# Patient Record
Sex: Female | Born: 1950 | Race: Black or African American | Hispanic: No | Marital: Married | Smoking: Never smoker
Health system: Southern US, Community
[De-identification: ages and names within clinical notes are randomized; demographics above are authoritative.]

## PROBLEM LIST (undated history)

## (undated) DIAGNOSIS — I10 Essential (primary) hypertension: Secondary | ICD-10-CM

## (undated) DIAGNOSIS — I4891 Unspecified atrial fibrillation: Secondary | ICD-10-CM

## (undated) DIAGNOSIS — Z952 Presence of prosthetic heart valve: Secondary | ICD-10-CM

## (undated) DIAGNOSIS — R011 Cardiac murmur, unspecified: Secondary | ICD-10-CM

## (undated) DIAGNOSIS — I517 Cardiomegaly: Secondary | ICD-10-CM

## (undated) DIAGNOSIS — I5022 Chronic systolic (congestive) heart failure: Secondary | ICD-10-CM

## (undated) DIAGNOSIS — I00 Rheumatic fever without heart involvement: Secondary | ICD-10-CM

## (undated) DIAGNOSIS — Z8619 Personal history of other infectious and parasitic diseases: Secondary | ICD-10-CM

## (undated) DIAGNOSIS — I513 Intracardiac thrombosis, not elsewhere classified: Secondary | ICD-10-CM

## (undated) HISTORY — DX: Intracardiac thrombosis, not elsewhere classified: I51.3

## (undated) HISTORY — DX: Rheumatic fever without heart involvement: I00

## (undated) HISTORY — DX: Chronic systolic (congestive) heart failure: I50.22

## (undated) HISTORY — DX: Essential (primary) hypertension: I10

## (undated) HISTORY — DX: Personal history of other infectious and parasitic diseases: Z86.19

## (undated) HISTORY — DX: Presence of prosthetic heart valve: Z95.2

## (undated) HISTORY — DX: Cardiomegaly: I51.7

---

## 1992-04-12 DIAGNOSIS — Z952 Presence of prosthetic heart valve: Secondary | ICD-10-CM

## 1992-04-12 HISTORY — PX: MITRAL VALVE REPLACEMENT: SHX147

## 1992-04-12 HISTORY — DX: Presence of prosthetic heart valve: Z95.2

## 1997-10-15 ENCOUNTER — Ambulatory Visit: Admission: RE | Admit: 1997-10-15 | Discharge: 1997-10-15 | Payer: Self-pay

## 1997-11-14 ENCOUNTER — Encounter (HOSPITAL_COMMUNITY): Admission: RE | Admit: 1997-11-14 | Discharge: 1998-02-06 | Payer: Self-pay

## 1998-02-06 ENCOUNTER — Encounter (HOSPITAL_COMMUNITY): Admission: RE | Admit: 1998-02-06 | Discharge: 1998-05-01 | Payer: Self-pay

## 1998-05-01 ENCOUNTER — Encounter (HOSPITAL_COMMUNITY): Admission: RE | Admit: 1998-05-01 | Discharge: 1998-07-30 | Payer: Self-pay

## 1998-08-31 ENCOUNTER — Inpatient Hospital Stay (HOSPITAL_COMMUNITY): Admission: EM | Admit: 1998-08-31 | Discharge: 1998-09-05 | Payer: Self-pay | Admitting: Cardiology

## 2001-01-02 ENCOUNTER — Inpatient Hospital Stay (HOSPITAL_COMMUNITY): Admission: RE | Admit: 2001-01-02 | Discharge: 2001-01-06 | Payer: Self-pay | Admitting: Oral and Maxillofacial Surgery

## 2001-01-02 ENCOUNTER — Encounter: Payer: Self-pay | Admitting: Cardiology

## 2005-11-15 ENCOUNTER — Ambulatory Visit: Payer: Self-pay | Admitting: Internal Medicine

## 2005-11-30 ENCOUNTER — Ambulatory Visit: Payer: Self-pay | Admitting: Internal Medicine

## 2005-12-15 ENCOUNTER — Ambulatory Visit: Payer: Self-pay | Admitting: Internal Medicine

## 2006-01-14 ENCOUNTER — Ambulatory Visit: Payer: Self-pay | Admitting: Internal Medicine

## 2006-02-15 ENCOUNTER — Ambulatory Visit: Payer: Self-pay | Admitting: Internal Medicine

## 2006-03-02 ENCOUNTER — Ambulatory Visit: Payer: Self-pay | Admitting: Internal Medicine

## 2006-03-31 ENCOUNTER — Ambulatory Visit: Payer: Self-pay | Admitting: Internal Medicine

## 2006-05-03 ENCOUNTER — Ambulatory Visit: Payer: Self-pay | Admitting: Internal Medicine

## 2006-05-18 ENCOUNTER — Ambulatory Visit: Payer: Self-pay | Admitting: Internal Medicine

## 2006-06-16 ENCOUNTER — Ambulatory Visit: Payer: Self-pay | Admitting: Internal Medicine

## 2006-07-20 ENCOUNTER — Ambulatory Visit: Payer: Self-pay | Admitting: Internal Medicine

## 2006-08-03 ENCOUNTER — Ambulatory Visit: Payer: Self-pay | Admitting: Internal Medicine

## 2006-08-29 ENCOUNTER — Ambulatory Visit: Payer: Self-pay | Admitting: Internal Medicine

## 2006-09-26 ENCOUNTER — Encounter: Payer: Self-pay | Admitting: Internal Medicine

## 2006-09-26 DIAGNOSIS — I10 Essential (primary) hypertension: Secondary | ICD-10-CM | POA: Insufficient documentation

## 2006-09-26 DIAGNOSIS — Z8679 Personal history of other diseases of the circulatory system: Secondary | ICD-10-CM

## 2006-09-28 ENCOUNTER — Ambulatory Visit: Payer: Self-pay | Admitting: Internal Medicine

## 2006-10-26 ENCOUNTER — Ambulatory Visit: Payer: Self-pay | Admitting: Internal Medicine

## 2006-11-03 ENCOUNTER — Telehealth: Payer: Self-pay | Admitting: Internal Medicine

## 2006-11-09 ENCOUNTER — Ambulatory Visit: Payer: Self-pay | Admitting: Internal Medicine

## 2006-11-09 DIAGNOSIS — Z954 Presence of other heart-valve replacement: Secondary | ICD-10-CM | POA: Insufficient documentation

## 2006-11-09 LAB — CONVERTED CEMR LAB
INR: 2.5
Prothrombin Time: 19.1 s

## 2006-11-30 ENCOUNTER — Ambulatory Visit: Payer: Self-pay | Admitting: Internal Medicine

## 2006-11-30 LAB — CONVERTED CEMR LAB
INR: 3
Prothrombin Time: 20.9 s

## 2007-01-03 ENCOUNTER — Ambulatory Visit: Payer: Self-pay | Admitting: Internal Medicine

## 2007-01-03 LAB — CONVERTED CEMR LAB
INR: 2.5
Prothrombin Time: 19.2 s

## 2007-01-31 ENCOUNTER — Ambulatory Visit: Payer: Self-pay | Admitting: Internal Medicine

## 2007-01-31 LAB — CONVERTED CEMR LAB
INR: 2.1
Prothrombin Time: 17.8 s

## 2007-02-13 ENCOUNTER — Ambulatory Visit: Payer: Self-pay | Admitting: Internal Medicine

## 2007-02-13 LAB — CONVERTED CEMR LAB
INR: 2.6
Prothrombin Time: 19.4 s

## 2007-03-08 ENCOUNTER — Ambulatory Visit: Payer: Self-pay | Admitting: Internal Medicine

## 2007-03-08 LAB — CONVERTED CEMR LAB
INR: 3.4
Prothrombin Time: 22.2 s

## 2007-03-14 ENCOUNTER — Telehealth: Payer: Self-pay | Admitting: Internal Medicine

## 2007-04-10 ENCOUNTER — Ambulatory Visit: Payer: Self-pay | Admitting: Internal Medicine

## 2007-04-10 LAB — CONVERTED CEMR LAB
INR: 3.3
Prothrombin Time: 22 s

## 2007-05-08 ENCOUNTER — Ambulatory Visit: Payer: Self-pay | Admitting: Internal Medicine

## 2007-05-08 LAB — CONVERTED CEMR LAB
INR: 3.6
Prothrombin Time: 23 s

## 2007-05-09 ENCOUNTER — Telehealth (INDEPENDENT_AMBULATORY_CARE_PROVIDER_SITE_OTHER): Payer: Self-pay | Admitting: *Deleted

## 2007-05-11 ENCOUNTER — Ambulatory Visit: Payer: Self-pay | Admitting: Internal Medicine

## 2007-05-22 ENCOUNTER — Ambulatory Visit: Payer: Self-pay | Admitting: Internal Medicine

## 2007-05-22 LAB — CONVERTED CEMR LAB
INR: 4
Prothrombin Time: 24.4 s

## 2007-05-23 ENCOUNTER — Encounter: Payer: Self-pay | Admitting: Internal Medicine

## 2007-05-23 ENCOUNTER — Ambulatory Visit: Payer: Self-pay

## 2007-06-02 ENCOUNTER — Ambulatory Visit: Payer: Self-pay | Admitting: Internal Medicine

## 2007-06-02 LAB — CONVERTED CEMR LAB
INR: 3.6
Prothrombin Time: 23 s

## 2007-06-13 ENCOUNTER — Ambulatory Visit: Payer: Self-pay | Admitting: Internal Medicine

## 2007-06-13 LAB — CONVERTED CEMR LAB
Calcium: 8.8 mg/dL (ref 8.4–10.5)
Chloride: 105 meq/L (ref 96–112)
GFR calc Af Amer: 111 mL/min
GFR calc non Af Amer: 91 mL/min
HDL: 32.1 mg/dL — ABNORMAL LOW (ref >39.0)
LDL Cholesterol: 115 mg/dL — ABNORMAL HIGH (ref 0–99)
Pro B Natriuretic peptide (BNP): 159 pg/mL — ABNORMAL HIGH (ref 0.0–100.0)
Sodium: 140 meq/L (ref 135–145)
Total CHOL/HDL Ratio: 5.5
VLDL: 30 mg/dL (ref 0–40)

## 2007-06-27 ENCOUNTER — Ambulatory Visit: Payer: Self-pay | Admitting: Internal Medicine

## 2007-06-27 LAB — CONVERTED CEMR LAB: Prothrombin Time: 23.7 s

## 2007-07-11 ENCOUNTER — Ambulatory Visit: Payer: Self-pay | Admitting: Internal Medicine

## 2007-07-11 LAB — CONVERTED CEMR LAB
INR: 3.4
Prothrombin Time: 22.4 s

## 2007-08-08 ENCOUNTER — Ambulatory Visit: Payer: Self-pay | Admitting: Internal Medicine

## 2007-08-08 LAB — CONVERTED CEMR LAB: Prothrombin Time: 17.7 s

## 2007-08-21 ENCOUNTER — Ambulatory Visit: Payer: Self-pay | Admitting: Internal Medicine

## 2007-09-21 ENCOUNTER — Ambulatory Visit: Payer: Self-pay | Admitting: Internal Medicine

## 2007-09-21 LAB — CONVERTED CEMR LAB
INR: 1.8
Prothrombin Time: 16.5 s

## 2007-09-26 ENCOUNTER — Ambulatory Visit: Payer: Self-pay | Admitting: Licensed Clinical Social Worker

## 2007-10-03 ENCOUNTER — Ambulatory Visit: Payer: Self-pay | Admitting: Licensed Clinical Social Worker

## 2007-10-09 ENCOUNTER — Ambulatory Visit: Payer: Self-pay | Admitting: Licensed Clinical Social Worker

## 2007-10-17 ENCOUNTER — Ambulatory Visit: Payer: Self-pay | Admitting: Licensed Clinical Social Worker

## 2007-10-19 ENCOUNTER — Ambulatory Visit: Payer: Self-pay | Admitting: Internal Medicine

## 2007-10-19 LAB — CONVERTED CEMR LAB: Prothrombin Time: 17.7 s

## 2007-10-24 ENCOUNTER — Ambulatory Visit: Payer: Self-pay | Admitting: Licensed Clinical Social Worker

## 2007-11-14 ENCOUNTER — Ambulatory Visit: Payer: Self-pay | Admitting: Licensed Clinical Social Worker

## 2007-11-21 ENCOUNTER — Ambulatory Visit: Payer: Self-pay | Admitting: Internal Medicine

## 2007-11-21 LAB — CONVERTED CEMR LAB: INR: 1.7

## 2007-12-12 ENCOUNTER — Ambulatory Visit: Payer: Self-pay | Admitting: Licensed Clinical Social Worker

## 2007-12-19 ENCOUNTER — Ambulatory Visit: Payer: Self-pay | Admitting: Internal Medicine

## 2008-01-16 ENCOUNTER — Ambulatory Visit: Payer: Self-pay | Admitting: Internal Medicine

## 2008-01-19 ENCOUNTER — Ambulatory Visit: Payer: Self-pay | Admitting: Licensed Clinical Social Worker

## 2008-02-13 ENCOUNTER — Ambulatory Visit: Payer: Self-pay | Admitting: Internal Medicine

## 2008-02-13 DIAGNOSIS — I059 Rheumatic mitral valve disease, unspecified: Secondary | ICD-10-CM

## 2008-02-13 LAB — CONVERTED CEMR LAB
INR: 1.8
Prothrombin Time: 16.5 s

## 2008-02-27 ENCOUNTER — Ambulatory Visit: Payer: Self-pay | Admitting: Internal Medicine

## 2008-02-27 LAB — CONVERTED CEMR LAB
INR: 2.2
Prothrombin Time: 18.3 s

## 2008-03-19 ENCOUNTER — Ambulatory Visit: Payer: Self-pay | Admitting: Internal Medicine

## 2008-03-19 LAB — CONVERTED CEMR LAB
INR: 2.2
Prothrombin Time: 18.1 s

## 2008-04-10 ENCOUNTER — Ambulatory Visit: Payer: Self-pay | Admitting: Internal Medicine

## 2008-04-15 ENCOUNTER — Encounter: Payer: Self-pay | Admitting: Internal Medicine

## 2008-05-09 ENCOUNTER — Ambulatory Visit: Payer: Self-pay | Admitting: Internal Medicine

## 2008-06-11 ENCOUNTER — Ambulatory Visit: Payer: Self-pay | Admitting: Internal Medicine

## 2008-07-19 ENCOUNTER — Ambulatory Visit: Payer: Self-pay | Admitting: Internal Medicine

## 2008-07-19 LAB — CONVERTED CEMR LAB
INR: 2.6
Prothrombin Time: 19.7 s

## 2008-08-20 ENCOUNTER — Ambulatory Visit: Payer: Self-pay | Admitting: Internal Medicine

## 2008-08-20 LAB — CONVERTED CEMR LAB
INR: 2.3
Prothrombin Time: 18.7 s

## 2008-09-12 ENCOUNTER — Ambulatory Visit: Payer: Self-pay | Admitting: Internal Medicine

## 2008-09-12 LAB — CONVERTED CEMR LAB: Prothrombin Time: 13.5 s

## 2008-09-20 ENCOUNTER — Ambulatory Visit: Payer: Self-pay | Admitting: Internal Medicine

## 2008-10-04 ENCOUNTER — Ambulatory Visit: Payer: Self-pay | Admitting: Internal Medicine

## 2008-10-04 LAB — CONVERTED CEMR LAB
Alkaline Phosphatase: 61 units/L (ref 39–117)
Basophils Absolute: 0 10*3/uL (ref 0.0–0.1)
Basophils Relative: 0.2 % (ref 0.0–3.0)
Bilirubin Urine: NEGATIVE
Bilirubin, Direct: 0.1 mg/dL (ref 0.0–0.3)
CO2: 29 meq/L (ref 19–32)
Calcium: 8.9 mg/dL (ref 8.4–10.5)
Cholesterol: 239 mg/dL — ABNORMAL HIGH (ref 0–200)
Creatinine, Ser: 0.6 mg/dL (ref 0.4–1.2)
Eosinophils Absolute: 0.1 10*3/uL (ref 0.0–0.7)
Ketones, urine, test strip: NEGATIVE
Lymphocytes Relative: 39.8 % (ref 12.0–46.0)
MCHC: 34 g/dL (ref 30.0–36.0)
Neutrophils Relative %: 48.8 % (ref 43.0–77.0)
Prothrombin Time: 17.6 s
RBC: 4.21 M/uL (ref 3.87–5.11)
RDW: 12.4 % (ref 11.5–14.6)
Total Bilirubin: 1.1 mg/dL (ref 0.3–1.2)
Total CHOL/HDL Ratio: 5
Triglycerides: 95 mg/dL (ref 0.0–149.0)
Urobilinogen, UA: 0.2

## 2008-10-11 ENCOUNTER — Ambulatory Visit: Payer: Self-pay | Admitting: Internal Medicine

## 2008-10-11 ENCOUNTER — Encounter: Payer: Self-pay | Admitting: *Deleted

## 2008-10-11 DIAGNOSIS — E785 Hyperlipidemia, unspecified: Secondary | ICD-10-CM

## 2008-10-11 LAB — CONVERTED CEMR LAB
INR: 2.8
Prothrombin Time: 20.2 s

## 2008-11-05 ENCOUNTER — Ambulatory Visit: Payer: Self-pay | Admitting: Internal Medicine

## 2008-11-06 ENCOUNTER — Encounter: Payer: Self-pay | Admitting: *Deleted

## 2008-11-06 LAB — CONVERTED CEMR LAB: Fecal Occult Bld: NEGATIVE

## 2008-11-08 ENCOUNTER — Ambulatory Visit: Payer: Self-pay | Admitting: Internal Medicine

## 2008-11-08 LAB — CONVERTED CEMR LAB: Prothrombin Time: 18.1 s

## 2008-11-22 ENCOUNTER — Ambulatory Visit: Payer: Self-pay | Admitting: Internal Medicine

## 2008-12-20 ENCOUNTER — Ambulatory Visit: Payer: Self-pay | Admitting: Internal Medicine

## 2008-12-20 LAB — CONVERTED CEMR LAB
INR: 2.3
Prothrombin Time: 18.4 s

## 2009-01-27 ENCOUNTER — Ambulatory Visit: Payer: Self-pay | Admitting: Internal Medicine

## 2009-01-27 LAB — CONVERTED CEMR LAB: INR: 2.8

## 2009-02-10 ENCOUNTER — Ambulatory Visit: Payer: Self-pay | Admitting: Internal Medicine

## 2009-02-17 ENCOUNTER — Telehealth: Payer: Self-pay | Admitting: *Deleted

## 2009-03-03 ENCOUNTER — Ambulatory Visit: Payer: Self-pay | Admitting: Internal Medicine

## 2009-03-03 LAB — CONVERTED CEMR LAB: Prothrombin Time: 16.4 s

## 2009-04-03 ENCOUNTER — Ambulatory Visit: Payer: Self-pay | Admitting: Internal Medicine

## 2009-04-24 ENCOUNTER — Ambulatory Visit: Payer: Self-pay | Admitting: Internal Medicine

## 2009-04-24 LAB — CONVERTED CEMR LAB: Prothrombin Time: 21.2 s

## 2009-05-29 ENCOUNTER — Ambulatory Visit: Payer: Self-pay | Admitting: Internal Medicine

## 2009-05-29 LAB — CONVERTED CEMR LAB
INR: 2.5
Prothrombin Time: 19.3 s

## 2009-06-26 ENCOUNTER — Ambulatory Visit: Payer: Self-pay | Admitting: Internal Medicine

## 2009-06-26 LAB — CONVERTED CEMR LAB

## 2009-07-25 ENCOUNTER — Ambulatory Visit: Payer: Self-pay | Admitting: Internal Medicine

## 2009-08-22 ENCOUNTER — Ambulatory Visit: Payer: Self-pay | Admitting: Internal Medicine

## 2009-08-22 LAB — CONVERTED CEMR LAB: INR: 1.2

## 2009-08-25 ENCOUNTER — Ambulatory Visit: Payer: Self-pay | Admitting: Internal Medicine

## 2009-08-25 LAB — CONVERTED CEMR LAB
INR: 2
Prothrombin Time: 17.5 s

## 2009-09-02 ENCOUNTER — Ambulatory Visit: Payer: Self-pay | Admitting: Internal Medicine

## 2009-09-02 LAB — CONVERTED CEMR LAB
INR: 3.2
Prothrombin Time: 21.6 s

## 2009-09-30 ENCOUNTER — Ambulatory Visit: Payer: Self-pay | Admitting: Internal Medicine

## 2009-10-28 ENCOUNTER — Ambulatory Visit: Payer: Self-pay | Admitting: Internal Medicine

## 2009-12-01 ENCOUNTER — Ambulatory Visit: Payer: Self-pay | Admitting: Internal Medicine

## 2009-12-29 ENCOUNTER — Ambulatory Visit: Payer: Self-pay | Admitting: Internal Medicine

## 2010-01-07 LAB — CONVERTED CEMR LAB
Albumin: 3.8 g/dL (ref 3.5–5.2)
Alkaline Phosphatase: 57 units/L (ref 39–117)
BUN: 11 mg/dL (ref 6–23)
Basophils Absolute: 0.1 10*3/uL (ref 0.0–0.1)
Bilirubin Urine: NEGATIVE
Bilirubin, Direct: 0.1 mg/dL (ref 0.0–0.3)
CO2: 31 meq/L (ref 19–32)
Calcium: 9.3 mg/dL (ref 8.4–10.5)
Cholesterol: 251 mg/dL — ABNORMAL HIGH (ref 0–200)
Creatinine, Ser: 0.6 mg/dL (ref 0.4–1.2)
Eosinophils Absolute: 0.1 10*3/uL (ref 0.0–0.7)
Glucose, Bld: 86 mg/dL (ref 70–99)
HDL: 54.2 mg/dL (ref >39.00)
Ketones, ur: NEGATIVE mg/dL
Lymphocytes Relative: 36.6 % (ref 12.0–46.0)
MCHC: 33.8 g/dL (ref 30.0–36.0)
MCV: 94 fL (ref 78.0–100.0)
Monocytes Absolute: 0.6 10*3/uL (ref 0.1–1.0)
Neutrophils Relative %: 53.4 % (ref 43.0–77.0)
Nitrite: NEGATIVE
RDW: 13.4 % (ref 11.5–14.6)
Total CHOL/HDL Ratio: 5
Total Protein, Urine: NEGATIVE mg/dL
Triglycerides: 107 mg/dL (ref 0.0–149.0)
pH: 6 (ref 5.0–8.0)

## 2010-01-13 ENCOUNTER — Ambulatory Visit: Payer: Self-pay | Admitting: Internal Medicine

## 2010-01-20 ENCOUNTER — Ambulatory Visit: Payer: Self-pay | Admitting: Internal Medicine

## 2010-01-22 ENCOUNTER — Ambulatory Visit: Payer: Self-pay | Admitting: Internal Medicine

## 2010-01-22 LAB — CONVERTED CEMR LAB
Nitrite: NEGATIVE
Specific Gravity, Urine: 1.01 (ref 1.000–1.030)
Urine Glucose: NEGATIVE mg/dL
Urobilinogen, UA: 0.2 (ref 0.0–1.0)

## 2010-01-23 ENCOUNTER — Encounter: Payer: Self-pay | Admitting: Internal Medicine

## 2010-02-16 ENCOUNTER — Encounter: Payer: Self-pay | Admitting: *Deleted

## 2010-02-26 ENCOUNTER — Telehealth: Payer: Self-pay | Admitting: *Deleted

## 2010-03-13 ENCOUNTER — Ambulatory Visit: Payer: Self-pay | Admitting: Internal Medicine

## 2010-03-13 LAB — CONVERTED CEMR LAB: INR: 1.9

## 2010-03-27 ENCOUNTER — Ambulatory Visit: Payer: Self-pay | Admitting: Internal Medicine

## 2010-04-14 ENCOUNTER — Ambulatory Visit
Admission: RE | Admit: 2010-04-14 | Discharge: 2010-04-14 | Payer: Self-pay | Source: Home / Self Care | Attending: Internal Medicine | Admitting: Internal Medicine

## 2010-05-02 ENCOUNTER — Encounter: Payer: Self-pay | Admitting: Gastroenterology

## 2010-05-12 NOTE — Assessment & Plan Note (Signed)
Summary: PT/CJR   Nurse Visit   Allergies: 1)  Sulfamethoxazole (Sulfamethoxazole) Laboratory Results   Blood Tests   Date/Time Received: October 28, 2009 4:30 PM  Date/Time Reported: October 28, 2009 4:30 PM    INR: 3.4   (Normal Range: 0.88-1.12   Therap INR: 2.0-3.5) Comments: Wynona Canes, CMA  October 28, 2009 4:30 PM     Orders Added: 1)  Fingerstick [36416] 2)  Protime [[id number]]  Laboratory Results   Blood Tests      INR: 3.4   (Normal Range: 0.88-1.12   Therap INR: 2.0-3.5) Comments: Wynona Canes, CMA  October 28, 2009 4:30 PM       ANTICOAGULATION RECORD PREVIOUS REGIMEN & LAB RESULTS Anticoagulation Diagnosis:  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 on  05/09/2008  NEW REGIMEN & LAB RESULTS Current INR: 3.4 Regimen: Same Dose  (no change)       Repeat testing in: 4 weeks MEDICATIONS COUMADIN 5 MG TABS (WARFARIN SODIUM) Take 1.5 tabs once daily or as directed COUMADIN 7.5 MG TABS (WARFARIN SODIUM) Take as directed DIOVAN HCT 80-12.5 MG TABS (VALSARTAN-HYDROCHLOROTHIAZIDE) 1 by mouth once daily AMOXICILLIN 500 MG  CAPS (AMOXICILLIN) take #4     1 hour pre procedure. COUMADIN 10 MG TABS (WARFARIN SODIUM) as directed   Anticoagulation Visit Questionnaire      Coumadin dose missed/changed:  No      Abnormal Bleeding Symptoms:  No   Any diet changes including alcohol intake, vegetables or greens since the last visit:  No Any illnesses or hospitalizations since the last visit:  No Any signs of clotting since the last visit (including chest discomfort, dizziness, shortness of breath, arm tingling, slurred speech, swelling or redness in leg):  No

## 2010-05-12 NOTE — Assessment & Plan Note (Signed)
Summary: pt//ccm   Nurse Visit   Allergies: 1)  Sulfamethoxazole (Sulfamethoxazole) Laboratory Results   Blood Tests   Date/Time Received: Aug 18, 2009 4:56 PM  Date/Time Reported: Aug 18, 2009 4:56 PM   PT: 19.2 s   (Normal Range: 10.6-13.4)  INR: 2.5   (Normal Range: 0.88-1.12   Therap INR: 2.0-3.5) Comments: Wynona Canes, CMA  Aug 18, 2009 4:56 PM      Orders Added: 1)  Est. 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 7.5mg  other days on  05/29/2009 Previous Regimen:  same on  04/24/2009 Previous Coagulation Comments:  Take 10mg  only for 2 days, then 7.5mg  once daily. on  05/09/2008  NEW REGIMEN & LAB RESULTS Current INR: 2.5 Regimen: same  (no change)       Repeat testing in: 4 weeks MEDICATIONS COUMADIN 5 MG TABS (WARFARIN SODIUM) Take 1.5 tabs once daily or as directed COUMADIN 7.5 MG TABS (WARFARIN SODIUM) Take as directed DIOVAN HCT 80-12.5 MG TABS (VALSARTAN-HYDROCHLOROTHIAZIDE) 1 by mouth once daily AMOXICILLIN 500 MG  CAPS (AMOXICILLIN) take #4     1 hour pre procedure. COUMADIN 10 MG TABS (WARFARIN SODIUM) as directed   Anticoagulation Visit Questionnaire      Coumadin dose missed/changed:  No      Abnormal Bleeding Symptoms:  No   Any diet changes including alcohol intake, vegetables or greens since the last visit:  No Any illnesses or hospitalizations since the last visit:  No Any signs of clotting since the last visit (including chest discomfort, dizziness, shortness of  breath, arm tingling, slurred speech, swelling or redness in leg):  No

## 2010-05-12 NOTE — Assessment & Plan Note (Signed)
Summary: pt/njr   Nurse Visit   Vital Signs:  Patient profile:   60 year old female Menstrual status:  postmenopausal BP sitting:   120 / 78  Allergies: 1)  Sulfamethoxazole (Sulfamethoxazole) Laboratory Results   Blood Tests     PT: 21.2 s   (Normal Range: 10.6-13.4)  INR: 3.1   (Normal Range: 0.88-1.12   Therap INR: 2.0-3.5) Comments: Rita Ohara  April 24, 2009 4:45 PM     Orders Added: 1)  Est. 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 for 2 days only then resume on  03/03/2009 Previous Coagulation Comments:  Take 10mg  only for 2 days, then 7.5mg  once daily. on  05/09/2008  NEW REGIMEN & LAB RESULTS Current INR: 3.1 Regimen: same  Repeat testing in: 1 month  Anticoagulation Visit Questionnaire Coumadin dose missed/changed:  No Abnormal Bleeding Symptoms:  No  Any diet changes including alcohol intake, vegetables or greens since the last visit:  No Any illnesses or hospitalizations since the last visit:  No Any signs of clotting since the last visit (including chest discomfort, dizziness, shortness of breath, arm tingling, slurred speech, swelling or redness in leg):  No  MEDICATIONS COUMADIN 5 MG TABS (WARFARIN SODIUM) Take 1.5 tabs once daily or as directed COUMADIN 7.5 MG TABS (WARFARIN SODIUM) Take as directed DIOVAN HCT 80-12.5 MG TABS (VALSARTAN-HYDROCHLOROTHIAZIDE) 1 by mouth once daily AMOXICILLIN 500 MG  CAPS (AMOXICILLIN) take #4     1 hour pre procedure. COUMADIN 10 MG TABS (WARFARIN SODIUM) as directed      Vital Signs:  Patient Profile:   60 year old female Height:     64.5 inches (163.83 cm) BP sitting:   120 / 78  (left arm)

## 2010-05-12 NOTE — Assessment & Plan Note (Signed)
Summary: ROA/PT/RCD   Vital Signs:  Patient profile:   60 year old female Menstrual status:  postmenopausal Height:      64.5 inches Weight:      180 pounds BMI:     30.53 Pulse rate:   66 / minute BP sitting:   110 / 70  (left arm) Cuff size:   regular  Vitals Entered By: Romualdo Bolk, CMA (AAMA) (December 01, 2009 4:06 PM) CC: Follow-up visit , Hypertension Management   History of Present Illness: Rachel Cantu comes in today  for  visit  yearly check   and hasnt had reg ov or cpx in the past year but  has done well and monitoring her coumadin faithfully  with usually in range without need for change in dose  . No bleeding or  related concerns . HT: Taking meds and BP usually very good. No se of meds. MVR: sees Dr Tenny Craw yearly and to be seen in fall. No cp sob  sig edema .   Lipids :  trying to eat healthier but weight is picking up.   taking OJ q day on direction of  her elderly mom who is well in to her 13s.  GYne Sees dr Seymour Bars yearly  utd.  Hypertension History:      She denies headache, chest pain, palpitations, dyspnea with exertion, orthopnea, PND, peripheral edema, visual symptoms, neurologic problems, syncope, and side effects from treatment.  She notes no problems with any antihypertensive medication side effects.        Positive major cardiovascular risk factors include female age 62 years old or older, hyperlipidemia, and hypertension.  Negative major cardiovascular risk factors include non-tobacco-user status.     Preventive Screening-Counseling & Management  Alcohol-Tobacco     Alcohol drinks/day: 0     Smoking Status: never  Caffeine-Diet-Exercise     Caffeine use/day: 0     Does Patient Exercise: yes     Type of exercise: sporatic  Hep-HIV-STD-Contraception     Dental Visit-last 6 months no  Safety-Violence-Falls     Seat Belt Use: yes     Smoke Detectors: yes  Current Medications (verified): 1)  Coumadin 5 Mg Tabs (Warfarin Sodium) .... Take 1.5  Tabs Once Daily or As Directed 2)  Coumadin 7.5 Mg Tabs (Warfarin Sodium) .... Take As Directed 3)  Diovan Hct 80-12.5 Mg Tabs (Valsartan-Hydrochlorothiazide) .Marland Kitchen.. 1 By Mouth Once Daily 4)  Coumadin 10 Mg Tabs (Warfarin Sodium) .... As Directed  Allergies (verified): 1)  Sulfamethoxazole (Sulfamethoxazole)  Past History:  Care Management: Cardiology: Tenny Craw Cardiovascular Surgery: Righter Gynecology: Tamera Reason OPTHAL:Hecker  Family History: Family History of CAD Female 1st degree relative <50 father 18 Mom alive in her late 73s  Social History: Divorced Never Smoked hh of 2 no pets  Dental Care w/in 6 mos.:  no  Review of Systems  The patient denies anorexia, fever, vision loss, decreased hearing, chest pain, syncope, dyspnea on exertion, peripheral edema, headaches, hemoptysis, abdominal pain, melena, hematochezia, severe indigestion/heartburn, hematuria, muscle weakness, transient blindness, difficulty walking, depression, abnormal bleeding, enlarged lymph nodes, and angioedema.    Physical Exam  General:  alert, well-developed, well-nourished, and well-hydrated.   Head:  normocephalic and atraumatic.   Eyes:  vision grossly intact.   Ears:  R ear normal and L ear normal.   Nose:  no external deformity and no external erythema.   Mouth:  good dentition and pharynx pink and moist.   Neck:  No deformities, masses, or tenderness  noted. Chest Wall:  well healed scars no other deformity Lungs:  Normal respiratory effort, chest expands symmetrically. Lungs are clear to auscultation, no crackles or wheezes.no dullness.   Heart:  normal rate, regular rhythm, and no murmur.  crisp valve soundsno lifts.   Abdomen:  Bowel sounds positive,abdomen soft and non-tender without masses, organomegaly or   noted. Pulses:  pulses intact without delay   Extremities:  no clubbing cyanosis or edema  Neurologic:  Pt is A&Ox3,affect,speech,memory,attention,&motor skills appear intact. gait normal.    Skin:  turgor normal, color normal, no suspicious lesions, no ecchymoses, no petechiae, and no purpura.   Cervical Nodes:  No lymphadenopathy noted Psych:  Oriented X3, normally interactive, good eye contact, not anxious appearing, and not depressed appearing.  verbal and talkative.   Impression & Recommendations:  Problem # 1:  HYPERTENSION (ICD-401.9)  due for labs  Her updated medication list for this problem includes:    Diovan Hct 80-12.5 Mg Tabs (Valsartan-hydrochlorothiazide) .Marland Kitchen... 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 Take 1.5 tabs once daily or as directed 2)  Coumadin 7.5 Mg Tabs (Warfarin sodium) .... Take as directed 3)  Diovan Hct 80-12.5 Mg Tabs (Valsartan-hydrochlorothiazide) .Marland Kitchen.. 1 by mouth once daily 4)  Coumadin 10 Mg Tabs (Warfarin sodium) .... As directed  Hypertension Assessment/Plan:      The patient's hypertensive risk group is category B: At least one risk factor (excluding diabetes) with no target organ damage.  Her calculated 10 year risk of coronary heart disease is 6 %.  Today's blood  pressure is 110/70.  Her blood pressure goal is < 140/90.  Patient Instructions: 1)  schedule  CPX labs    v70.0  401.9   272.4  2)  will let  you know results .  if doing well cpx in a year and see your  specialist  as discussed. 3)  continue monthly INRs.  4)  You need to use up 3500 calories more than intake to lose one pound of body weight.    will send copy labs to Dr Seymour Bars and Dr Tenny Craw.   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 on  05/09/2008  NEW REGIMEN & LAB RESULTS Current INR: 3.3 Regimen: Same Dose  (no change)       Repeat testing in: 4 weeks MEDICATIONS COUMADIN 5 MG TABS (WARFARIN SODIUM) Take 1.5 tabs once daily or as directed COUMADIN 7.5 MG TABS (WARFARIN SODIUM) Take as directed DIOVAN HCT 80-12.5 MG TABS (VALSARTAN-HYDROCHLOROTHIAZIDE) 1 by mouth once daily COUMADIN 10 MG TABS (WARFARIN SODIUM) as directed   Anticoagulation Visit Questionnaire      Coumadin dose missed/changed:  No      Abnormal Bleeding Symptoms:  No   Any diet changes including alcohol intake, vegetables or greens since the last visit:  No Any illnesses or hospitalizations since the last visit:  No Any signs of clotting since the last visit (including chest discomfort, dizziness, shortness of breath, arm tingling, slurred speech,  swelling or redness in leg):  No

## 2010-05-12 NOTE — Letter (Signed)
Summary: Generic Letter  [place] [facility]  [address] [place], [place] [address]   Phone: [phone]  Fax: [phone]    02/16/2010  Rachel Cantu [address] [place], [place]  [address]  Dear [name],  We have tried to call you about your urine culture. It showed some bacteria but no predominant germ. So you have no Urinary Tract Infection. If you have any other questions, please give [place] a call at [phone].         Sincerely,   Tor Netters, CMA (AAMA)

## 2010-05-12 NOTE — Assessment & Plan Note (Signed)
Summary: pt/Rachel Cantu   Nurse Visit   Allergies: 1)  Sulfamethoxazole (Sulfamethoxazole) Laboratory Results   Blood Tests   Date/Time Received: September 30, 2009 4:17 PM  Date/Time Reported: September 30, 2009 4:17 PM    INR: 3.2   (Normal Range: 0.88-1.12   Therap INR: 2.0-3.5) Comments: Wynona Canes, CMA  September 30, 2009 4:18 PM     Orders Added: 1)  Est. 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 on  05/09/2008  NEW REGIMEN & LAB RESULTS Current INR: 3.2 Regimen: Same Dose       Repeat testing in: 4 weeks MEDICATIONS COUMADIN 5 MG TABS (WARFARIN SODIUM) Take 1.5 tabs once daily or as directed COUMADIN 7.5 MG TABS (WARFARIN SODIUM) Take as directed DIOVAN HCT 80-12.5 MG TABS (VALSARTAN-HYDROCHLOROTHIAZIDE) 1 by mouth once daily AMOXICILLIN 500 MG  CAPS (AMOXICILLIN) take #4     1 hour pre procedure. COUMADIN 10 MG TABS (WARFARIN SODIUM) as directed   Anticoagulation Visit Questionnaire      Coumadin dose missed/changed:  No      Abnormal Bleeding Symptoms:  No   Any diet changes including alcohol intake, vegetables or greens since the last visit:  No Any illnesses or hospitalizations since the last visit:  No Any signs of clotting since the last visit (including chest discomfort, dizziness, shortness of breath, arm tingling, slurred speech, swelling or redness  in leg):  No

## 2010-05-12 NOTE — Assessment & Plan Note (Signed)
Summary: PT // RS   Nurse Visit   Allergies: 1)  Sulfamethoxazole (Sulfamethoxazole) Laboratory Results   Blood Tests   Date/Time Received: Sep 02, 2009 4:59 PM  Date/Time Reported: Sep 02, 2009 4:59 PM   PT: 21.6 s   (Normal Range: 10.6-13.4)  INR: 3.2   (Normal Range: 0.88-1.12   Therap INR: 2.0-3.5) Comments: Wynona Canes, CMA  Sep 02, 2009 4:59 PM     Orders Added: 1)  Est. 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 on  05/09/2008  NEW REGIMEN & LAB RESULTS Current INR: 3.2 Current Coumadin Dose(mg): 10mg  on wed,sun 7.5mg  other days Regimen: 10mg  on wed & fri 7.5mg  on other days  (no change)       Repeat testing in: 4 weeks MEDICATIONS COUMADIN 5 MG TABS (WARFARIN SODIUM) Take 1.5 tabs once daily or as directed COUMADIN 7.5 MG TABS (WARFARIN SODIUM) Take as directed DIOVAN HCT 80-12.5 MG TABS (VALSARTAN-HYDROCHLOROTHIAZIDE) 1 by mouth once daily AMOXICILLIN 500 MG  CAPS (AMOXICILLIN) take #4     1 hour pre procedure. COUMADIN 10 MG TABS (WARFARIN SODIUM) as directed   Anticoagulation Visit Questionnaire      Coumadin dose missed/changed:  No      Abnormal Bleeding Symptoms:  No   Any diet changes including alcohol intake, vegetables or greens since the last visit:  No Any illnesses or hospitalizations since the last  visit:  No Any signs of clotting since the last visit (including chest discomfort, dizziness, shortness of breath, arm tingling, slurred speech, swelling or redness in leg):  No

## 2010-05-12 NOTE — Progress Notes (Signed)
Summary: INR  Phone Note Outgoing Call   Call placed by: Rita Ohara Call placed to: Patient Details for Reason: INR Summary of Call: Called patient to remind her she needs INR checked.  Follow-up for Phone Call        Pt aware of this. Pt to come in on 12/2 in the afternoon. Follow-up by: Romualdo Bolk, CMA Duncan Dull),  March 10, 2010 4:19 PM

## 2010-05-12 NOTE — Assessment & Plan Note (Signed)
Summary: pt/njr   Nurse Visit   Allergies: 1)  Sulfamethoxazole (Sulfamethoxazole) Laboratory Results   Blood Tests   Date/Time Received: June 26, 2009 4:33 PM  Date/Time Reported: June 26, 2009 4:32 PM   PT: 22.0 s   (Normal Range: 10.6-13.4)  INR: 3.3   (Normal Range: 0.88-1.12   Therap INR: 2.0-3.5) Comments: Wynona Canes, CMA  June 26, 2009 4:33 PM     Orders Added: 1)  Est. 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 7.5mg  other days on  05/29/2009 Previous Regimen:  same on  04/24/2009 Previous Coagulation Comments:  Take 10mg  only for 2 days, then 7.5mg  once daily. on  05/09/2008  NEW REGIMEN & LAB RESULTS Current INR Goal Range: 2.5-3.5 Current INR: 3.3 Regimen: same  (no change)       Repeat testing in: 4 weeks MEDICATIONS COUMADIN 5 MG TABS (WARFARIN SODIUM) Take 1.5 tabs once daily or as directed COUMADIN 7.5 MG TABS (WARFARIN SODIUM) Take as directed DIOVAN HCT 80-12.5 MG TABS (VALSARTAN-HYDROCHLOROTHIAZIDE) 1 by mouth once daily AMOXICILLIN 500 MG  CAPS (AMOXICILLIN) take #4     1 hour pre procedure. COUMADIN 10 MG TABS (WARFARIN SODIUM) as directed   Anticoagulation Visit Questionnaire      Coumadin dose missed/changed:  No      Abnormal Bleeding Symptoms:  No   Any diet changes including alcohol intake, vegetables or greens since the last visit:  No Any illnesses or hospitalizations since the last visit:  No Any signs of clotting since the last visit (including chest  discomfort, dizziness, shortness of breath, arm tingling, slurred speech, swelling or redness in leg):  No

## 2010-05-12 NOTE — Assessment & Plan Note (Signed)
Summary: pt/ccm   Nurse Visit   Allergies: 1)  Sulfamethoxazole (Sulfamethoxazole) Laboratory Results   Blood Tests   Date/Time Received: Aug 22, 2009 3:49 PM  Date/Time Reported: Aug 22, 2009 3:49 PM   PT: 13.8 s   (Normal Range: 10.6-13.4)  INR: 1.2   (Normal Range: 0.88-1.12   Therap INR: 2.0-3.5) Comments: Wynona Canes, CMA  Aug 22, 2009 3:49 PM     Orders Added: 1)  Est. 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 7.5mg  other days on  05/29/2009 Previous Regimen:  same on  04/24/2009 Previous Coagulation Comments:  Take 10mg  only for 2 days, then 7.5mg  once daily. on  05/09/2008  NEW REGIMEN & LAB RESULTS Current INR: 1.2 Regimen: 10mg  on Fri,Sat,Sun       Repeat testing in: Monday MEDICATIONS COUMADIN 5 MG TABS (WARFARIN SODIUM) Take 1.5 tabs once daily or as directed COUMADIN 7.5 MG TABS (WARFARIN SODIUM) Take as directed DIOVAN HCT 80-12.5 MG TABS (VALSARTAN-HYDROCHLOROTHIAZIDE) 1 by mouth once daily AMOXICILLIN 500 MG  CAPS (AMOXICILLIN) take #4     1 hour pre procedure. COUMADIN 10 MG TABS (WARFARIN SODIUM) as directed   Anticoagulation Visit Questionnaire      Coumadin dose missed/changed:  No      Abnormal Bleeding Symptoms:  No   Any diet changes including alcohol intake, vegetables or greens since the last visit:  No Any illnesses or hospitalizations since the last visit:  No Any signs of clotting since the last visit (including chest discomfort, dizziness, shortness of  breath, arm tingling, slurred speech, swelling or redness in leg):  No

## 2010-05-12 NOTE — Assessment & Plan Note (Signed)
Summary: pt//ccm  Nurse Visit   Vital Signs:  Patient profile:   60 year old female Menstrual status:  postmenopausal BP sitting:   110 / 60  Allergies: 1)  Sulfamethoxazole (Sulfamethoxazole) Laboratory Results   Blood Tests   Date/Time Received: May 29, 2009 4:51 PM  Date/Time Reported: May 29, 2009 4:51 PM   PT: 19.3 s   (Normal Range: 10.6-13.4)  INR: 2.5   (Normal Range: 0.88-1.12   Therap INR: 2.0-3.5) Comments: Wynona Canes, CMA  May 29, 2009 4:51 PM     Orders Added: 1)  Est. 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 on  05/09/2008  NEW REGIMEN & LAB RESULTS Current INR: 2.5 Current Coumadin Dose(mg): 10mg  on sun & wed. 7.5mg  other days Regimen: same  (no change)       Repeat testing in: 4 weeks MEDICATIONS COUMADIN 5 MG TABS (WARFARIN SODIUM) Take 1.5 tabs once daily or as directed COUMADIN 7.5 MG TABS (WARFARIN SODIUM) Take as directed DIOVAN HCT 80-12.5 MG TABS (VALSARTAN-HYDROCHLOROTHIAZIDE) 1 by mouth once daily AMOXICILLIN 500 MG  CAPS (AMOXICILLIN) take #4     1 hour pre procedure. COUMADIN 10 MG TABS (WARFARIN SODIUM) as directed   Anticoagulation Visit Questionnaire      Coumadin dose missed/changed:  No      Abnormal Bleeding Symptoms:  No   Any diet changes including alcohol intake, vegetables or greens since the last  visit:  No Any illnesses or hospitalizations since the last visit:  No Any signs of clotting since the last visit (including chest discomfort, dizziness, shortness of breath, arm tingling, slurred speech, swelling or redness in leg):  No     Vital Signs:  Patient Profile:   60 year old female Height:     64.5 inches (163.83 cm) BP sitting:   110 / 60  (left arm)

## 2010-05-12 NOTE — Assessment & Plan Note (Signed)
Summary: pt/Rachel Cantu   Nurse Visit   Allergies: 1)  Sulfamethoxazole (Sulfamethoxazole) Laboratory Results   Blood Tests   Date/Time Received: Aug 25, 2009 5:05 PM  Date/Time Reported: Aug 25, 2009 5:05 PM   PT: 17.5 s   (Normal Range: 10.6-13.4)  INR: 2.0   (Normal Range: 0.88-1.12   Therap INR: 2.0-3.5) Comments: Wynona Canes, CMA  Aug 25, 2009 5:05 PM     Orders Added: 1)  Est. 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 7.5mg  other days on  05/29/2009 Previous Regimen:  10mg  on Fri,Sat,Sun on  08/22/2009 Previous Coagulation Comments:  Take 10mg  only for 2 days, then 7.5mg  once daily. on  05/09/2008  NEW REGIMEN & LAB RESULTS Current INR: 2.0 Current Coumadin Dose(mg): 10mg  for 4 days Regimen: 10mg  on wed & fri 7.5mg  on other days       Repeat testing in: Monday MEDICATIONS COUMADIN 5 MG TABS (WARFARIN SODIUM) Take 1.5 tabs once daily or as directed COUMADIN 7.5 MG TABS (WARFARIN SODIUM) Take as directed DIOVAN HCT 80-12.5 MG TABS (VALSARTAN-HYDROCHLOROTHIAZIDE) 1 by mouth once daily AMOXICILLIN 500 MG  CAPS (AMOXICILLIN) take #4     1 hour pre procedure. COUMADIN 10 MG TABS (WARFARIN SODIUM) as directed   Anticoagulation Visit Questionnaire      Coumadin dose missed/changed:  No      Abnormal Bleeding Symptoms:  No   Any diet changes including alcohol intake, vegetables or greens since the last visit:  No Any illnesses or hospitalizations since the last visit:  No Any signs of clotting  since the last visit (including chest discomfort, dizziness, shortness of breath, arm tingling, slurred speech, swelling or redness in leg):  No

## 2010-05-14 ENCOUNTER — Other Ambulatory Visit: Payer: Self-pay

## 2010-05-14 ENCOUNTER — Ambulatory Visit: Admit: 2010-05-14 | Payer: Self-pay | Admitting: Internal Medicine

## 2010-05-14 NOTE — Assessment & Plan Note (Signed)
Summary: pt/njr   Nurse Visit   Allergies: 1)  Sulfamethoxazole (Sulfamethoxazole) Laboratory Results   Blood Tests      INR: 3.4   (Normal Range: 0.88-1.12   Therap INR: 2.0-3.5) Comments: Rita Ohara  March 31, 2010 4:52 PM     Orders Added: 1)  Est. 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 for 3 days only then resume on  03/13/2010 Previous Coagulation Comments:  Take 10mg  only for 2 days, then 7.5mg  once daily. on  05/09/2008  NEW REGIMEN & LAB RESULTS Current INR: 3.4 Regimen: same  Repeat testing in: 2 weeks  Anticoagulation Visit Questionnaire Coumadin dose missed/changed:  No Abnormal Bleeding Symptoms:  No  Any diet changes including alcohol intake, vegetables or greens since the last visit:  No Any illnesses or hospitalizations since the last visit:  No Any signs of clotting since the last visit (including chest discomfort, dizziness, shortness of breath, arm tingling, slurred speech, swelling or redness in leg):  No  MEDICATIONS COUMADIN 5 MG TABS (WARFARIN SODIUM) Take 1.5 tabs once daily or as directed COUMADIN 7.5 MG TABS (WARFARIN SODIUM) Take as directed DIOVAN HCT 80-12.5 MG TABS (VALSARTAN-HYDROCHLOROTHIAZIDE) 1 by mouth once daily COUMADIN 10 MG TABS (WARFARIN SODIUM) as directed

## 2010-05-14 NOTE — Assessment & Plan Note (Signed)
Summary: protime/njr   Nurse Visit   Allergies: 1)  Sulfamethoxazole (Sulfamethoxazole) Laboratory Results   Blood Tests      INR: 2.7   (Normal Range: 0.88-1.12   Therap INR: 2.0-3.5) Comments: Rita Ohara  April 14, 2010 2:48 PM     Orders Added: 1)  Est. 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 on  05/09/2008  NEW REGIMEN & LAB RESULTS Current INR: 2.7 Regimen: same  Repeat testing in: 4 weeks  Anticoagulation Visit Questionnaire Coumadin dose missed/changed:  No Abnormal Bleeding Symptoms:  No  Any diet changes including alcohol intake, vegetables or greens since the last visit:  No Any illnesses or hospitalizations since the last visit:  No Any signs of clotting since the last visit (including chest discomfort, dizziness, shortness of breath, arm tingling, slurred speech, swelling or redness in leg):  No  MEDICATIONS COUMADIN 5 MG TABS (WARFARIN SODIUM) Take 1.5 tabs once daily or as directed COUMADIN 7.5 MG TABS (WARFARIN SODIUM) Take as directed DIOVAN HCT 80-12.5 MG TABS (VALSARTAN-HYDROCHLOROTHIAZIDE) 1 by mouth once daily COUMADIN 10 MG TABS (WARFARIN SODIUM) as directed

## 2010-05-15 ENCOUNTER — Other Ambulatory Visit (INDEPENDENT_AMBULATORY_CARE_PROVIDER_SITE_OTHER): Payer: Managed Care, Other (non HMO) | Admitting: Internal Medicine

## 2010-05-15 DIAGNOSIS — Z952 Presence of prosthetic heart valve: Secondary | ICD-10-CM

## 2010-05-15 DIAGNOSIS — Z7901 Long term (current) use of anticoagulants: Secondary | ICD-10-CM

## 2010-05-15 LAB — POCT INR: INR: 1.6

## 2010-05-24 ENCOUNTER — Other Ambulatory Visit: Payer: Self-pay | Admitting: Internal Medicine

## 2010-05-29 ENCOUNTER — Ambulatory Visit (INDEPENDENT_AMBULATORY_CARE_PROVIDER_SITE_OTHER): Payer: Managed Care, Other (non HMO) | Admitting: Internal Medicine

## 2010-05-29 DIAGNOSIS — Z952 Presence of prosthetic heart valve: Secondary | ICD-10-CM

## 2010-05-29 DIAGNOSIS — Z954 Presence of other heart-valve replacement: Secondary | ICD-10-CM

## 2010-05-29 LAB — POCT INR: INR: 2.3

## 2010-05-29 NOTE — Patient Instructions (Signed)
  Latest dosing instructions   Total Sun Mon Tue Wed Thu Fri Sat   57.5 10 mg 7.5 mg 7.5 mg 10 mg 7.5 mg 7.5 mg 7.5 mg    (5 mg2) (5 mg1.5) (5 mg1.5) (5 mg2) (5 mg1.5) (5 mg1.5) (5 mg1.5)        

## 2010-06-26 ENCOUNTER — Ambulatory Visit: Payer: Managed Care, Other (non HMO)

## 2010-07-01 ENCOUNTER — Other Ambulatory Visit: Payer: Managed Care, Other (non HMO)

## 2010-07-01 DIAGNOSIS — Z952 Presence of prosthetic heart valve: Secondary | ICD-10-CM

## 2010-07-01 NOTE — Patient Instructions (Signed)
Same dose 

## 2010-07-05 ENCOUNTER — Other Ambulatory Visit: Payer: Self-pay | Admitting: Internal Medicine

## 2010-07-31 ENCOUNTER — Ambulatory Visit: Payer: Managed Care, Other (non HMO)

## 2010-08-04 ENCOUNTER — Ambulatory Visit (INDEPENDENT_AMBULATORY_CARE_PROVIDER_SITE_OTHER): Payer: Managed Care, Other (non HMO) | Admitting: Internal Medicine

## 2010-08-04 DIAGNOSIS — Z952 Presence of prosthetic heart valve: Secondary | ICD-10-CM

## 2010-08-04 DIAGNOSIS — Z954 Presence of other heart-valve replacement: Secondary | ICD-10-CM

## 2010-08-04 LAB — POCT INR: INR: 1.6

## 2010-08-04 NOTE — Patient Instructions (Signed)
10mg . For 3 days only then resume normal dose.

## 2010-08-18 ENCOUNTER — Ambulatory Visit (INDEPENDENT_AMBULATORY_CARE_PROVIDER_SITE_OTHER): Payer: Managed Care, Other (non HMO) | Admitting: Internal Medicine

## 2010-08-18 DIAGNOSIS — Z954 Presence of other heart-valve replacement: Secondary | ICD-10-CM

## 2010-08-18 DIAGNOSIS — Z952 Presence of prosthetic heart valve: Secondary | ICD-10-CM

## 2010-08-18 LAB — POCT INR: INR: 3.2

## 2010-08-18 NOTE — Patient Instructions (Signed)
Same dose 10 mg on mondays and Wednesday then 7.5 mg on other days. Check in 4 weeks

## 2010-08-25 NOTE — Assessment & Plan Note (Signed)
[facility]                            CARDIOLOGY OFFICE NOTE   NOVELLE, ADDAIR                      MRN:          [id number]  DATE:05/11/2007                            DOB:          [birth year]    IDENTIFICATION:  [name] is a 60 year old woman who is referred for  Dr. Berniece Andreas for continued cardiac care.   The patient has a history of mitral valve disease and status post  replacement with a St. Jude prosthesis back in November 1994 ([address] and  [place], [place], [place]).  She has not seen a cardiologist since that  time.   The patient moved to [facility] several years ago and has been followed  [facility] by Berniece Andreas where her INRs have been followed.   On talking to the patient she denies palpitations.  No chest pain.  Her  breathing has been good.   ALLERGIES:  SULFA and LEMONS.   CURRENT MEDICATIONS:  Coumadin as directed and Diovan  hydrochlorothiazide 80/12.5 one daily.   PAST MEDICAL HISTORY:  1. Rheumatic fever.  2. Mitral valve disease status post replacement November 1994.  3. Hypertension.  4. Chickenpox as child.   FAMILY HISTORY:  Father died suddenly at age 77 felt to have an MI.   SOCIAL HISTORY:  She is married.  Is a Runner, broadcasting/film/video with a master's in  science.  Does not smoke, does not drink.   REVIEW OF SYSTEMS:  All systems reviewed negative to the above problem  except as noted above.   PHYSICAL EXAM:  On exam the patient is in no acute distress.  Blood pressure today is 153/91.  Pulses 83 and regular weight is 179.  HEENT:  Normocephalic, atraumatic, EOMI, PERL.  Mucous membranes are  moist.  NECK:  JVP is normal.  No thyromegaly or bruits.  Lungs are clear to auscultation.  No rales or wheezes.  Cardiac exam regular rate and rhythm.  Crisp valve sounds.  PMI not  displaced.  Abdominal exam supple, nontender.  Normal bowel sounds.  No masses.  EXTREMITIES:  Good distal pulses.  No lower extremity  edema.   A 12-lead EKG shows normal sinus rhythm, first-degree AV block and PR  interval of 320 ms.  Incomplete right bundle branch block.  Left atrial  abnormality.   IMPRESSION:  1. Rheumatic valve disease by report.  The patient with St. Jude      prosthesis in the mitral position.  No significant murmurs are      audible. The valve sounds are crisp.  Would recommend an      echocardiogram to confirm anatomy and gradients across the valve.      It sounds like it has been a long time since she has had one.  This      should be her baseline.  2. Health care maintenance, need to check what she has had done      recently.  There is a note of question BNP.  Her volume status does      live good.  When she comes in for the  echo I would may check      things, including a fasting lipid panel.  Again, I will need to      review in E-chart.   She will need antibiotics of course before dental work/surgery.  Follow-  up in 1 year.  Counseled her on continuing to stay active.  Given her  educational materials from the American Heart Association and also BMI  chart.     Pricilla Riffle, MD, [facility]  Electronically Signed    PVR/MedQ  DD: 05/11/2007  DT: 05/12/2007  Job #: [id number]

## 2010-08-28 NOTE — H&P (Signed)
[facility]  Patient:    Rachel Cantu, Rachel Cantu. Visit Number: [id number] MRN: [id number]          Service Type: Dictated by:   Genia Del. Riley Nearing, M.D. Adm. Date:  01/02/01                           History and Physical  DATE OF BIRTH:  13-Jul-1950.  CHIEF COMPLAINT:  None.  HISTORY OF PRESENT ILLNESS:  [name] is a 60 year old African-American female who has had previous replacement of her mitral valve.  She has a 31 mm Medtronic-Hall which is a pivoting disk-type valve. She will be undergoing dental extraction and in light of her chronic Coumadin anticoagulation, she will need to be placed on IV heparin in the preoperative period.  From a cardiac standpoint, she has done well without complaints.  PAST MEDICAL HISTORY: 1. 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 2. Chronic Coumadin therapy. 3. History of child birth. 4. Hypercholesterolemia.  ALLERGIES:  SULFA.  CURRENT MEDICATION:  Coumadin 7.5 mg x 6, 5 mg x 1.  FAMILY HISTORY:  Father died of a heart attack at a questionable age.  Mother is still alive in her mid 59s. She has three siblings who are alive and well.  SOCIAL HISTORY:  She is employed at [employer] She is divorced. She has one daughter. There is no alcohol, tobacco, or excessive caffeine use.  REVIEW OF SYSTEMS:  This is basically as stated above.  She has had no syncope, no lightheadedness, no dizziness. There has been no other neurologic complaints.  No cardiac complaints such as chest pain, shortness of breath. She tries to remain active, but is really not engaged in any regular exercise program.  She has had no abdominal pain, no constipation, no diarrhea.  No complaints of peripheral edema and otherwise review of systems is unremarkable.  PHYSICAL EXAMINATION:  GENERAL APPEARANCE:  She is a  pleasant African-American female in no acute distress.  VITAL SIGNS:  Blood pressure has been 120 to 140 systolic/90 diastolic.  Heart rate is 80, respiratory rate 18, she is afebrile.  HEENT:  Unremarkable.  NECK:  Supple, no JVD, no thyromegaly, no carotid bruits.  LUNGS:  Clear to auscultation anteriorly as well as posteriorly.  CARDIOVASCULAR:  There is a well-healed sternotomy scar. Valve sound is quite crisp. She is a regular rhythm.  ABDOMEN:  Soft, positive bowel sounds, no masses, no organomegaly.  EXTREMITIES:  No edema.  Distal pulses are intact.  NEUROLOGICAL:  Intact.  LABORATORY DATA:  Currently pending.  OVERALL IMPRESSION: 1. History of mitral valve replacement. 2. Need for anticoagulation. 3. History of rheumatic fever.  PLAN:  We will be checking a follow-up protime in the office on December 28, 2000. Will tentatively arrange for her admission to the hospital on Monday, January 02, 2001, to be placed on IV heparin and will proceed on with her dental extraction per Lyndal Pulley. Chales Salmon D.D.S., M.D., at 7:30 a.m. on Tuesday, January 03, 2001. Dictated by:   Genia Del. Riley Nearing, M.D. DD:  12/26/00 TD:  12/26/00 Job: [id number]

## 2010-08-28 NOTE — Discharge Summary (Signed)
[facility]  Patient:    Rachel Cantu, Rachel Cantu Visit Number: [id number] MRN: [id number]          Service Type: SUR Location: [id number] Attending Physician:  Beatriz Chancellor Dictated by:   Jennet Maduro Earl Gala, R.N., A.N.P. Admit Date:  01/02/2001 Discharge Date: 01/06/2001   CC:         Dr. Maryan Puls   Discharge Summary  DISCHARGE DIAGNOSES: 1. Satisfactory bridge therapy while off Coumadin for extraction of teeth #7    and #8. 2. History of rheumatic heart disease with previous mitral valve replacement. 3. History of chronic Coumadin therapy. 4. Hypertension, currently initiated on antihypertensive regimen.  HISTORY OF PRESENT ILLNESS:  [name] is a 60 year old, African-American female who has had mitral valve replacement dating back to 5.  She presents for need for dental extraction.  In light of her chronic Coumadin anticoagulation, she was admitted for heparin bridging therapy.  Please see the dictated History and Physical for further patient presentation and profile.  LABORATORY DATA AND X-RAY FINDINGS:  CBC was normal.  INR on admission was 1.6.  This was being off of Coumadin two days prior.  Chemistries were normal as well.  A 12-lead electrocardiogram showed sinus rhythm with first-degree AV block.  Chest x-ray showed postoperative changes with no acute abnormality.  HOSPITAL COURSE:  The patient was admitted.  She was started on heparin per pharmacy therapy.  Dr. Maryan Puls proceeded on with extraction of teeth #7 and #8 on January 03, 2001.  Following that procedure, she had her heparin and Coumadin restarted.  INR was slow to respond.  However, on January 06, 2001, INR is currently up to 2.1 and she is a stable candidate for discharge. Her overall physical exam and hospitalization has otherwise been uneventful and unremarkable.  CONDITION ON DISCHARGE:  Stable.  DISCHARGE MEDICATIONS: 1. Resume her Coumadin dosing as  before. 2. Add Diovan HCT 80/12.5 daily due to elevated blood pressure readings.  ACTIVITY:  As tolerated.  FOLLOWUP:  She is to see Dr. Nena Jordan as previously arranged.  Will have her followup with the nurse practitioner on October 9, for followup protime and BMP as well. Dictated by:   Jennet Maduro Earl Gala, R.N., A.N.P. Attending Physician:  Beatriz Chancellor DD:  01/06/01 TD:  01/06/01 Job: [id number]

## 2010-08-28 NOTE — Assessment & Plan Note (Signed)
[facility] OFFICE NOTE   Rachel Cantu, Rachel Cantu                      MRN:          [id number]  DATE:11/15/2005                            DOB:          October 27, 1950    NEW PATIENT VISIT:   CHIEF COMPLAINT:  New patient to establish, needs pro time.   HISTORY OF PRESENT ILLNESS:  Rachel Cantu is a 60 year old nonsmoking, married  African-American female who comes in today for a first time visit.  She is  transferring care from [facility], I believe.  I do not have her  records today, but she is planning to get them to [place].  She is generally well  but has a history of rheumatic fever as a child and developed mitral  regurgitation and had mitral valve replacement with artificial valve in  November 1994 and since that time has been on anticoagulation.  She has been  usually on 7.5 mg, occasionally has to take 10 mg, and is due for a pro  time.  She apparently has had no history of bleeding or transfusion with  this.  She also has a history of hypertension and has been on medication  since, I believe, 2002.  It appears to be well-controlled.  Currently she is  not symptomatic with cardiovascular or pulmonary symptoms and she would like  to establish with Rachel Cantu, cardiologist, but was told she needed her  primary care physician done first.   PAST MEDICAL HISTORY:  See data base.   1.  Chicken pox as a child.  2.  Rheumatic fever.  3.  AV replacement in November 1994.  4.  Hypertension.  5.  Childbirth in 1975, gravida 1, para 1, LMP June 2007.  6.  Has a history of an abnormal Pap but it was normal after that.  7.  She believes her tetanus shot was in the last 8 years.  8.  Her last mammogram was over 2 years ago.  9.  Pneumovax October 2006.  10. Had no colonoscopy.  Was scheduled for one but there were some      difficulties in pre-colonoscopy consultation and it never occurred.  The      patient did  not feel comfortable proceeding at that time.  11. She does not appear to have had bone density.   FAMILY HISTORY:  Generally well but father died suddenly at age of 36 of  what was felt to be an MI.  He did not smoke.  Grandparents are no longer  alive.  No strong family history of type 2 diabetes.  See data base.   SOCIAL HISTORY:  Household of 2, married.  Works as a Education officer, environmental.  Teaches school in the schools.  Negative TAD, see data base.   REVIEW OF SYSTEMS:  Negative for chest pain, shortness of breath, neurologic  symptoms, and bruising, bleeding, GI symptoms, and is well today.   MEDICATIONS:  1.  Coumadin 7.5 mg a day.  2.  Diovan/hydrochlorothiazide, we believe it is 80/12.5 mg, she does not  have her bottle with her, one p.o. daily.   DRUG ALLERGIES:  SULFA and __________.   OBJECTIVE:  VITAL SIGNS:  Height 5 feet 4-1/2 inches, weight 182 pounds,  pulse 60 and regular, blood pressure of 120/80.  GENERAL:  A WD/WN, healthy-appearing middle-aged lady in no acute distress.  HEENT:  Grossly unremarkable.  NECK:  Palpable thyroid, not enlarged particularly and no nodules, but it is  felt.  CHEST:  CTAB, is equal.  Well-healed scars in her chest noted.  CARDIAC:  S1, S2, no gallops or murmurs.  There is a systolic crisp click  heard.  PMI is localized and normal.  ABDOMEN:  Soft without megaly, guarding or rebound.  EXTREMITIES:  No significant edema, and pedal pulses are present.  NEUROLOGIC:  Grossly intact.  Lymph nodes negative.   IMPRESSION:  1.  Mitral valve disease with artificial valve replacement on      anticoagulation.  Pro time done today was 3.3.  will remain on her      current dosage and recheck a pro time in a month when we do fasting lab      work, which will include lipids, BMP, TSH, and basically a general      health panel.  2.  Hypertension, appears to be controlled.  Will continue on medication at      this point in time.   We  can go ahead and do a referral to [facility] and we did discuss colonoscopy,  and she had many questions about what happened in the past and she would  need a preprocedure consultation because of her anticoagulation and  artificial valve.  She will get [place] those records so that we can review.  Spent at least 45 minutes of time with patient today.  Will follow up after  her set of labs and make appropriate plans at that point in time.                                   Rachel Mends. Fabian Sharp, MD   WKP/MedQ  DD:  11/15/2005  DT:  11/16/2005  Job #:  [id number]

## 2010-08-28 NOTE — Op Note (Signed)
. [facility]  Patient:    Rachel Cantu, Rachel Cantu Visit Number: [id number] MRN: [id number]          Service Type: SUR Location: 5700 [id number] Attending Physician:  Beatriz Chancellor Proc. Date: 01/03/01 Admit Date:  01/02/2001                             Operative Report  PREOPERATIVE DIAGNOSES: 1. 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 2. Chronic anticoagulation secondary to a mitral valve replacement.  POSTOPERATIVE DIAGNOSES: 1. 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 2. Chronic anticoagulation secondary to a mitral valve replacement.  OPERATION PERFORMED:  Removal of teeth numbers 7 and 8.  SURGEON:  Lyndal Pulley. Aquilla Hacker., M.D.  ANESTHESIA:  Intravenous anesthesia and local.  INDICATIONS:  The patient is a 60 year old lady who presented with painful and infected teeth numbers 7 and 8 in need of extraction.  Dr. Deborah Chalk, her internal medicine doctor felt it necessary to admit her because of her chronic anticoagulation and intravenous SBE prophylaxis.  DESCRIPTION OF THE PROCEDURE:  The patient was identified in the holding area and brought to the operating room where she was placed supine on the operating table.  Following SBE prophylaxis per the American Heart Association the patient was draped in the usual fashion for removal of teeth.  Approximately 6 cc of 2% lidocaine with 1:100,000 epinephrine was then infiltrated along the anterior maxillary vestibule and the palatal area adjacent to numbers 7 and 8.  After waiting an adequate amount of time for anesthesia teeth numbers 7 and 8 were removed in a standard forceps delivery technique.  Both extraction sockets were packed with Gelfoam and closed using a figure-of-eight suture. The area was packed with gauze; and, the patient allowed to awaken from anesthesia in stable condition having tolerated the procedure well.  ESTIMATED BLOOD LOSS:   Insignificant. Attending Physician:  Beatriz Chancellor DD:  01/04/01 TD:  01/04/01 Job: [id number]

## 2010-09-22 ENCOUNTER — Ambulatory Visit: Payer: Managed Care, Other (non HMO)

## 2010-09-29 ENCOUNTER — Ambulatory Visit: Payer: Managed Care, Other (non HMO)

## 2010-09-29 DIAGNOSIS — Z954 Presence of other heart-valve replacement: Secondary | ICD-10-CM

## 2010-09-29 DIAGNOSIS — Z952 Presence of prosthetic heart valve: Secondary | ICD-10-CM

## 2010-09-29 DIAGNOSIS — Z7901 Long term (current) use of anticoagulants: Secondary | ICD-10-CM

## 2010-09-29 LAB — POCT INR: INR: 2.5

## 2010-09-29 NOTE — Patient Instructions (Signed)
Same dose 10 mg on sundays and Wednesday then 7.5 mg on other days. Check in 4 weeks

## 2010-10-25 ENCOUNTER — Other Ambulatory Visit: Payer: Self-pay | Admitting: Internal Medicine

## 2010-10-26 NOTE — Telephone Encounter (Signed)
LOV 11/2009 NOV NONE WITH MD

## 2010-10-29 ENCOUNTER — Ambulatory Visit (INDEPENDENT_AMBULATORY_CARE_PROVIDER_SITE_OTHER): Payer: Managed Care, Other (non HMO) | Admitting: Internal Medicine

## 2010-10-29 DIAGNOSIS — Z954 Presence of other heart-valve replacement: Secondary | ICD-10-CM

## 2010-10-29 DIAGNOSIS — Z952 Presence of prosthetic heart valve: Secondary | ICD-10-CM

## 2010-10-29 NOTE — Patient Instructions (Signed)
Same dose 10 mg on sundays and Wednesday then 7.5 mg on other days. Check in 4 weeks

## 2010-11-26 ENCOUNTER — Ambulatory Visit: Payer: Managed Care, Other (non HMO)

## 2010-11-26 DIAGNOSIS — Z954 Presence of other heart-valve replacement: Secondary | ICD-10-CM

## 2010-11-26 DIAGNOSIS — Z7901 Long term (current) use of anticoagulants: Secondary | ICD-10-CM

## 2010-11-26 DIAGNOSIS — Z952 Presence of prosthetic heart valve: Secondary | ICD-10-CM

## 2010-11-26 NOTE — Patient Instructions (Addendum)
Same dose 10 mg on sundays and Wednesday then 7.5 mg on other days. Check in 4 weeks By Dr.Todd

## 2010-12-24 ENCOUNTER — Ambulatory Visit: Payer: Managed Care, Other (non HMO)

## 2010-12-25 ENCOUNTER — Ambulatory Visit (INDEPENDENT_AMBULATORY_CARE_PROVIDER_SITE_OTHER): Payer: Managed Care, Other (non HMO) | Admitting: Internal Medicine

## 2010-12-25 DIAGNOSIS — Z952 Presence of prosthetic heart valve: Secondary | ICD-10-CM

## 2010-12-25 DIAGNOSIS — Z954 Presence of other heart-valve replacement: Secondary | ICD-10-CM

## 2010-12-25 LAB — POCT INR: INR: 4

## 2010-12-25 NOTE — Patient Instructions (Signed)
Do not take this Saturday. take 5mg . On Sunday and 7.5mg  on Monday Check monday

## 2010-12-28 ENCOUNTER — Ambulatory Visit (INDEPENDENT_AMBULATORY_CARE_PROVIDER_SITE_OTHER): Payer: Managed Care, Other (non HMO) | Admitting: Internal Medicine

## 2010-12-28 DIAGNOSIS — Z952 Presence of prosthetic heart valve: Secondary | ICD-10-CM

## 2010-12-28 DIAGNOSIS — Z954 Presence of other heart-valve replacement: Secondary | ICD-10-CM

## 2010-12-28 LAB — POCT INR: INR: 1.8

## 2010-12-28 NOTE — Patient Instructions (Signed)
Take 10 mg today and tomorrow only then back to regular dose, 10 mg on wednesdays and sundays 7.5 mg on other days,check in 1 week

## 2011-01-04 ENCOUNTER — Ambulatory Visit (INDEPENDENT_AMBULATORY_CARE_PROVIDER_SITE_OTHER): Payer: Managed Care, Other (non HMO) | Admitting: Internal Medicine

## 2011-01-04 DIAGNOSIS — Z952 Presence of prosthetic heart valve: Secondary | ICD-10-CM

## 2011-01-04 DIAGNOSIS — Z954 Presence of other heart-valve replacement: Secondary | ICD-10-CM

## 2011-01-04 LAB — POCT INR: INR: 3.3

## 2011-01-04 NOTE — Patient Instructions (Signed)
  Latest dosing instructions   Total Sun Mon Tue Wed Thu Fri Sat   57.5 10 mg 7.5 mg 7.5 mg 10 mg 7.5 mg 7.5 mg 7.5 mg    (5 mg2) (5 mg1.5) (5 mg1.5) (5 mg2) (5 mg1.5) (5 mg1.5) (5 mg1.5)        

## 2011-01-05 ENCOUNTER — Other Ambulatory Visit: Payer: Self-pay | Admitting: Internal Medicine

## 2011-01-10 ENCOUNTER — Other Ambulatory Visit: Payer: Self-pay | Admitting: Internal Medicine

## 2011-01-20 ENCOUNTER — Ambulatory Visit (INDEPENDENT_AMBULATORY_CARE_PROVIDER_SITE_OTHER): Payer: Managed Care, Other (non HMO) | Admitting: Internal Medicine

## 2011-01-20 DIAGNOSIS — Z23 Encounter for immunization: Secondary | ICD-10-CM

## 2011-01-20 DIAGNOSIS — Z952 Presence of prosthetic heart valve: Secondary | ICD-10-CM

## 2011-01-20 DIAGNOSIS — Z954 Presence of other heart-valve replacement: Secondary | ICD-10-CM

## 2011-01-20 NOTE — Patient Instructions (Signed)
  Latest dosing instructions   Total Sun Mon Tue Wed Thu Fri Sat   57.5 10 mg 7.5 mg 7.5 mg 10 mg 7.5 mg 7.5 mg 7.5 mg    (5 mg2) (5 mg1.5) (5 mg1.5) (5 mg2) (5 mg1.5) (5 mg1.5) (5 mg1.5)        

## 2011-02-17 ENCOUNTER — Ambulatory Visit: Payer: Managed Care, Other (non HMO)

## 2011-02-26 ENCOUNTER — Ambulatory Visit (INDEPENDENT_AMBULATORY_CARE_PROVIDER_SITE_OTHER): Payer: Managed Care, Other (non HMO) | Admitting: Internal Medicine

## 2011-02-26 DIAGNOSIS — Z954 Presence of other heart-valve replacement: Secondary | ICD-10-CM

## 2011-02-26 DIAGNOSIS — Z952 Presence of prosthetic heart valve: Secondary | ICD-10-CM

## 2011-02-26 LAB — POCT INR: INR: 3.3

## 2011-02-26 NOTE — Patient Instructions (Signed)
  Latest dosing instructions   Total Sun Mon Tue Wed Thu Fri Sat   57.5 10 mg 7.5 mg 7.5 mg 10 mg 7.5 mg 7.5 mg 7.5 mg    (5 mg2) (5 mg1.5) (5 mg1.5) (5 mg2) (5 mg1.5) (5 mg1.5) (5 mg1.5)        

## 2011-03-22 ENCOUNTER — Telehealth: Payer: Self-pay | Admitting: Internal Medicine

## 2011-03-22 NOTE — Telephone Encounter (Signed)
Pt has questions regarding her warfarin (COUMADIN) 5 MG tablet  Pt is saying does is incorrect. Please contact. Pt is completely out.

## 2011-03-23 ENCOUNTER — Telehealth: Payer: Self-pay | Admitting: Internal Medicine

## 2011-03-23 MED ORDER — WARFARIN SODIUM 5 MG PO TABS: ORAL_TABLET | ORAL | Status: DC

## 2011-03-23 NOTE — Telephone Encounter (Signed)
Left message on pt's machine to call back.

## 2011-03-23 NOTE — Telephone Encounter (Signed)
Disc with pharmacy Gwen?  Pharmacist.   To be  able to get a month at a time  rx 5 mg coumadin Disp 90# take 2-3 per day as directed with 11 refills .  Pt should contact pharmacy or [place] if not enough for a months worth.  In past disp 100 they were giving her 45 45 and 10 per month,   Tell PT plan

## 2011-03-23 NOTE — Telephone Encounter (Signed)
Spoke with pt about this issue that she is having with the pharmacist and she agreed that if we wrote take 1-10 tabs a day that maybe the pharmacist will cooperate with this. She has had a problem all year trying to get her refills with this type of medication.

## 2011-03-26 ENCOUNTER — Ambulatory Visit: Payer: Managed Care, Other (non HMO)

## 2011-03-26 DIAGNOSIS — Z954 Presence of other heart-valve replacement: Secondary | ICD-10-CM

## 2011-03-26 DIAGNOSIS — Z7901 Long term (current) use of anticoagulants: Secondary | ICD-10-CM

## 2011-03-26 DIAGNOSIS — Z952 Presence of prosthetic heart valve: Secondary | ICD-10-CM

## 2011-03-26 NOTE — Telephone Encounter (Signed)
Left message to call back  

## 2011-03-26 NOTE — Patient Instructions (Signed)
  Latest dosing instructions   Total Sun Mon Tue Wed Thu Fri Sat   57.5 10 mg 7.5 mg 7.5 mg 10 mg 7.5 mg 7.5 mg 7.5 mg    (5 mg2) (5 mg1.5) (5 mg1.5) (5 mg2) (5 mg1.5) (5 mg1.5) (5 mg1.5)        

## 2011-03-30 NOTE — Telephone Encounter (Signed)
Left message to call back  

## 2011-04-09 ENCOUNTER — Ambulatory Visit: Payer: Managed Care, Other (non HMO)

## 2011-04-09 DIAGNOSIS — Z954 Presence of other heart-valve replacement: Secondary | ICD-10-CM

## 2011-04-09 DIAGNOSIS — Z952 Presence of prosthetic heart valve: Secondary | ICD-10-CM

## 2011-04-09 DIAGNOSIS — Z7901 Long term (current) use of anticoagulants: Secondary | ICD-10-CM

## 2011-04-09 NOTE — Telephone Encounter (Signed)
Pt never called back.

## 2011-04-09 NOTE — Patient Instructions (Signed)
  Latest dosing instructions   Total Sun Mon Tue Wed Thu Fri Sat   57.5 10 mg 7.5 mg 7.5 mg 10 mg 7.5 mg 7.5 mg 7.5 mg    (5 mg2) (5 mg1.5) (5 mg1.5) (5 mg2) (5 mg1.5) (5 mg1.5) (5 mg1.5)        

## 2011-04-30 ENCOUNTER — Ambulatory Visit: Payer: Managed Care, Other (non HMO)

## 2011-05-20 ENCOUNTER — Other Ambulatory Visit: Payer: Self-pay | Admitting: Internal Medicine

## 2011-05-21 ENCOUNTER — Ambulatory Visit: Payer: Managed Care, Other (non HMO)

## 2011-05-21 DIAGNOSIS — Z5181 Encounter for therapeutic drug level monitoring: Secondary | ICD-10-CM

## 2011-05-21 DIAGNOSIS — Z952 Presence of prosthetic heart valve: Secondary | ICD-10-CM

## 2011-05-21 DIAGNOSIS — Z7901 Long term (current) use of anticoagulants: Secondary | ICD-10-CM

## 2011-05-21 NOTE — Patient Instructions (Signed)
  Latest dosing instructions   Total Sun Mon Tue Wed Thu Fri Sat   57.5 10 mg 7.5 mg 7.5 mg 10 mg 7.5 mg 7.5 mg 7.5 mg    (5 mg2) (5 mg1.5) (5 mg1.5) (5 mg2) (5 mg1.5) (5 mg1.5) (5 mg1.5)        

## 2011-06-18 ENCOUNTER — Ambulatory Visit: Payer: Managed Care, Other (non HMO)

## 2011-06-18 DIAGNOSIS — Z7901 Long term (current) use of anticoagulants: Secondary | ICD-10-CM

## 2011-06-18 DIAGNOSIS — Z5181 Encounter for therapeutic drug level monitoring: Secondary | ICD-10-CM

## 2011-06-18 DIAGNOSIS — Z952 Presence of prosthetic heart valve: Secondary | ICD-10-CM

## 2011-06-18 NOTE — Patient Instructions (Signed)
  Latest dosing instructions   Total Sun Mon Tue Wed Thu Fri Sat   57.5 10 mg 7.5 mg 7.5 mg 10 mg 7.5 mg 7.5 mg 7.5 mg    (5 mg2) (5 mg1.5) (5 mg1.5) (5 mg2) (5 mg1.5) (5 mg1.5) (5 mg1.5)        

## 2011-06-27 ENCOUNTER — Other Ambulatory Visit: Payer: Self-pay | Admitting: Internal Medicine

## 2011-07-16 ENCOUNTER — Ambulatory Visit (INDEPENDENT_AMBULATORY_CARE_PROVIDER_SITE_OTHER): Payer: BC Managed Care – PPO | Admitting: Internal Medicine

## 2011-07-16 DIAGNOSIS — Z954 Presence of other heart-valve replacement: Secondary | ICD-10-CM

## 2011-07-16 DIAGNOSIS — Z952 Presence of prosthetic heart valve: Secondary | ICD-10-CM

## 2011-07-16 NOTE — Patient Instructions (Signed)
  Latest dosing instructions   Total Sun Mon Tue Wed Thu Fri Sat   57.5 10 mg 7.5 mg 7.5 mg 10 mg 7.5 mg 7.5 mg 7.5 mg    (5 mg2) (5 mg1.5) (5 mg1.5) (5 mg2) (5 mg1.5) (5 mg1.5) (5 mg1.5)        

## 2011-07-23 ENCOUNTER — Ambulatory Visit: Payer: BC Managed Care – PPO

## 2011-07-26 ENCOUNTER — Other Ambulatory Visit: Payer: Self-pay | Admitting: Internal Medicine

## 2011-07-27 ENCOUNTER — Ambulatory Visit (INDEPENDENT_AMBULATORY_CARE_PROVIDER_SITE_OTHER): Payer: BC Managed Care – PPO | Admitting: Internal Medicine

## 2011-07-27 DIAGNOSIS — Z952 Presence of prosthetic heart valve: Secondary | ICD-10-CM

## 2011-07-27 DIAGNOSIS — Z954 Presence of other heart-valve replacement: Secondary | ICD-10-CM

## 2011-07-27 LAB — POCT INR: INR: 2.5

## 2011-07-27 NOTE — Patient Instructions (Signed)
  Latest dosing instructions   Total Sun Mon Tue Wed Thu Fri Sat   57.5 10 mg 7.5 mg 7.5 mg 10 mg 7.5 mg 7.5 mg 7.5 mg    (5 mg2) (5 mg1.5) (5 mg1.5) (5 mg2) (5 mg1.5) (5 mg1.5) (5 mg1.5)        

## 2011-08-24 ENCOUNTER — Ambulatory Visit (INDEPENDENT_AMBULATORY_CARE_PROVIDER_SITE_OTHER): Payer: BC Managed Care – PPO | Admitting: Internal Medicine

## 2011-08-24 DIAGNOSIS — Z954 Presence of other heart-valve replacement: Secondary | ICD-10-CM

## 2011-08-24 DIAGNOSIS — Z0289 Encounter for other administrative examinations: Secondary | ICD-10-CM

## 2011-08-24 LAB — POCT INR: INR: 2.1

## 2011-08-24 NOTE — Patient Instructions (Addendum)
  Latest dosing instructions   Total Sun Mon Tue Wed Thu Fri Sat   60 10 mg 7.5 mg 7.5 mg 10 mg 7.5 mg 10 mg 7.5 mg    (5 mg2) (5 mg1.5) (5 mg1.5) (5 mg2) (5 mg1.5) (5 mg2) (5 mg1.5)       

## 2011-09-07 ENCOUNTER — Ambulatory Visit (INDEPENDENT_AMBULATORY_CARE_PROVIDER_SITE_OTHER): Payer: BC Managed Care – PPO | Admitting: Family

## 2011-09-07 DIAGNOSIS — Z954 Presence of other heart-valve replacement: Secondary | ICD-10-CM

## 2011-09-07 DIAGNOSIS — Z952 Presence of prosthetic heart valve: Secondary | ICD-10-CM

## 2011-09-07 NOTE — Patient Instructions (Signed)
  Latest dosing instructions   Total Glynis Smiles Tue Wed Thu Fri Sat   60 10 mg 7.5 mg 7.5 mg 10 mg 7.5 mg 10 mg 7.5 mg    (5 mg2) (5 mg1.5) (5 mg1.5) (5 mg2) (5 mg1.5) (5 mg2) (5 mg1.5)

## 2011-09-28 ENCOUNTER — Encounter: Payer: BC Managed Care – PPO | Admitting: Family

## 2011-09-28 DIAGNOSIS — Z0289 Encounter for other administrative examinations: Secondary | ICD-10-CM

## 2011-10-07 ENCOUNTER — Telehealth: Payer: Self-pay | Admitting: Family

## 2011-10-07 NOTE — Telephone Encounter (Signed)
Patient needs to schedule appointment to have Coumadin level checked.

## 2011-10-11 NOTE — Telephone Encounter (Signed)
Called and lft vm for pt to sch appt for Coumadin lvl check as noted.

## 2011-11-03 ENCOUNTER — Ambulatory Visit (INDEPENDENT_AMBULATORY_CARE_PROVIDER_SITE_OTHER): Payer: BC Managed Care – PPO | Admitting: Family

## 2011-11-03 DIAGNOSIS — Z954 Presence of other heart-valve replacement: Secondary | ICD-10-CM

## 2011-11-03 DIAGNOSIS — Z952 Presence of prosthetic heart valve: Secondary | ICD-10-CM

## 2011-11-03 NOTE — Patient Instructions (Signed)
Hold dose today only. Then continue 10mg  Sunday, Wednesday, and Friday. 7.5mg  all other days. Recheck in 2 weeks.    Latest dosing instructions   Total Rachel Cantu Tue Wed Thu Fri Sat   60 10 mg 7.5 mg 7.5 mg 10 mg 7.5 mg 10 mg 7.5 mg    (5 mg2) (5 mg1.5) (5 mg1.5) (5 mg2) (5 mg1.5) (5 mg2) (5 mg1.5)

## 2011-11-10 ENCOUNTER — Other Ambulatory Visit: Payer: Self-pay | Admitting: Internal Medicine

## 2011-11-10 MED ORDER — WARFARIN SODIUM 5 MG PO TABS: 5.0000 mg | ORAL_TABLET | Freq: Every day | ORAL | Status: DC

## 2011-11-10 NOTE — Telephone Encounter (Addendum)
Pt needs refill on coumadin 5 mg call into [facility] [phone]. Pt is out

## 2011-11-16 ENCOUNTER — Encounter: Payer: BC Managed Care – PPO | Admitting: Family

## 2011-11-17 ENCOUNTER — Encounter: Payer: Self-pay | Admitting: Internal Medicine

## 2011-11-17 ENCOUNTER — Encounter: Payer: BC Managed Care – PPO | Admitting: Family

## 2011-11-17 ENCOUNTER — Ambulatory Visit (INDEPENDENT_AMBULATORY_CARE_PROVIDER_SITE_OTHER): Payer: BC Managed Care – PPO | Admitting: Family

## 2011-11-17 ENCOUNTER — Ambulatory Visit (INDEPENDENT_AMBULATORY_CARE_PROVIDER_SITE_OTHER): Payer: BC Managed Care – PPO | Admitting: Internal Medicine

## 2011-11-17 VITALS — BP 160/90 | HR 83 | Temp 98.5°F | Wt 180.0 lb

## 2011-11-17 DIAGNOSIS — I10 Essential (primary) hypertension: Secondary | ICD-10-CM

## 2011-11-17 DIAGNOSIS — Z8679 Personal history of other diseases of the circulatory system: Secondary | ICD-10-CM

## 2011-11-17 DIAGNOSIS — Z7901 Long term (current) use of anticoagulants: Secondary | ICD-10-CM

## 2011-11-17 DIAGNOSIS — E785 Hyperlipidemia, unspecified: Secondary | ICD-10-CM

## 2011-11-17 DIAGNOSIS — Z954 Presence of other heart-valve replacement: Secondary | ICD-10-CM

## 2011-11-17 DIAGNOSIS — N926 Irregular menstruation, unspecified: Secondary | ICD-10-CM | POA: Insufficient documentation

## 2011-11-17 DIAGNOSIS — F439 Reaction to severe stress, unspecified: Secondary | ICD-10-CM

## 2011-11-17 DIAGNOSIS — Z952 Presence of prosthetic heart valve: Secondary | ICD-10-CM

## 2011-11-17 DIAGNOSIS — F430 Acute stress reaction: Secondary | ICD-10-CM

## 2011-11-17 DIAGNOSIS — Z5181 Encounter for therapeutic drug level monitoring: Secondary | ICD-10-CM

## 2011-11-17 NOTE — Patient Instructions (Addendum)
Continue 10mg  Sunday, Wednesday, and Friday. 7.5mg  all other days. Recheck in 3 weeks.    Latest dosing instructions   Total Glynis Smiles Tue Wed Thu Fri Sat   60 10 mg 7.5 mg 7.5 mg 10 mg 7.5 mg 10 mg 7.5 mg    (5 mg2) (5 mg1.5) (5 mg1.5) (5 mg2) (5 mg1.5) (5 mg2) (5 mg1.5)

## 2011-11-17 NOTE — Progress Notes (Signed)
  Subjective:    Patient ID: Rachel Cantu, female    DOB: 1951/01/09, 61 y.o.   MRN: [id number]  HPI Pt comes in medical evaluation   Last ov was over 18 months. ago.?  She is on same bp medication without se. And had been good in the 118 range but recently  Has had some stress  Related to weight gain and not as healthy life style Had some stress today.  No cp sob  cv pulm sx ir edema Anticoagulation without bleeding. Sees Dr Tenny Craw yearly and Gyne yearly . Says she is utd on HCP .  No major injuries or hospitalization  Review of Systems Outpatient Encounter Prescriptions as of 11/17/2011  Medication Sig Dispense Refill  . DIOVAN HCT 80-12.5 MG per tablet TAKE ONE TABLET BY MOUTH DAILY  30 tablet  10  . warfarin (COUMADIN) 5 MG tablet Take 1 tablet (5 mg total) by mouth daily.  90 tablet  0  neg tobacco  ROS:  GEN/ HEENT: No fever, headaches vision  Changes hearing changes, CV/ PULM; No chest pain shortness of breath cough, syncope,edema  change in exercise tolerance. GI /GU: No adominal pain, vomiting, change in bowel habits. No blood in the stool. No significant GU symptoms. SKIN/HEME: ,no acute skin rashes suspicious lesions or bleeding. No lymphadenopathy, nodules, masses.  NEURO/ PSYCH:  No neurologic signs such as weakness numbness. No depression . IMM/ Allergy: No unusual infections.  Allergy .   REST of 12 system review negative except as per HPI Past history family history social history reviewed in the electronic medical record.     Objective:   Physical Exam  BP 160/90  Pulse 83  Temp 98.5 F (36.9 C) (Oral)  Wt 180 lb (81.647 kg)  SpO2 97% WDWN in nad   Wt Readings from Last 3 Encounters:  11/17/11 180 lb (81.647 kg)  12/01/09 180 lb (81.647 kg)  10/11/08 174 lb (78.926 kg)   Talkative in nad     Repeat bp reading 144/80  HEENT non focal no redness op clear tongue midline tms clear Neck: Supple without adenopathy or masses or bruits Chest:  Clear to A&P without  wheezes rales or rhonchi CV:  S1-S2 no gallops  RR Crisp heart sounds  peripheral perfusion is normal  No clubbing cyanosis or edema Abdomen:  Sof,t normal bowel sounds without hepatosplenomegaly, no guarding rebound or masses no CVA tenderness Neuro grossly non focal  Oriented x 3 and no noted deficits in memory, attention, and speech. No tremor      Assessment & Plan:  HT  Pt today   Stress  Reported by patient came down in office but still not at goal needs follow up if still elevated on recheck. Counseled. Anticoagulation  Today  Check  Per protochol MVR   Followed  Per Dr Tenny Craw.  No current obvious sx of findings Stress  Counseled.  LIPIDS  Come back for fasting labs and can share with dr Tenny Craw.   Total visit > 50% spent counseling and coordinating care  ehr updated as much as possible

## 2011-11-17 NOTE — Patient Instructions (Addendum)
Your repeat BP was 144/80  Check readings  At least 3 x per week.  Goal is below 140/90  If not at goal contact us  For  Plan.   You are due for labs    Fasting blood  Work due.

## 2011-11-18 ENCOUNTER — Encounter: Payer: Self-pay | Admitting: Internal Medicine

## 2011-11-18 DIAGNOSIS — F439 Reaction to severe stress, unspecified: Secondary | ICD-10-CM | POA: Insufficient documentation

## 2011-11-23 ENCOUNTER — Other Ambulatory Visit (INDEPENDENT_AMBULATORY_CARE_PROVIDER_SITE_OTHER): Payer: BC Managed Care – PPO

## 2011-11-23 DIAGNOSIS — Z0000 Encounter for general adult medical examination without abnormal findings: Secondary | ICD-10-CM

## 2011-11-23 LAB — LDL CHOLESTEROL, DIRECT: Direct LDL: 167.7 mg/dL

## 2011-11-23 LAB — BASIC METABOLIC PANEL
CO2: 25 mEq/L (ref 19–32)
Chloride: 104 mEq/L (ref 96–112)
GFR: 107.01 mL/min (ref >60.00)
Glucose, Bld: 105 mg/dL — ABNORMAL HIGH (ref 70–99)
Potassium: 3.7 mEq/L (ref 3.5–5.1)
Sodium: 138 mEq/L (ref 135–145)

## 2011-11-23 LAB — TSH: TSH: 0.85 u[IU]/mL (ref 0.35–5.50)

## 2011-11-23 LAB — POCT URINALYSIS DIPSTICK
Glucose, UA: NEGATIVE
Ketones, UA: NEGATIVE
Spec Grav, UA: 1.025

## 2011-11-23 LAB — CBC WITH DIFFERENTIAL/PLATELET
Basophils Absolute: 0.1 10*3/uL (ref 0.0–0.1)
HCT: 41.5 % (ref 36.0–46.0)
Hemoglobin: 13.5 g/dL (ref 12.0–15.0)
Lymphs Abs: 2.4 10*3/uL (ref 0.7–4.0)
MCV: 93.7 fl (ref 78.0–100.0)
Monocytes Absolute: 0.5 10*3/uL (ref 0.1–1.0)
Monocytes Relative: 8.2 % (ref 3.0–12.0)
Neutro Abs: 3.3 10*3/uL (ref 1.4–7.7)
RDW: 12.8 % (ref 11.5–14.6)

## 2011-11-23 LAB — HEPATIC FUNCTION PANEL
ALT: 16 U/L (ref 0–35)
AST: 25 U/L (ref 0–37)
Albumin: 3.9 g/dL (ref 3.5–5.2)

## 2011-12-01 ENCOUNTER — Other Ambulatory Visit: Payer: Self-pay | Admitting: Family Medicine

## 2011-12-01 DIAGNOSIS — R319 Hematuria, unspecified: Secondary | ICD-10-CM

## 2011-12-03 ENCOUNTER — Telehealth: Payer: Self-pay | Admitting: Internal Medicine

## 2011-12-03 NOTE — Telephone Encounter (Signed)
Patient may be moved to that time slot if one is available.  Please call patient and schedule.

## 2011-12-03 NOTE — Telephone Encounter (Signed)
You may need to check with Oran Rein if this is outside of regular coumadin clinic hours.  She will be back on Monday.

## 2011-12-03 NOTE — Telephone Encounter (Signed)
Pt called and is req to get the coumadin lab she is sch for on 12/15/11, moved to 4pm that day. Pls advise.

## 2011-12-06 NOTE — Telephone Encounter (Signed)
OK to move to 4pm

## 2011-12-06 NOTE — Telephone Encounter (Signed)
Called pt and told her that it is ok for pt to come in for PT on 12/15/11 at 4pm as noted.

## 2011-12-10 ENCOUNTER — Other Ambulatory Visit: Payer: BC Managed Care – PPO

## 2011-12-15 ENCOUNTER — Encounter: Payer: BC Managed Care – PPO | Admitting: Family

## 2011-12-15 ENCOUNTER — Ambulatory Visit (INDEPENDENT_AMBULATORY_CARE_PROVIDER_SITE_OTHER): Payer: BC Managed Care – PPO | Admitting: Family

## 2011-12-15 DIAGNOSIS — R8290 Unspecified abnormal findings in urine: Secondary | ICD-10-CM

## 2011-12-15 DIAGNOSIS — R82998 Other abnormal findings in urine: Secondary | ICD-10-CM

## 2011-12-15 DIAGNOSIS — Z952 Presence of prosthetic heart valve: Secondary | ICD-10-CM

## 2011-12-15 DIAGNOSIS — Z954 Presence of other heart-valve replacement: Secondary | ICD-10-CM

## 2011-12-15 MED ORDER — CEPHALEXIN 500 MG PO TABS: 500.0000 mg | ORAL_TABLET | Freq: Three times a day (TID) | ORAL | Status: AC

## 2011-12-15 NOTE — Patient Instructions (Signed)
Take 1 tab today only. Continue to eat greens. Then Continue 10mg  Sunday, Wednesday, and Friday. 7.5mg  all other days. Recheck in 3 weeks.    Latest dosing instructions   Total Glynis Smiles Tue Wed Thu Fri Sat   60 10 mg 7.5 mg 7.5 mg 10 mg 7.5 mg 10 mg 7.5 mg    (5 mg2) (5 mg1.5) (5 mg1.5) (5 mg2) (5 mg1.5) (5 mg2) (5 mg1.5)

## 2012-01-05 ENCOUNTER — Ambulatory Visit (INDEPENDENT_AMBULATORY_CARE_PROVIDER_SITE_OTHER): Payer: BC Managed Care – PPO | Admitting: Family

## 2012-01-05 ENCOUNTER — Encounter: Payer: Self-pay | Admitting: Family

## 2012-01-05 VITALS — BP 118/80 | HR 83 | Temp 98.7°F | Wt 177.0 lb

## 2012-01-05 DIAGNOSIS — J069 Acute upper respiratory infection, unspecified: Secondary | ICD-10-CM

## 2012-01-05 DIAGNOSIS — Z954 Presence of other heart-valve replacement: Secondary | ICD-10-CM

## 2012-01-05 DIAGNOSIS — Z952 Presence of prosthetic heart valve: Secondary | ICD-10-CM

## 2012-01-05 NOTE — Patient Instructions (Addendum)
Hold Coumadin Thursday, Friday, and Saturday. Resume Sunday at 7.5mg .   Take 7.5mg  daily except 5mg  on Tuesday and Thursday only.     Latest dosing instructions   Total Sun Mon Tue Wed Thu Fri Sat   47.5 7.5 mg 7.5 mg 5 mg 7.5 mg 5 mg 7.5 mg 7.5 mg    (5 mg1.5) (5 mg1.5) (5 mg1) (5 mg1.5) (5 mg1) (5 mg1.5) (5 mg1.5)       Upper Respiratory Infection, Adult An upper respiratory infection (URI) is also sometimes known as the common cold. The upper respiratory tract includes the nose, sinuses, throat, trachea, and bronchi. Bronchi are the airways leading to the lungs. Most people improve within 1 week, but symptoms can last up to 2 weeks. A residual cough may last even longer.  CAUSES Many different viruses can infect the tissues lining the upper respiratory tract. The tissues become irritated and inflamed and often become very moist. Mucus production is also common. A cold is contagious. You can easily spread the virus to others by oral contact. This includes kissing, sharing a glass, coughing, or sneezing. Touching your mouth or nose and then touching a surface, which is then touched by another person, can also spread the virus. SYMPTOMS  Symptoms typically develop 1 to 3 days after you come in contact with a cold virus. Symptoms vary from person to person. They may include:  Runny nose.   Sneezing.   Nasal congestion.   Sinus irritation.   Sore throat.   Loss of voice (laryngitis).   Cough.   Fatigue.   Muscle aches.   Loss of appetite.   Headache.   Low-grade fever.  DIAGNOSIS  You might diagnose your own cold based on familiar symptoms, since most people get a cold 2 to 3 times a year. Your caregiver can confirm this based on your exam. Most importantly, your caregiver can check that your symptoms are not due to another disease such as strep throat, sinusitis, pneumonia, asthma, or epiglottitis. Blood tests, throat tests, and X-rays are not necessary to diagnose a  common cold, but they may sometimes be helpful in excluding other more serious diseases. Your caregiver will decide if any further tests are required. RISKS AND COMPLICATIONS  You may be at risk for a more severe case of the common cold if you smoke cigarettes, have chronic heart disease (such as heart failure) or lung disease (such as asthma), or if you have a weakened immune system. The very young and very old are also at risk for more serious infections. Bacterial sinusitis, middle ear infections, and bacterial pneumonia can complicate the common cold. The common cold can worsen asthma and chronic obstructive pulmonary disease (COPD). Sometimes, these complications can require emergency medical care and may be life-threatening. PREVENTION  The best way to protect against getting a cold is to practice good hygiene. Avoid oral or hand contact with people with cold symptoms. Wash your hands often if contact occurs. There is no clear evidence that vitamin C, vitamin E, echinacea, or exercise reduces the chance of developing a cold. However, it is always recommended to get plenty of rest and practice good nutrition. TREATMENT  Treatment is directed at relieving symptoms. There is no cure. Antibiotics are not effective, because the infection is caused by a virus, not by bacteria. Treatment may include:  Increased fluid intake. Sports drinks offer valuable electrolytes, sugars, and fluids.   Breathing heated mist or steam (vaporizer or shower).   Eating chicken soup  or other clear broths, and maintaining good nutrition.   Getting plenty of rest.   Using gargles or lozenges for comfort.   Controlling fevers with ibuprofen or acetaminophen as directed by your caregiver.   Increasing usage of your inhaler if you have asthma.  Zinc gel and zinc lozenges, taken in the first 24 hours of the common cold, can shorten the duration and lessen the severity of symptoms. Pain medicines may help with fever,  muscle aches, and throat pain. A variety of non-prescription medicines are available to treat congestion and runny nose. Your caregiver can make recommendations and may suggest nasal or lung inhalers for other symptoms.  HOME CARE INSTRUCTIONS   Only take over-the-counter or prescription medicines for pain, discomfort, or fever as directed by your caregiver.   Use a warm mist humidifier or inhale steam from a shower to increase air moisture. This may keep secretions moist and make it easier to breathe.   Drink enough water and fluids to keep your urine clear or pale yellow.   Rest as needed.   Return to work when your temperature has returned to normal or as your caregiver advises. You may need to stay home longer to avoid infecting others. You can also use a face mask and careful hand washing to prevent spread of the virus.  SEEK MEDICAL CARE IF:   After the first few days, you feel you are getting worse rather than better.   You need your caregiver's advice about medicines to control symptoms.   You develop chills, worsening shortness of breath, or brown or red sputum. These may be signs of pneumonia.   You develop yellow or brown nasal discharge or pain in the face, especially when you bend forward. These may be signs of sinusitis.   You develop a fever, swollen neck glands, pain with swallowing, or white areas in the back of your throat. These may be signs of strep throat.  SEEK IMMEDIATE MEDICAL CARE IF:   You have a fever.   You develop severe or persistent headache, ear pain, sinus pain, or chest pain.   You develop wheezing, a prolonged cough, cough up blood, or have a change in your usual mucus (if you have chronic lung disease).   You develop sore muscles or a stiff neck.  Document Released: 09/22/2000 Document Revised: 03/18/2011 Document Reviewed: 07/31/2010 [facility] Patient Information 2012 [place], [place].

## 2012-01-05 NOTE — Progress Notes (Signed)
  Subjective:    Patient ID: Rachel Cantu, female    DOB: Jan 01, 1951, 61 y.o.   MRN: [id number]  HPI 61 year old AAF is in today with c/o cough, congestion, runny nose, and sneezing x 1 week. She is doing much better today. She has been taking OTC vitamin C, vitamins, and minerals. Denies and sick contacts. She is finishing cephalexin for a UTI.    Review of Systems  Constitutional: Negative.   HENT: Positive for nosebleeds, congestion, rhinorrhea, sneezing and postnasal drip.   Respiratory: Positive for cough.   Skin: Negative.   Neurological: Negative.   Hematological: Negative.   Psychiatric/Behavioral: Negative.    Past Medical History  Diagnosis Date  . Hypertension   . History of chickenpox   . S/P MVR (mitral valve replacement) 1994  . Rheumatic fever     as a child    History   Social History  . Marital Status: Divorced    Spouse Name: N/A    Number of Children: N/A  . Years of Education: N/A   Occupational History  . Not on file.   Social History Main Topics  . Smoking status: Never Smoker   . Smokeless tobacco: Not on file  . Alcohol Use: No  . Drug Use: Not on file  . Sexually Active: Not on file   Other Topics Concern  . Not on file   Social History Narrative   hh of 2 G1P1Divorced    Past Surgical History  Procedure Date  . Mitral valve replacement 1994    Family History  Problem Relation Age of Onset  . Heart disease Father     premature age 76    Allergies  Allergen Reactions  . Sulfamethoxazole     REACTION: unspecified    Current Outpatient Prescriptions on File Prior to Visit  Medication Sig Dispense Refill  . DIOVAN HCT 80-12.5 MG per tablet TAKE ONE TABLET BY MOUTH DAILY  30 tablet  10  . warfarin (COUMADIN) 5 MG tablet Take 1 tablet (5 mg total) by mouth daily.  90 tablet  0    BP 118/80  Pulse 83  Temp 98.7 F (37.1 C) (Oral)  Wt 177 lb (80.287 kg)  SpO2 97%chart    Objective:   Physical Exam  Constitutional:  She is oriented to person, place, and time. She appears well-developed and well-nourished.  HENT:  Right Ear: External ear normal.  Left Ear: External ear normal.  Nose: Nose normal.  Mouth/Throat: Oropharynx is clear and moist.  Neck: Normal range of motion. Neck supple.  Cardiovascular: Normal rate and regular rhythm.   Pulmonary/Chest: Effort normal and breath sounds normal.  Neurological: She is alert and oriented to person, place, and time.  Skin: Skin is warm and dry.  Psychiatric: She has a normal mood and affect.          Assessment & Plan:  Assessment: Upper Resp Infection  Plan: OTC symptomatic treatment for relief. Call the office if symptoms worsen or persist. Recheck as scheduled and as needed.

## 2012-01-12 ENCOUNTER — Encounter: Payer: BC Managed Care – PPO | Admitting: Family

## 2012-01-15 ENCOUNTER — Other Ambulatory Visit: Payer: Self-pay | Admitting: Internal Medicine

## 2012-01-18 ENCOUNTER — Ambulatory Visit (INDEPENDENT_AMBULATORY_CARE_PROVIDER_SITE_OTHER): Payer: BC Managed Care – PPO | Admitting: Family

## 2012-01-18 DIAGNOSIS — Z954 Presence of other heart-valve replacement: Secondary | ICD-10-CM

## 2012-01-18 DIAGNOSIS — Z952 Presence of prosthetic heart valve: Secondary | ICD-10-CM

## 2012-01-18 NOTE — Patient Instructions (Signed)
Take 7.5mg  daily except 5mg  on Tuesday and Thursday only.     Latest dosing instructions   Total Sun Mon Tue Wed Thu Fri Sat   47.5 7.5 mg 7.5 mg 5 mg 7.5 mg 5 mg 7.5 mg 7.5 mg    (5 mg1.5) (5 mg1.5) (5 mg1) (5 mg1.5) (5 mg1) (5 mg1.5) (5 mg1.5)

## 2012-02-07 ENCOUNTER — Ambulatory Visit (INDEPENDENT_AMBULATORY_CARE_PROVIDER_SITE_OTHER): Payer: BC Managed Care – PPO | Admitting: Family

## 2012-02-07 DIAGNOSIS — Z952 Presence of prosthetic heart valve: Secondary | ICD-10-CM

## 2012-02-07 DIAGNOSIS — Z954 Presence of other heart-valve replacement: Secondary | ICD-10-CM

## 2012-02-07 LAB — POCT INR: INR: 2.6

## 2012-02-07 NOTE — Patient Instructions (Addendum)
Take 7.5mg  daily except 5mg  on Tuesday and Thursday only. Recheck in 4 weeks.      Latest dosing instructions   Total Sun Mon Tue Wed Thu Fri Sat   47.5 7.5 mg 7.5 mg 5 mg 7.5 mg 5 mg 7.5 mg 7.5 mg    (5 mg1.5) (5 mg1.5) (5 mg1) (5 mg1.5) (5 mg1) (5 mg1.5) (5 mg1.5)

## 2012-03-08 ENCOUNTER — Ambulatory Visit (INDEPENDENT_AMBULATORY_CARE_PROVIDER_SITE_OTHER): Payer: BC Managed Care – PPO | Admitting: Family

## 2012-03-08 DIAGNOSIS — Z954 Presence of other heart-valve replacement: Secondary | ICD-10-CM

## 2012-03-08 DIAGNOSIS — Z952 Presence of prosthetic heart valve: Secondary | ICD-10-CM

## 2012-03-08 LAB — POCT INR: INR: 2.7

## 2012-03-08 NOTE — Patient Instructions (Signed)
Take 7.5mg daily except 5mg on Tuesday and Thursday only. Recheck in 6 weeks.     Latest dosing instructions   Total Sun Mon Tue Wed Thu Fri Sat   47.5 7.5 mg 7.5 mg 5 mg 7.5 mg 5 mg 7.5 mg 7.5 mg    (5 mg1.5) (5 mg1.5) (5 mg1) (5 mg1.5) (5 mg1) (5 mg1.5) (5 mg1.5)        

## 2012-03-22 ENCOUNTER — Other Ambulatory Visit: Payer: Self-pay

## 2012-03-22 MED ORDER — WARFARIN SODIUM 5 MG PO TABS: 5.0000 mg | ORAL_TABLET | Freq: Every day | ORAL | Status: DC

## 2012-03-27 ENCOUNTER — Other Ambulatory Visit: Payer: Self-pay

## 2012-03-27 MED ORDER — WARFARIN SODIUM 5 MG PO TABS: 5.0000 mg | ORAL_TABLET | Freq: Every day | ORAL | Status: DC

## 2012-04-19 ENCOUNTER — Ambulatory Visit (INDEPENDENT_AMBULATORY_CARE_PROVIDER_SITE_OTHER): Payer: BC Managed Care – PPO | Admitting: Family

## 2012-04-19 DIAGNOSIS — Z952 Presence of prosthetic heart valve: Secondary | ICD-10-CM

## 2012-04-19 DIAGNOSIS — Z954 Presence of other heart-valve replacement: Secondary | ICD-10-CM

## 2012-04-19 NOTE — Patient Instructions (Signed)
Take 7.5mg  daily except 5mg  on Tuesday and Thursday only. Recheck in 6 weeks.     Latest dosing instructions   Total Sun Mon Tue Wed Thu Fri Sat   47.5 7.5 mg 7.5 mg 5 mg 7.5 mg 5 mg 7.5 mg 7.5 mg    (5 mg1.5) (5 mg1.5) (5 mg1) (5 mg1.5) (5 mg1) (5 mg1.5) (5 mg1.5)

## 2012-05-17 ENCOUNTER — Ambulatory Visit (INDEPENDENT_AMBULATORY_CARE_PROVIDER_SITE_OTHER): Payer: BC Managed Care – PPO | Admitting: Family

## 2012-05-17 DIAGNOSIS — Z952 Presence of prosthetic heart valve: Secondary | ICD-10-CM

## 2012-05-17 DIAGNOSIS — Z954 Presence of other heart-valve replacement: Secondary | ICD-10-CM

## 2012-05-17 NOTE — Patient Instructions (Signed)
Take an extra 1/2 tablet today only. Take 7.5mg  daily except 5mg  on Tuesday and Thursday only. Recheck in 4 weeks.     Latest dosing instructions   Total Sun Mon Tue Wed Thu Fri Sat   47.5 7.5 mg 7.5 mg 5 mg 7.5 mg 5 mg 7.5 mg 7.5 mg    (5 mg1.5) (5 mg1.5) (5 mg1) (5 mg1.5) (5 mg1) (5 mg1.5) (5 mg1.5)

## 2012-05-29 ENCOUNTER — Ambulatory Visit: Payer: BC Managed Care – PPO | Admitting: Internal Medicine

## 2012-05-29 ENCOUNTER — Encounter: Payer: BC Managed Care – PPO | Admitting: Family

## 2012-06-14 ENCOUNTER — Encounter: Payer: BC Managed Care – PPO | Admitting: Family

## 2012-06-15 ENCOUNTER — Telehealth: Payer: Self-pay

## 2012-06-15 ENCOUNTER — Other Ambulatory Visit: Payer: Self-pay

## 2012-06-15 MED ORDER — WARFARIN SODIUM 5 MG PO TABS: 5.0000 mg | ORAL_TABLET | Freq: Every day | ORAL | Status: DC

## 2012-06-15 NOTE — Telephone Encounter (Signed)
Left message to advise of missed appt and to call office to schedule pt/inr appointment

## 2012-06-19 ENCOUNTER — Ambulatory Visit (INDEPENDENT_AMBULATORY_CARE_PROVIDER_SITE_OTHER): Payer: BC Managed Care – PPO | Admitting: Family

## 2012-06-19 DIAGNOSIS — Z952 Presence of prosthetic heart valve: Secondary | ICD-10-CM

## 2012-06-19 LAB — POCT INR: INR: 1.3

## 2012-06-19 MED ORDER — WARFARIN SODIUM 5 MG PO TABS: 5.0000 mg | ORAL_TABLET | Freq: Every day | ORAL | Status: DC

## 2012-06-19 NOTE — Patient Instructions (Addendum)
Take 2 tabs today and tomorrow. Then continue 7.5mg  daily except 5mg  on Tuesday and Thursday only. Recheck in 2 weeks.   Anticoagulation Dose Instructions as of 06/19/2012     Glynis Smiles Tue Wed Thu Fri Sat   New Dose 7.5 mg 7.5 mg 5 mg 7.5 mg 5 mg 7.5 mg 7.5 mg    Description       Take 2 tabs today and tomorrow. Then continue 7.5mg  daily except 5mg  on Tuesday and Thursday only. Recheck in 2 weeks.

## 2012-07-03 ENCOUNTER — Encounter: Payer: BC Managed Care – PPO | Admitting: Family

## 2012-07-03 DIAGNOSIS — Z0289 Encounter for other administrative examinations: Secondary | ICD-10-CM

## 2012-07-15 ENCOUNTER — Other Ambulatory Visit: Payer: Self-pay | Admitting: Internal Medicine

## 2012-07-17 ENCOUNTER — Telehealth: Payer: Self-pay | Admitting: Family Medicine

## 2012-07-17 NOTE — Telephone Encounter (Signed)
I have not spoke to the pt.  She needs to be scheduled for a CPE.  Please call the patient and set up this appt with WP.  I will refill her Diovan-HCTZ and send to the pharmacy.  Thanks!!

## 2012-07-18 ENCOUNTER — Telehealth: Payer: Self-pay | Admitting: Family

## 2012-07-18 ENCOUNTER — Telehealth: Payer: Self-pay | Admitting: Internal Medicine

## 2012-07-18 NOTE — Telephone Encounter (Signed)
Left message to advise pt to schedule pt/inr appointment

## 2012-07-18 NOTE — Telephone Encounter (Signed)
Called [facility]. They state the rx is ready for her and will be $10. Please advise pt. Encounter closed.

## 2012-07-18 NOTE — Telephone Encounter (Signed)
Pt was instructed by [facility] to call MD's office and follow up on her valsartan-hydrochlorothiazide (DIOVAN-HCT) 80-12.5 MG per tablet refill request. Pls advise

## 2012-07-18 NOTE — Telephone Encounter (Signed)
Please have INR checked asap

## 2012-07-18 NOTE — Telephone Encounter (Signed)
lmom to return my call and sch appt/kh

## 2012-07-18 NOTE — Telephone Encounter (Signed)
thank you !!

## 2012-07-19 ENCOUNTER — Ambulatory Visit (INDEPENDENT_AMBULATORY_CARE_PROVIDER_SITE_OTHER): Payer: BC Managed Care – PPO | Admitting: Family

## 2012-07-19 DIAGNOSIS — Z952 Presence of prosthetic heart valve: Secondary | ICD-10-CM

## 2012-07-19 DIAGNOSIS — Z954 Presence of other heart-valve replacement: Secondary | ICD-10-CM

## 2012-07-19 NOTE — Patient Instructions (Addendum)
Anticoagulation Dose Instructions as of 07/19/2012     Rachel Cantu Tue Wed Thu Fri Sat   New Dose 7.5 mg 7.5 mg 5 mg 7.5 mg 5 mg 7.5 mg 7.5 mg    Description        Then continue 7.5mg  daily except 5mg  on Tuesday and Thursday only. Recheck in 4 weeks.

## 2012-07-31 ENCOUNTER — Encounter: Payer: Self-pay | Admitting: Family

## 2012-08-18 ENCOUNTER — Ambulatory Visit (INDEPENDENT_AMBULATORY_CARE_PROVIDER_SITE_OTHER): Payer: BC Managed Care – PPO | Admitting: Family

## 2012-08-18 DIAGNOSIS — Z954 Presence of other heart-valve replacement: Secondary | ICD-10-CM

## 2012-08-18 DIAGNOSIS — Z952 Presence of prosthetic heart valve: Secondary | ICD-10-CM

## 2012-08-18 NOTE — Patient Instructions (Addendum)
Anticoagulation Dose Instructions as of 08/18/2012     Glynis Smiles Tue Wed Thu Fri Sat   New Dose 7.5 mg 7.5 mg 5 mg 7.5 mg 5 mg 7.5 mg 7.5 mg    Description       Continue same dose 7.5mg  daily except 5mg  on Tuesday and Thursday only. Recheck in 4 weeks.

## 2012-09-15 ENCOUNTER — Ambulatory Visit (INDEPENDENT_AMBULATORY_CARE_PROVIDER_SITE_OTHER): Payer: BC Managed Care – PPO | Admitting: Family

## 2012-09-15 DIAGNOSIS — Z952 Presence of prosthetic heart valve: Secondary | ICD-10-CM

## 2012-09-15 DIAGNOSIS — Z954 Presence of other heart-valve replacement: Secondary | ICD-10-CM

## 2012-09-15 NOTE — Patient Instructions (Addendum)
Continue same dose 7.5mg  daily except 5mg  on Tuesday and Thursday only. Recheck in 4 weeks.   Anticoagulation Dose Instructions as of 09/15/2012     Glynis Smiles Tue Wed Thu Fri Sat   New Dose 7.5 mg 7.5 mg 5 mg 7.5 mg 5 mg 7.5 mg 7.5 mg    Description       Continue same dose 7.5mg  daily except 5mg  on Tuesday and Thursday only. Recheck in 4 weeks.

## 2012-09-27 ENCOUNTER — Other Ambulatory Visit: Payer: Self-pay | Admitting: Family Medicine

## 2012-09-27 MED ORDER — VALSARTAN-HYDROCHLOROTHIAZIDE 80-12.5 MG PO TABS: ORAL_TABLET | ORAL | Status: DC

## 2012-10-11 ENCOUNTER — Ambulatory Visit (INDEPENDENT_AMBULATORY_CARE_PROVIDER_SITE_OTHER): Payer: BC Managed Care – PPO | Admitting: Family

## 2012-10-11 DIAGNOSIS — Z954 Presence of other heart-valve replacement: Secondary | ICD-10-CM

## 2012-10-11 DIAGNOSIS — Z952 Presence of prosthetic heart valve: Secondary | ICD-10-CM

## 2012-10-11 LAB — POCT INR: INR: 2.4

## 2012-10-11 NOTE — Patient Instructions (Signed)
Continue same dose 7.5mg  daily except 5mg  on Tuesday and Thursday only. Recheck in 4 weeks.   Anticoagulation Dose Instructions as of 10/11/2012     Rachel Cantu Tue Wed Thu Fri Sat   New Dose 7.5 mg 7.5 mg 5 mg 7.5 mg 5 mg 7.5 mg 7.5 mg    Description       Continue same dose 7.5mg  daily except 5mg  on Tuesday and Thursday only. Recheck in 4 weeks.

## 2012-11-09 ENCOUNTER — Ambulatory Visit (INDEPENDENT_AMBULATORY_CARE_PROVIDER_SITE_OTHER): Payer: BC Managed Care – PPO | Admitting: General Practice

## 2012-11-09 DIAGNOSIS — Z7901 Long term (current) use of anticoagulants: Secondary | ICD-10-CM

## 2012-11-09 DIAGNOSIS — Z952 Presence of prosthetic heart valve: Secondary | ICD-10-CM

## 2012-11-09 DIAGNOSIS — Z954 Presence of other heart-valve replacement: Secondary | ICD-10-CM

## 2012-11-09 LAB — POCT INR: INR: 2.3

## 2012-11-15 ENCOUNTER — Other Ambulatory Visit: Payer: Self-pay | Admitting: Internal Medicine

## 2012-11-16 ENCOUNTER — Ambulatory Visit (INDEPENDENT_AMBULATORY_CARE_PROVIDER_SITE_OTHER): Payer: BC Managed Care – PPO | Admitting: General Practice

## 2012-11-16 DIAGNOSIS — Z952 Presence of prosthetic heart valve: Secondary | ICD-10-CM

## 2012-11-16 DIAGNOSIS — Z7901 Long term (current) use of anticoagulants: Secondary | ICD-10-CM

## 2012-11-16 DIAGNOSIS — Z954 Presence of other heart-valve replacement: Secondary | ICD-10-CM

## 2012-11-16 LAB — POCT INR: INR: 2.2

## 2012-11-21 ENCOUNTER — Other Ambulatory Visit: Payer: Self-pay | Admitting: Internal Medicine

## 2012-11-23 ENCOUNTER — Ambulatory Visit: Payer: BC Managed Care – PPO

## 2012-11-27 ENCOUNTER — Ambulatory Visit: Payer: BC Managed Care – PPO | Admitting: Internal Medicine

## 2012-11-27 ENCOUNTER — Ambulatory Visit: Payer: BC Managed Care – PPO

## 2012-12-06 ENCOUNTER — Ambulatory Visit: Payer: BC Managed Care – PPO | Admitting: Internal Medicine

## 2012-12-18 ENCOUNTER — Ambulatory Visit (INDEPENDENT_AMBULATORY_CARE_PROVIDER_SITE_OTHER): Payer: BC Managed Care – PPO | Admitting: General Practice

## 2012-12-18 ENCOUNTER — Ambulatory Visit (INDEPENDENT_AMBULATORY_CARE_PROVIDER_SITE_OTHER): Payer: BC Managed Care – PPO | Admitting: Internal Medicine

## 2012-12-18 ENCOUNTER — Encounter: Payer: Self-pay | Admitting: Internal Medicine

## 2012-12-18 VITALS — BP 158/92 | HR 77 | Temp 98.5°F | Wt 182.0 lb

## 2012-12-18 DIAGNOSIS — Z952 Presence of prosthetic heart valve: Secondary | ICD-10-CM

## 2012-12-18 DIAGNOSIS — I059 Rheumatic mitral valve disease, unspecified: Secondary | ICD-10-CM

## 2012-12-18 DIAGNOSIS — Z954 Presence of other heart-valve replacement: Secondary | ICD-10-CM

## 2012-12-18 DIAGNOSIS — Z23 Encounter for immunization: Secondary | ICD-10-CM

## 2012-12-18 DIAGNOSIS — I10 Essential (primary) hypertension: Secondary | ICD-10-CM

## 2012-12-18 NOTE — Patient Instructions (Addendum)
blood pressure is up today and may be temporary elevation but would need to adjust.  Medication. To control readings.  Please check readings at home  And then bring in monitor when you come in for INR check and to see if your  Machine correlates with our readings.  Sept 22 .   4 30 .   appt will arrange for your Bp to be checked  Decrease procesed foods and  Limit simple carbs . This can help you lose weight.  Increase activity.  Due for labs  At next visit please get this at you next coumadin inr check. Colon cancer screening  .  Advised . You may me due for pap smear usually done every 3 years.

## 2012-12-18 NOTE — Progress Notes (Signed)
Chief Complaint  Patient presents with  . Follow-up    HPI: Patient comes in today for yearly check   Last ov august 2013  Seen in coumadin anticoagulation clinic today and blood was a bit too thin Changes.   In coumadin   .   Today .  Some current surgeons about a number of changes in the Coumadin clinic. He is come back in 2 weeks HT:   Has not been checking her blood pressure readings was fine last year he does have a monitor at home but doesn't use ago she's not sure if it's accurate. Taking medication although different vendor began end of June beginning of July pharmacy checked MVR: No chest pain shortness of breath patient states she can feel in her blood is too thick or too thin she sees Dr. Tenny Craw once a year HCM: She may be overdue for Pap smear usually gets that her gynecologist and may be over 3 years. Moms poa .    New vendor  ujne July of her generic Diovan HCTZ. Anticoagulation  Up  inr3.8  ROS: See pertinent positives and negatives per HPI. Very busy get stressed at times blood pressure goes up when that happens but no cardiovascular pulmonary symptoms no unusual bleeding. Okay to give flu vaccine today Needs to make regular time for exercise  And not using.  Has exercise room.    Past Medical History  Diagnosis Date  . Hypertension   . History of chickenpox   . S/P MVR (mitral valve replacement) 1994  . Rheumatic fever     as a child    Family History  Problem Relation Age of Onset  . Heart disease Father     premature age 86    History   Social History  . Marital Status: Divorced    Spouse Name: N/A    Number of Children: N/A  . Years of Education: N/A   Social History Main Topics  . Smoking status: Never Smoker   . Smokeless tobacco: None  . Alcohol Use: No  . Drug Use: None  . Sexual Activity: None   Other Topics Concern  . None   Social History Narrative   hh of 2    G1P1   Divorced    Outpatient Encounter Prescriptions as of 12/18/2012   Medication Sig Dispense Refill  . valsartan-hydrochlorothiazide (DIOVAN-HCT) 80-12.5 MG per tablet TAKE 1 TABLET BY MOUTH EVERY DAY  30 tablet  0  . warfarin (COUMADIN) 5 MG tablet Take 1-2 tablets (5-10 mg total) by mouth daily.  60 tablet  3   No facility-administered encounter medications on file as of 12/18/2012.    EXAM:  BP 158/92  Pulse 77  Temp(Src) 98.5 F (36.9 C) (Oral)  Wt 182 lb (82.555 kg)  BMI 30.77 kg/m2  SpO2 98%  Body mass index is 30.77 kg/(m^2).  GENERAL: vitals reviewed and listed above, alert, oriented, appears well hydrated and in no acute distress talkative normal cognition  HEENT: atraumatic, conjunctiva  clear, no obvious abnormalities on inspection of external nose and ears OP : no lesion edema or exudate   NECK: no obvious masses on inspection palpation no bruits are heard  LUNGS: clear to auscultation bilaterally, no wheezes, rales or rhonchi, good air movement  CV: HRRR, no clubbing cyanosis or  peripheral edema nl cap refill crisp heart valve sounds no gallops  MS: moves all extremities without noticeable focal  abnormality Neurologic grossly nonfocal no tremor gait within normal limits  Skin no noted bruising bleeding. PSYCH: pleasant and cooperative, no obvious depression or anxiety Lab Results  Component Value Date   WBC 6.3 11/23/2011   HGB 13.5 11/23/2011   HCT 41.5 11/23/2011   PLT 238.0 11/23/2011   GLUCOSE 105* 11/23/2011   CHOL 244* 11/23/2011   TRIG 167.0* 11/23/2011   HDL 54.40 11/23/2011   LDLDIRECT 167.7 11/23/2011   LDLCALC 115* 06/13/2007   ALT 16 11/23/2011   AST 25 11/23/2011   NA 138 11/23/2011   K 3.7 11/23/2011   CL 104 11/23/2011   CREATININE 0.7 11/23/2011   BUN 17 11/23/2011   CO2 25 11/23/2011   TSH 0.85 11/23/2011   INR 3.8 12/18/2012    ASSESSMENT AND PLAN:  Discussed the following assessment and plan:  HYPERTENSION - Plan: Flu Vaccine QUAD 36+ mos PF IM (Fluarix), Basic metabolic panel, CBC with Differential, Hepatic  function panel, Lipid panel, TSH  Mitral valve disorders - Plan: Flu Vaccine QUAD 36+ mos PF IM (Fluarix), Basic metabolic panel, CBC with Differential, Hepatic function panel, Lipid panel, TSH  STATUS, HEART VALVE REPLACEMENT NEC - Plan: Flu Vaccine QUAD 36+ mos PF IM (Fluarix), Basic metabolic panel, CBC with Differential, Hepatic function panel, Lipid panel, TSH  Mechanical heart valve present - Plan: Flu Vaccine QUAD 36+ mos PF IM (Fluarix), Basic metabolic panel, CBC with Differential, Hepatic function panel, Lipid panel, TSH  Need for prophylactic vaccination and inoculation against influenza - Plan: Flu Vaccine QUAD 36+ mos PF IM (Fluarix), Basic metabolic panel, CBC with Differential, Hepatic function panel, Lipid panel, TSH Blood pressure readings are up today uncertain if from recent stress of the day versus other knee better monitoring discussed changing blood pressure medication if indeed continued elevated will arrange for nursing visit when she comes back for her pro time September 22 although I will be in the office. Have her monitor her compares with our reading she can monitor at home and we can adjust her medication as appropriate. She is due for yearly monitoring review health care maintenance up-to-date versus overdue for Pap etc. Uncertain about her colon cancer screening she has had some type of partial procedure done question sigmoid but I cannot see it in the electronic record. She should check into this. Flu vaccine given today. -Patient advised to return or notify health care team  if symptoms worsen or persist or new concerns arise.  Patient Instructions  blood pressure is up today and may be temporary elevation but would need to adjust.  Medication. To control readings.  Please check readings at home  And then bring in monitor when you come in for INR check and to see if your  Machine correlates with our readings.  Sept 22 .   4 30 .   appt will arrange for your Bp to be  checked  Decrease procesed foods and  Limit simple carbs . This can help you lose weight.  Increase activity.  Due for labs  At next visit please get this at you next coumadin inr check. Colon cancer screening  .  Advised . You may me due for pap smear usually done every 3 years.          Neta Mends. Gunhild Bautch M.D. Health Maintenance  Topic Date Due  . Tetanus/tdap  02/21/1968  . Colonoscopy  02/21/1999  . Zostavax  02/20/2009  . Mammogram  08/10/2009  . Pap Smear  09/11/2011  . Influenza Vaccine  11/10/2013   Health Maintenance Review

## 2012-12-20 ENCOUNTER — Other Ambulatory Visit: Payer: Self-pay | Admitting: Internal Medicine

## 2012-12-25 ENCOUNTER — Telehealth: Payer: Self-pay | Admitting: Internal Medicine

## 2012-12-25 NOTE — Telephone Encounter (Signed)
Pt needs refill on generic diovan 80-12.5mg  sent to [facility]. Pt stated pharm never received last request

## 2012-12-25 NOTE — Telephone Encounter (Signed)
Called and spoke to [place] and [employer].  The medication was received and it is ready for pick up.  No further action needed.

## 2013-01-01 ENCOUNTER — Telehealth: Payer: Self-pay | Admitting: Internal Medicine

## 2013-01-01 ENCOUNTER — Encounter: Payer: BC Managed Care – PPO | Admitting: Family Medicine

## 2013-01-01 ENCOUNTER — Ambulatory Visit (INDEPENDENT_AMBULATORY_CARE_PROVIDER_SITE_OTHER): Payer: BC Managed Care – PPO | Admitting: Internal Medicine

## 2013-01-01 ENCOUNTER — Ambulatory Visit (INDEPENDENT_AMBULATORY_CARE_PROVIDER_SITE_OTHER): Payer: BC Managed Care – PPO | Admitting: General Practice

## 2013-01-01 ENCOUNTER — Encounter: Payer: Self-pay | Admitting: Internal Medicine

## 2013-01-01 ENCOUNTER — Encounter: Payer: Self-pay | Admitting: General Practice

## 2013-01-01 VITALS — BP 160/100 | HR 82 | Temp 99.1°F | Resp 20 | Wt 180.0 lb

## 2013-01-01 DIAGNOSIS — I10 Essential (primary) hypertension: Secondary | ICD-10-CM

## 2013-01-01 DIAGNOSIS — Z7901 Long term (current) use of anticoagulants: Secondary | ICD-10-CM

## 2013-01-01 DIAGNOSIS — Z954 Presence of other heart-valve replacement: Secondary | ICD-10-CM

## 2013-01-01 DIAGNOSIS — Z952 Presence of prosthetic heart valve: Secondary | ICD-10-CM

## 2013-01-01 DIAGNOSIS — Z5181 Encounter for therapeutic drug level monitoring: Secondary | ICD-10-CM

## 2013-01-01 LAB — POCT INR: INR: 3.7

## 2013-01-01 NOTE — Telephone Encounter (Signed)
Pt would like the 4 pm appt on 10/7/ it is a sda. Is that ok?

## 2013-01-01 NOTE — Progress Notes (Signed)
Subjective:    Patient ID: Rachel Cantu, female    DOB: December 07, 1950, 62 y.o.   MRN: [id number]  HPI  BP Readings from Last 3 Encounters:  01/01/13 160/100  01/01/13 162/110  12/18/12 158/92    Wt Readings from Last 3 Encounters:  01/01/13 180 lb (81.647 kg)  12/18/12 182 lb (82.555 kg)  01/05/12 177 lb (80.287 kg)    Lab Results  Component Value Date   INR 3.7 01/01/2013   INR 3.8 12/18/2012   INR 2.2 11/16/2012   62 year old patient who is on chronic Coumadin anticoagulation for prosthetic mitral valve. She was seen earlier today for INR monitoring and noted to have elevated blood pressure. Blood pressure was mildly elevated at her last office visit about 2 weeks ago. She was seen today as a work in due to her elevated BP in the absence of her PCP  Past Medical History  Diagnosis Date  . Hypertension   . History of chickenpox   . S/P MVR (mitral valve replacement) 1994  . Rheumatic fever     as a child    History   Social History  . Marital Status: Divorced    Spouse Name: N/A    Number of Children: N/A  . Years of Education: N/A   Occupational History  . Not on file.   Social History Main Topics  . Smoking status: Never Smoker   . Smokeless tobacco: Not on file  . Alcohol Use: No  . Drug Use: Not on file  . Sexual Activity: Not on file   Other Topics Concern  . Not on file   Social History Narrative   hh of 2    G1P1   Divorced    Past Surgical History  Procedure Laterality Date  . Mitral valve replacement  1994    Family History  Problem Relation Age of Onset  . Heart disease Father     premature age 74    Allergies  Allergen Reactions  . Sulfamethoxazole     REACTION: unspecified    Current Outpatient Prescriptions on File Prior to Visit  Medication Sig Dispense Refill  . valsartan-hydrochlorothiazide (DIOVAN-HCT) 80-12.5 MG per tablet TAKE 1 TABLET BY MOUTH DAILY  30 tablet  5  . warfarin (COUMADIN) 5 MG tablet Take 1-2 tablets  (5-10 mg total) by mouth daily.  60 tablet  3   No current facility-administered medications on file prior to visit.    BP 160/100  Pulse 82  Temp(Src) 99.1 F (37.3 C) (Oral)  Resp 20  Wt 180 lb (81.647 kg)  BMI 30.43 kg/m2  SpO2 98%     Review of Systems  Constitutional: Negative.   HENT: Negative for hearing loss, congestion, sore throat, rhinorrhea, dental problem, sinus pressure and tinnitus.   Eyes: Negative for pain, discharge and visual disturbance.  Respiratory: Negative for cough and shortness of breath.   Cardiovascular: Negative for chest pain, palpitations and leg swelling.  Gastrointestinal: Negative for nausea, vomiting, abdominal pain, diarrhea, constipation, blood in stool and abdominal distention.  Genitourinary: Negative for dysuria, urgency, frequency, hematuria, flank pain, vaginal bleeding, vaginal discharge, difficulty urinating, vaginal pain and pelvic pain.  Musculoskeletal: Negative for joint swelling, arthralgias and gait problem.  Skin: Negative for rash.  Neurological: Negative for dizziness, syncope, speech difficulty, weakness, numbness and headaches.  Hematological: Negative for adenopathy.  Psychiatric/Behavioral: Negative for behavioral problems, dysphoric mood and agitation. The patient is not nervous/anxious.        Objective:  Physical Exam  Constitutional: She appears well-developed and well-nourished. No distress.  Somewhat anxious  Blood pressure 160/100          Assessment & Plan:   Hypertension. Suboptimal control. The patient appears to be slightly anxious and mildly agitated this afternoon about issues with Coumadin monitoring. Nonpharmacologic measures discussed. We'll increase combination medicines to 2 daily and recheck in one week. Home blood pressure monitoring encouraged

## 2013-01-01 NOTE — Patient Instructions (Addendum)
Take 2 blood pressure medications once daily  Limit your sodium (Salt) intake  Please check your blood pressure on a regular basis.  If it is consistently greater than 150/90, please make an office appointment.  Followup with Dr. Fabian Sharp in one week

## 2013-01-02 NOTE — Telephone Encounter (Signed)
Done

## 2013-01-02 NOTE — Telephone Encounter (Signed)
Have pt come in at 3:45 and use the 4 o'clock appt as well to make a 30 minute appt.  Thanks!!

## 2013-01-05 ENCOUNTER — Telehealth: Payer: Self-pay | Admitting: Internal Medicine

## 2013-01-05 MED ORDER — VALSARTAN-HYDROCHLOROTHIAZIDE 80-12.5 MG PO TABS: 2.0000 | ORAL_TABLET | Freq: Every day | ORAL | Status: DC

## 2013-01-05 NOTE — Telephone Encounter (Signed)
Pt called and stated that she would like a refill of her valsartan-hydrochlorothiazide (DIOVAN-HCT) 80-12.5 MG per tablet. She states that she does not want valsarten, but she would like diovan. She states that at her last visit her dosage was doubled, and she is not taking 2 pills a day. She would like [facility] [address]. Please assist.

## 2013-01-05 NOTE — Telephone Encounter (Signed)
Sent by e-scribe. 

## 2013-01-09 ENCOUNTER — Telehealth: Payer: Self-pay | Admitting: Internal Medicine

## 2013-01-09 ENCOUNTER — Ambulatory Visit (INDEPENDENT_AMBULATORY_CARE_PROVIDER_SITE_OTHER): Payer: BC Managed Care – PPO | Admitting: Internal Medicine

## 2013-01-09 ENCOUNTER — Encounter: Payer: Self-pay | Admitting: Internal Medicine

## 2013-01-09 VITALS — BP 138/78 | HR 80 | Temp 98.3°F | Wt 179.0 lb

## 2013-01-09 DIAGNOSIS — Z7901 Long term (current) use of anticoagulants: Secondary | ICD-10-CM

## 2013-01-09 DIAGNOSIS — Z954 Presence of other heart-valve replacement: Secondary | ICD-10-CM

## 2013-01-09 DIAGNOSIS — I10 Essential (primary) hypertension: Secondary | ICD-10-CM

## 2013-01-09 DIAGNOSIS — Z5181 Encounter for therapeutic drug level monitoring: Secondary | ICD-10-CM

## 2013-01-09 MED ORDER — VALSARTAN-HYDROCHLOROTHIAZIDE 160-25 MG PO TABS: 1.0000 | ORAL_TABLET | Freq: Every day | ORAL | Status: DC

## 2013-01-09 NOTE — Telephone Encounter (Signed)
Patient's prior auth for DIOVAN-HCT BID was denied. Insurance will only cover 30 tablets for 30 days, not 60 for 30. Please advise.

## 2013-01-09 NOTE — Progress Notes (Signed)
Chief Complaint  Patient presents with  . Hypertension    HPI:  Brings in monitor   BP  Med now increased to two day. And   Doing better .   Aware of anxiety.  Of medical lab contacts  encounters recently  bp was up at home  150 range  Before seeing dr Kirtland Bouchard and now double dose opf med  . No se noted  bp at home in the  High 130 range still goes up with aggravation.  No new sx cp sob  inr was a bit over today .   ROS: See pertinent positives and negatives per HPI. No cp sob new sx bleeding etc interesting that her inrs seem to be more unstable in summer late and fall seems yearly ( on coumadin for about 20 years)  Past Medical History  Diagnosis Date  . Hypertension   . History of chickenpox   . S/P MVR (mitral valve replacement) 1994  . Rheumatic fever     as a child    Family History  Problem Relation Age of Onset  . Heart disease Father     premature age 37    History   Social History  . Marital Status: Divorced    Spouse Name: N/A    Number of Children: N/A  . Years of Education: N/A   Social History Main Topics  . Smoking status: Never Smoker   . Smokeless tobacco: None  . Alcohol Use: No  . Drug Use: None  . Sexual Activity: None   Other Topics Concern  . None   Social History Narrative   hh of 2    G1P1   Divorced    Outpatient Encounter Prescriptions as of 01/09/2013  Medication Sig Dispense Refill  . warfarin (COUMADIN) 5 MG tablet Take 1-2 tablets (5-10 mg total) by mouth daily.  60 tablet  3  . [DISCONTINUED] valsartan-hydrochlorothiazide (DIOVAN HCT) 80-12.5 MG per tablet Take 2 tablets by mouth daily.  60 tablet  0  . [DISCONTINUED] valsartan-hydrochlorothiazide (DIOVAN-HCT) 80-12.5 MG per tablet TAKE 1 TABLET BY MOUTH DAILY  30 tablet  5  . valsartan-hydrochlorothiazide (DIOVAN-HCT) 160-25 MG per tablet Take 1 tablet by mouth daily.  30 tablet  11   No facility-administered encounter medications on file as of 01/09/2013.    EXAM:  BP 138/78   Pulse 80  Temp(Src) 98.3 F (36.8 C) (Oral)  Wt 179 lb (81.194 kg)  BMI 30.26 kg/m2  SpO2 93%  Body mass index is 30.26 kg/(m^2).  GENERAL: vitals reviewed and listed above, alert, oriented, appears well hydrated and in no acute distress BP readings 146/88 left her machine then 138/78 me  Pulse 72  rr   Provider  personally taken  Multiple bp readings  PSYCH: pleasant and cooperative,  Talkative . No change from baseline .   ASSESSMENT AND PLAN:  Discussed the following assessment and plan:  HYPERTENSION - increase dose of medication  STATUS, HEART VALVE REPLACEMENT NEC  Anticoagulated on Coumadin Continue on higher dose  Needs labs in 3-4 weeks orders in record -Patient advised to return or notify health care team  if symptoms worsen or persist or new concerns arise. Update hcm  Except  ?   Screening  for colon cancer.  Not a colon but stool tests ? When has had bes int he past.  May have been done by gyne   She can let us know about this . Agree that colonoscopy has some greater risk  for her  Patient Instructions  Blood pressure is getting better .  Stay on  Higher dose of medication   Valsartan/hctz 160/25    Blood pressure control can continue to improve over the next month with this change . Blood pressure machine    Is good enough to help [place] with  Medication management.   Get the labs ordered in the system  in the   next month .     Make sure mammogram is utd.  Want to make sure you have  Colon cancer screening .   On a regular basis .     Let us know what you blood pressure is in a month and we will let you know results of labs.  If all is ok then can do yearly visit.   Neta Mends. Panosh M.D.  Total visit > 50% spent counseling and coordinating care

## 2013-01-09 NOTE — Telephone Encounter (Signed)
Taken care of at [facility] and med dose changed

## 2013-01-09 NOTE — Patient Instructions (Addendum)
Blood pressure is getting better .  Stay on  Higher dose of medication   Valsartan/hctz 160/25    Blood pressure control can continue to improve over the next month with this change . Blood pressure machine    Is good enough to help [place] with  Medication management.   Get the labs ordered in the system  in the   next month .     Make sure mammogram is utd.  Want to make sure you have  Colon cancer screening .   On a regular basis .     Let us know what you blood pressure is in a month and we will let you know results of labs.  If all is ok then can do yearly visit.

## 2013-01-15 ENCOUNTER — Ambulatory Visit: Payer: BC Managed Care – PPO

## 2013-01-16 ENCOUNTER — Ambulatory Visit (INDEPENDENT_AMBULATORY_CARE_PROVIDER_SITE_OTHER): Payer: BC Managed Care – PPO | Admitting: Family

## 2013-01-16 DIAGNOSIS — Z954 Presence of other heart-valve replacement: Secondary | ICD-10-CM

## 2013-01-16 DIAGNOSIS — Z952 Presence of prosthetic heart valve: Secondary | ICD-10-CM

## 2013-01-16 NOTE — Patient Instructions (Addendum)
Anticoagulation Dose Instructions as of 01/16/2013     Glynis Smiles Tue Wed Thu Fri Sat   New Dose 7.5 mg 7.5 mg 7.5 mg 5 mg 7.5 mg 7.5 mg 7.5 mg    Description       Same dose     Take an extra 1/2 tab today only then continue same dose.

## 2013-02-09 ENCOUNTER — Telehealth: Payer: Self-pay | Admitting: Internal Medicine

## 2013-02-09 ENCOUNTER — Other Ambulatory Visit: Payer: Self-pay | Admitting: Family Medicine

## 2013-02-09 NOTE — Telephone Encounter (Signed)
Pt is having issues w/ her refill (DIOVAN-HCT) 160-25 MG per tablet. Pt was put on non generic. Pharm will not fill rx. Do not understand why. Pt wold like you to call her. Pharm [facility]

## 2013-02-09 NOTE — Telephone Encounter (Signed)
Called and spoke to [employer].  They informed me that she has tried to pick up her rx too early.  Insurance will not pay for it.  They last filled it on 01/16/13.  She will have to pay out of pocket if she wants to get her rx before her insurance approves it.  She will need to call [facility] and find out the date that she can pick it up.  Tried calling the patient.  Left a message at the below listed number.

## 2013-02-12 NOTE — Telephone Encounter (Signed)
Pt returned my call.  Called back and left message at below listed number.

## 2013-02-13 NOTE — Telephone Encounter (Signed)
Left message informing the pt that I was trying to reach her one more time.  If she needed help or had other concerns than she should return my call.  Will now close this note.  Tried reaching the pt 3 times.

## 2013-02-14 ENCOUNTER — Ambulatory Visit (INDEPENDENT_AMBULATORY_CARE_PROVIDER_SITE_OTHER): Payer: BC Managed Care – PPO | Admitting: Family

## 2013-02-14 ENCOUNTER — Telehealth: Payer: Self-pay | Admitting: Family Medicine

## 2013-02-14 DIAGNOSIS — Z952 Presence of prosthetic heart valve: Secondary | ICD-10-CM

## 2013-02-14 DIAGNOSIS — Z954 Presence of other heart-valve replacement: Secondary | ICD-10-CM

## 2013-02-14 NOTE — Patient Instructions (Addendum)
Hold Coumadin today only. Take 1 tablet only tomorrow then continue same dose.   Anticoagulation Dose Instructions as of 02/14/2013     Rachel Cantu Tue Wed Thu Fri Sat   New Dose 7.5 mg 7.5 mg 7.5 mg 5 mg 7.5 mg 7.5 mg 7.5 mg    Description       Take 1 tablet only tomorrow then continue same dose.

## 2013-02-14 NOTE — Telephone Encounter (Signed)
Patient is requesting a refill of her warfarin (COUMADIN) 5 MG tablet #60 with refills.

## 2013-02-15 MED ORDER — WARFARIN SODIUM 5 MG PO TABS: 5.0000 mg | ORAL_TABLET | Freq: Every day | ORAL | Status: DC

## 2013-02-15 NOTE — Addendum Note (Signed)
Addended byAdline Mango B on: 02/15/2013 09:59 AM   Modules accepted: Orders

## 2013-03-14 ENCOUNTER — Ambulatory Visit: Payer: BC Managed Care – PPO | Admitting: Family

## 2013-03-14 DIAGNOSIS — Z0289 Encounter for other administrative examinations: Secondary | ICD-10-CM

## 2013-04-11 ENCOUNTER — Ambulatory Visit (INDEPENDENT_AMBULATORY_CARE_PROVIDER_SITE_OTHER): Payer: BC Managed Care – PPO | Admitting: Family

## 2013-04-11 DIAGNOSIS — Z954 Presence of other heart-valve replacement: Secondary | ICD-10-CM

## 2013-04-11 DIAGNOSIS — Z952 Presence of prosthetic heart valve: Secondary | ICD-10-CM

## 2013-04-11 LAB — POCT INR: INR: 2.1

## 2013-04-11 NOTE — Patient Instructions (Signed)
Take 2 tabs tomorrow and Friday then continue same dose and recheck in 3 weeks  Anticoagulation Dose Instructions as of 04/11/2013     Glynis Smiles Tue Wed Thu Fri Sat   New Dose 7.5 mg 7.5 mg 7.5 mg 5 mg 7.5 mg 7.5 mg 7.5 mg    Description       Take 2 tabs tomorrow and Friday then continue same dose and recheck in 3 weeks

## 2013-05-02 ENCOUNTER — Ambulatory Visit (INDEPENDENT_AMBULATORY_CARE_PROVIDER_SITE_OTHER): Payer: 59 | Admitting: Family

## 2013-05-02 DIAGNOSIS — Z952 Presence of prosthetic heart valve: Secondary | ICD-10-CM

## 2013-05-02 DIAGNOSIS — Z954 Presence of other heart-valve replacement: Secondary | ICD-10-CM

## 2013-05-02 LAB — POCT INR: INR: 3.1

## 2013-05-02 NOTE — Patient Instructions (Signed)
Anticoagulation Dose Instructions as of 05/02/2013     Rachel SmilesSun Mon Tue Wed Thu Fri Sat   New Dose 7.5 mg 7.5 mg 7.5 mg 5 mg 7.5 mg 7.5 mg 7.5 mg    Description       Take 5mg . Tomorrow only. Then resume regular dose.

## 2013-05-18 ENCOUNTER — Telehealth: Payer: Self-pay | Admitting: Internal Medicine

## 2013-05-18 NOTE — Telephone Encounter (Signed)
Pt is needing rx valsartan-hydrochlorothiazide (DIOVAN-HCT) 160-25 MG per tablet, [facility] [address]. Pt states she only has 1 tablet

## 2013-05-21 ENCOUNTER — Telehealth: Payer: Self-pay | Admitting: Internal Medicine

## 2013-05-21 NOTE — Telephone Encounter (Signed)
Called and spoke to the pharmacy.  Patient does not need refills.  She need a prior authorization.  Informed pharmacy that staff is currently working on her prior authorization.  Pharmacy stated the pt has medication.  Informed the pharmacy that we will notify when the prior authorization is complete.

## 2013-05-21 NOTE — Telephone Encounter (Signed)
[employer] [facility] denied the PA request for Diovan.  I called customer service and was advised that Diovan is excluded from pt's plan.  Irbesartan HCTZ, Benicar HCTZ, Losartan HCTZ, Telmisartan HCTZ, Valsartan HCTZ, and Lisinopril HCTZ are covered.  Please advise if you would like a letter of medical necessity sent to the appeal department for the [phone]

## 2013-05-21 NOTE — Telephone Encounter (Signed)
Inform patient that insurance will not pay for brand medication at this time  . Of the diovan hctz  We can try generic again and see how her BP does/ Ask hwe what seh wishes to tod and give her the names of medications on the list.

## 2013-05-23 NOTE — Telephone Encounter (Signed)
Left message on home phone for the pt to return my call. 

## 2013-05-24 ENCOUNTER — Other Ambulatory Visit: Payer: Self-pay | Admitting: Family Medicine

## 2013-05-24 NOTE — Telephone Encounter (Signed)
Pt called and would like a CB asap!! Thanks!

## 2013-05-24 NOTE — Telephone Encounter (Signed)
Pt had a allergic reaction to the generic Diovan.  She is okay with your picking one of the medications below.  Please choose the closest to Diovan.

## 2013-05-24 NOTE — Telephone Encounter (Signed)
Lets change to benicar hctz 20/25 1 po qd  See if we have samples   To give her for a month to start to make sure she tolerates well and bp is ok .  Rx benicar 20/25 hctz  1 po qd  Dip 30 refill x 5   BMP in 1 month after therapy begun and ROV after that ( end of day ok to work her in)

## 2013-05-25 ENCOUNTER — Other Ambulatory Visit: Payer: Self-pay | Admitting: Family Medicine

## 2013-05-25 MED ORDER — OLMESARTAN MEDOXOMIL-HCTZ 40-12.5 MG PO TABS: 1.0000 | ORAL_TABLET | Freq: Every day | ORAL | Status: DC

## 2013-05-25 NOTE — Telephone Encounter (Signed)
Per WP, Benicar does not come in the originally prescribed dose of 20/25 so it should be changed to 40/12.5.

## 2013-05-25 NOTE — Telephone Encounter (Signed)
Patient was notified of samples and that she should pick them up at the front desk. Also notified her that I sent in a new prescription to the pharmacy.

## 2013-05-30 ENCOUNTER — Ambulatory Visit: Payer: 59 | Admitting: Family

## 2013-06-06 ENCOUNTER — Telehealth: Payer: Self-pay | Admitting: Internal Medicine

## 2013-06-06 NOTE — Telephone Encounter (Signed)
Pt needs samples of benicar

## 2013-06-06 NOTE — Telephone Encounter (Signed)
Pt notified to pick up at the front desk. 

## 2013-06-08 ENCOUNTER — Ambulatory Visit (INDEPENDENT_AMBULATORY_CARE_PROVIDER_SITE_OTHER): Payer: 59 | Admitting: Family

## 2013-06-08 DIAGNOSIS — Z952 Presence of prosthetic heart valve: Secondary | ICD-10-CM

## 2013-06-08 DIAGNOSIS — I059 Rheumatic mitral valve disease, unspecified: Secondary | ICD-10-CM

## 2013-06-08 DIAGNOSIS — Z954 Presence of other heart-valve replacement: Secondary | ICD-10-CM

## 2013-06-08 LAB — POCT INR: INR: 2.9

## 2013-06-08 NOTE — Patient Instructions (Signed)
Anticoagulation Dose Instructions as of 06/08/2013     Glynis SmilesSun Mon Tue Wed Thu Fri Sat   New Dose 7.5 mg 7.5 mg 7.5 mg 5 mg 7.5 mg 7.5 mg 7.5 mg    Description       Continue current dosage.

## 2013-06-23 ENCOUNTER — Other Ambulatory Visit: Payer: Self-pay | Admitting: Family

## 2013-07-20 ENCOUNTER — Ambulatory Visit: Payer: 59 | Admitting: Family

## 2013-07-20 DIAGNOSIS — Z0289 Encounter for other administrative examinations: Secondary | ICD-10-CM

## 2013-08-10 ENCOUNTER — Other Ambulatory Visit: Payer: 59

## 2013-08-10 ENCOUNTER — Other Ambulatory Visit: Payer: 59 | Admitting: Family

## 2013-08-10 ENCOUNTER — Ambulatory Visit (INDEPENDENT_AMBULATORY_CARE_PROVIDER_SITE_OTHER): Payer: 59 | Admitting: Family

## 2013-08-10 DIAGNOSIS — Z7901 Long term (current) use of anticoagulants: Secondary | ICD-10-CM

## 2013-08-10 DIAGNOSIS — Z952 Presence of prosthetic heart valve: Secondary | ICD-10-CM

## 2013-08-10 DIAGNOSIS — Z954 Presence of other heart-valve replacement: Secondary | ICD-10-CM

## 2013-08-10 LAB — POCT INR: INR: 4.4

## 2013-08-10 NOTE — Patient Instructions (Signed)
Hold coumadin today and tomorrow, then take 1.5 tabs all days except Wed and Friday take 1 tablet. Recheck in 10 days   Anticoagulation Dose Instructions as of 08/10/2013     Glynis SmilesSun Mon Tue Wed Thu Fri Sat   New Dose 7.5 mg 7.5 mg 7.5 mg 5 mg 7.5 mg 5 mg 7.5 mg    Description       Hold coumadin today and tomorrow, then take 1.5 tabs all days except Wed and Friday take 1 tablet. Recheck in 10 days

## 2013-08-21 ENCOUNTER — Ambulatory Visit (INDEPENDENT_AMBULATORY_CARE_PROVIDER_SITE_OTHER): Payer: 59 | Admitting: Family

## 2013-08-21 DIAGNOSIS — Z952 Presence of prosthetic heart valve: Secondary | ICD-10-CM

## 2013-08-21 DIAGNOSIS — Z954 Presence of other heart-valve replacement: Secondary | ICD-10-CM

## 2013-08-22 LAB — POCT INR: INR: 2.2

## 2013-08-22 NOTE — Patient Instructions (Signed)
Take an extra 1/2 tab today only. Recheck in 3 weeks.  Anticoagulation Dose Instructions as of 08/21/2013     Rachel SmilesSun Mon Tue Wed Thu Fri Sat   New Dose 7.5 mg 7.5 mg 7.5 mg 5 mg 7.5 mg 5 mg 7.5 mg    Description       Take an extra 1/2 tab today only. Recheck in 3 weeks.

## 2013-09-18 ENCOUNTER — Ambulatory Visit: Payer: 59 | Admitting: Family

## 2013-09-18 DIAGNOSIS — Z0289 Encounter for other administrative examinations: Secondary | ICD-10-CM

## 2013-09-21 ENCOUNTER — Ambulatory Visit (INDEPENDENT_AMBULATORY_CARE_PROVIDER_SITE_OTHER): Payer: 59 | Admitting: Family

## 2013-09-21 DIAGNOSIS — Z954 Presence of other heart-valve replacement: Secondary | ICD-10-CM

## 2013-09-21 DIAGNOSIS — Z952 Presence of prosthetic heart valve: Secondary | ICD-10-CM

## 2013-09-21 LAB — POCT INR: INR: 3.7

## 2013-09-21 NOTE — Patient Instructions (Signed)
Take 5mg  only tomorrow, then continue current dosage. Recheck in 3 weeks.  Anticoagulation Dose Instructions as of 09/21/2013     Rachel SmilesSun Mon Tue Wed Thu Fri Sat   New Dose 7.5 mg 7.5 mg 7.5 mg 5 mg 7.5 mg 5 mg 7.5 mg    Description       Take 5mg  only tomorrow, then continue current dosage. Recheck in 3 weeks.

## 2013-10-08 ENCOUNTER — Telehealth: Payer: Self-pay | Admitting: Internal Medicine

## 2013-10-08 NOTE — Telephone Encounter (Signed)
Left message for pt to call back and schedule

## 2013-10-08 NOTE — Telephone Encounter (Signed)
Pt needs an appointment slot and we have nothing available for her time preference. Please schedule for Wednesday at 4

## 2013-10-08 NOTE — Telephone Encounter (Signed)
Pt is wanting to know if she can come in tomorrow and have her coumadin by  Oran ReinPadonda, states just let her know what time to come before Padonda leaves for the day.

## 2013-10-08 NOTE — Telephone Encounter (Signed)
Pt agreed to pick-up a hard script to have PT INR drawn at a specimen lab.  She will come in the morning to pick it up.

## 2013-10-09 ENCOUNTER — Ambulatory Visit (INDEPENDENT_AMBULATORY_CARE_PROVIDER_SITE_OTHER): Payer: 59 | Admitting: Family

## 2013-10-09 DIAGNOSIS — Z954 Presence of other heart-valve replacement: Secondary | ICD-10-CM

## 2013-10-09 DIAGNOSIS — Z952 Presence of prosthetic heart valve: Secondary | ICD-10-CM

## 2013-10-09 LAB — POCT INR: INR: 2.4

## 2013-10-09 NOTE — Patient Instructions (Signed)
Take 2 tab today only, then continue current dosage. Recheck in 4 weeks.   Anticoagulation Dose Instructions as of 10/09/2013     Rachel SmilesSun Mon Tue Wed Thu Fri Sat   New Dose 7.5 mg 7.5 mg 7.5 mg 5 mg 7.5 mg 5 mg 7.5 mg    Description       Take 2 tab today only, then continue current dosage. Recheck in 4 weeks.

## 2013-10-09 NOTE — Telephone Encounter (Signed)
Noted  

## 2013-10-19 ENCOUNTER — Ambulatory Visit: Payer: 59 | Admitting: Family

## 2013-11-06 ENCOUNTER — Ambulatory Visit: Payer: 59 | Admitting: Family

## 2013-11-06 DIAGNOSIS — Z0289 Encounter for other administrative examinations: Secondary | ICD-10-CM

## 2013-12-14 ENCOUNTER — Telehealth: Payer: Self-pay | Admitting: Internal Medicine

## 2013-12-14 NOTE — Telephone Encounter (Signed)
Pt needs samples of benicar 40 mg-12.5 mg

## 2013-12-14 NOTE — Telephone Encounter (Signed)
LMOM for the pt to return my call. 

## 2013-12-18 ENCOUNTER — Telehealth: Payer: Self-pay | Admitting: Internal Medicine

## 2013-12-18 NOTE — Telephone Encounter (Signed)
Pt got your message and she is fine. [facility] came through for her.

## 2013-12-18 NOTE — Telephone Encounter (Signed)
Left a message on work/cell informing the pt that we do not have samples of this medication.

## 2013-12-20 ENCOUNTER — Other Ambulatory Visit: Payer: Self-pay | Admitting: Family

## 2014-01-17 ENCOUNTER — Telehealth: Payer: Self-pay | Admitting: Family

## 2014-01-17 NOTE — Telephone Encounter (Signed)
lmom for pt to cb to sch appt °

## 2014-01-17 NOTE — Telephone Encounter (Signed)
Please call and schedule an INR visit for patient ASAP.

## 2014-01-22 NOTE — Telephone Encounter (Signed)
lmom for pt to sch appt °

## 2014-01-24 NOTE — Telephone Encounter (Signed)
Pt would like to know if she can come in between 3:45-4:15 for her Coumadin check on 02/08/14 due to her work schedule?? Please advise

## 2014-01-28 NOTE — Telephone Encounter (Signed)
LMOM with Padonda's below reply

## 2014-01-28 NOTE — Telephone Encounter (Signed)
Yes. Must be in by 4:15pm

## 2014-02-08 ENCOUNTER — Ambulatory Visit (INDEPENDENT_AMBULATORY_CARE_PROVIDER_SITE_OTHER): Payer: Self-pay | Admitting: Family

## 2014-02-08 DIAGNOSIS — Z954 Presence of other heart-valve replacement: Secondary | ICD-10-CM

## 2014-02-08 DIAGNOSIS — Z952 Presence of prosthetic heart valve: Secondary | ICD-10-CM

## 2014-02-08 LAB — POCT INR: INR: 4.9

## 2014-02-08 NOTE — Patient Instructions (Signed)
Hold coumadin today, take 1 tablet only Saturday. Then continue current dosage. Eat an extra serving of green veggies. Recheck in 2 weeks.   Anticoagulation Dose Instructions as of 02/08/2014     Rachel SmilesSun Mon Tue Wed Thu Fri Sat   New Dose 7.5 mg 7.5 mg 7.5 mg 5 mg 7.5 mg 5 mg 7.5 mg    Description       Hold coumadin today, take 1 tablet only Saturday. Then continue current dosage. Eat an extra serving of green veggies. Recheck in 2 weeks.

## 2014-02-13 ENCOUNTER — Telehealth: Payer: Self-pay | Admitting: Internal Medicine

## 2014-02-13 NOTE — Telephone Encounter (Signed)
Pt stated NP would like to see her in 2 wk for protime. NP is not in office please advise

## 2014-02-13 NOTE — Telephone Encounter (Signed)
May schedule lab appt or be seen at [facility]

## 2014-02-18 NOTE — Telephone Encounter (Signed)
lmom for pt to cb

## 2014-02-21 ENCOUNTER — Telehealth: Payer: Self-pay | Admitting: Internal Medicine

## 2014-02-21 NOTE — Telephone Encounter (Signed)
Pt already sch 03-06-14

## 2014-02-21 NOTE — Telephone Encounter (Signed)
Pt needs rx COUMADIN 5 MG tablet.  [facility] Pt will not get coum check until 11/25 and needs refill now.

## 2014-02-22 MED ORDER — WARFARIN SODIUM 5 MG PO TABS: ORAL_TABLET | ORAL | Status: DC

## 2014-02-22 NOTE — Telephone Encounter (Signed)
Sent to the pharmacy #[id number].  Pt has upcoming appt on 03/06/14.

## 2014-02-24 ENCOUNTER — Other Ambulatory Visit: Payer: Self-pay | Admitting: Family

## 2014-03-06 ENCOUNTER — Ambulatory Visit (INDEPENDENT_AMBULATORY_CARE_PROVIDER_SITE_OTHER): Payer: Self-pay | Admitting: Family

## 2014-03-06 DIAGNOSIS — Z952 Presence of prosthetic heart valve: Secondary | ICD-10-CM

## 2014-03-06 DIAGNOSIS — Z954 Presence of other heart-valve replacement: Secondary | ICD-10-CM

## 2014-03-06 LAB — POCT INR: INR: 4.1

## 2014-03-06 NOTE — Patient Instructions (Signed)
Hold Coumadin today. Then decrease current dosage to 5 mg on Mondays, Wednesdays, and Fridays. All other days, 7.5mg . Eat an extra serving of green veggies. Recheck in 3 weeks.   Anticoagulation Dose Instructions as of 03/06/2014      Rachel Cantu Mon Tue Wed Thu Fri Sat   New Dose 7.5 mg 5 mg 7.5 mg 5 mg 7.5 mg 5 mg 7.5 mg    Description        Hold Coumadin today. Then decrease current dosage to 5 mg on Mondays, Wednesdays, and Fridays. All other days, 7.5mg . Eat an extra serving of green veggies. Recheck in 3 weeks.

## 2014-03-27 ENCOUNTER — Ambulatory Visit: Payer: Self-pay | Admitting: Family

## 2014-03-27 ENCOUNTER — Ambulatory Visit: Payer: Self-pay

## 2014-03-29 ENCOUNTER — Ambulatory Visit (INDEPENDENT_AMBULATORY_CARE_PROVIDER_SITE_OTHER): Payer: Self-pay | Admitting: Family

## 2014-03-29 DIAGNOSIS — Z954 Presence of other heart-valve replacement: Secondary | ICD-10-CM

## 2014-03-29 DIAGNOSIS — Z952 Presence of prosthetic heart valve: Secondary | ICD-10-CM

## 2014-03-29 LAB — POCT INR: INR: 3.5

## 2014-03-29 NOTE — Patient Instructions (Signed)
Continue 5 mg on Mondays, Wednesdays, and Fridays. All other days, 7.5mg . Eat a few extra servings of green veggies. Recheck in 4 weeks.   Anticoagulation Dose Instructions as of 03/29/2014      Rachel SmilesSun Mon Tue Wed Thu Fri Sat   New Dose 7.5 mg 5 mg 7.5 mg 5 mg 7.5 mg 5 mg 7.5 mg    Description        Continue 5 mg on Mondays, Wednesdays, and Fridays. All other days, 7.5mg . Eat a few extra servings of green veggies. Recheck in 4 weeks.

## 2014-04-22 ENCOUNTER — Ambulatory Visit (INDEPENDENT_AMBULATORY_CARE_PROVIDER_SITE_OTHER): Payer: Self-pay

## 2014-04-22 ENCOUNTER — Ambulatory Visit: Payer: Self-pay | Admitting: Family Medicine

## 2014-04-22 DIAGNOSIS — Z23 Encounter for immunization: Secondary | ICD-10-CM

## 2014-04-26 ENCOUNTER — Ambulatory Visit (INDEPENDENT_AMBULATORY_CARE_PROVIDER_SITE_OTHER): Payer: Self-pay | Admitting: Family

## 2014-04-26 DIAGNOSIS — Z954 Presence of other heart-valve replacement: Secondary | ICD-10-CM

## 2014-04-26 DIAGNOSIS — Z952 Presence of prosthetic heart valve: Secondary | ICD-10-CM

## 2014-04-26 LAB — POCT INR: INR: 3.7

## 2014-04-26 NOTE — Patient Instructions (Signed)
Take 1/2 tab only today then Continue 5 mg on Mondays, Wednesdays, and Fridays. All other days, 7.5mg . Eat a few extra servings of green veggies. Recheck in 4 weeks.   Anticoagulation Dose Instructions as of 04/26/2014      Glynis SmilesSun Mon Tue Wed Thu Fri Sat   New Dose 7.5 mg 5 mg 7.5 mg 5 mg 7.5 mg 5 mg 7.5 mg    Description        Take 1/2 tab only today then Continue 5 mg on Mondays, Wednesdays, and Fridays. All other days, 7.5mg . Eat a few extra servings of green veggies. Recheck in 4 weeks.

## 2014-05-23 ENCOUNTER — Ambulatory Visit: Payer: Self-pay

## 2014-05-28 ENCOUNTER — Encounter (HOSPITAL_COMMUNITY): Payer: Self-pay | Admitting: Emergency Medicine

## 2014-05-28 ENCOUNTER — Telehealth: Payer: Self-pay | Admitting: Internal Medicine

## 2014-05-28 ENCOUNTER — Emergency Department (HOSPITAL_COMMUNITY): Payer: Medicare Other

## 2014-05-28 ENCOUNTER — Inpatient Hospital Stay (HOSPITAL_COMMUNITY)
Admission: EM | Admit: 2014-05-28 | Discharge: 2014-06-12 | DRG: 308 | Disposition: A | Discharge status: Skilled Nursing Facility | Payer: Medicare Other | Attending: Cardiovascular Disease | Admitting: Cardiovascular Disease

## 2014-05-28 DIAGNOSIS — N19 Unspecified kidney failure: Secondary | ICD-10-CM | POA: Insufficient documentation

## 2014-05-28 DIAGNOSIS — I10 Essential (primary) hypertension: Secondary | ICD-10-CM | POA: Diagnosis present

## 2014-05-28 DIAGNOSIS — I248 Other forms of acute ischemic heart disease: Secondary | ICD-10-CM | POA: Diagnosis present

## 2014-05-28 DIAGNOSIS — I4891 Unspecified atrial fibrillation: Secondary | ICD-10-CM | POA: Diagnosis present

## 2014-05-28 DIAGNOSIS — J9601 Acute respiratory failure with hypoxia: Secondary | ICD-10-CM | POA: Diagnosis not present

## 2014-05-28 DIAGNOSIS — Z7901 Long term (current) use of anticoagulants: Secondary | ICD-10-CM

## 2014-05-28 DIAGNOSIS — R0602 Shortness of breath: Secondary | ICD-10-CM

## 2014-05-28 DIAGNOSIS — R791 Abnormal coagulation profile: Secondary | ICD-10-CM | POA: Diagnosis present

## 2014-05-28 DIAGNOSIS — R17 Unspecified jaundice: Secondary | ICD-10-CM

## 2014-05-28 DIAGNOSIS — E876 Hypokalemia: Secondary | ICD-10-CM | POA: Diagnosis not present

## 2014-05-28 DIAGNOSIS — I429 Cardiomyopathy, unspecified: Secondary | ICD-10-CM | POA: Diagnosis present

## 2014-05-28 DIAGNOSIS — R4182 Altered mental status, unspecified: Secondary | ICD-10-CM | POA: Insufficient documentation

## 2014-05-28 DIAGNOSIS — I059 Rheumatic mitral valve disease, unspecified: Secondary | ICD-10-CM | POA: Diagnosis present

## 2014-05-28 DIAGNOSIS — I5043 Acute on chronic combined systolic (congestive) and diastolic (congestive) heart failure: Secondary | ICD-10-CM | POA: Diagnosis present

## 2014-05-28 DIAGNOSIS — T45516A Underdosing of anticoagulants, initial encounter: Secondary | ICD-10-CM | POA: Diagnosis present

## 2014-05-28 DIAGNOSIS — Z952 Presence of prosthetic heart valve: Secondary | ICD-10-CM

## 2014-05-28 DIAGNOSIS — Z8249 Family history of ischemic heart disease and other diseases of the circulatory system: Secondary | ICD-10-CM

## 2014-05-28 DIAGNOSIS — I513 Intracardiac thrombosis, not elsewhere classified: Secondary | ICD-10-CM | POA: Diagnosis present

## 2014-05-28 DIAGNOSIS — N289 Disorder of kidney and ureter, unspecified: Secondary | ICD-10-CM | POA: Insufficient documentation

## 2014-05-28 DIAGNOSIS — I959 Hypotension, unspecified: Secondary | ICD-10-CM | POA: Diagnosis not present

## 2014-05-28 HISTORY — DX: Unspecified atrial fibrillation: I48.91

## 2014-05-28 HISTORY — DX: Cardiac murmur, unspecified: R01.1

## 2014-05-28 LAB — CBC
HEMATOCRIT: 44.8 % (ref 36.0–46.0)
HEMOGLOBIN: 15.2 g/dL — AB (ref 12.0–15.0)
MCH: 31.3 pg (ref 26.0–34.0)
MCHC: 33.9 g/dL (ref 30.0–36.0)
MCV: 92.2 fL (ref 78.0–100.0)
Platelets: 133 10*3/uL — ABNORMAL LOW (ref 150–400)
RBC: 4.86 MIL/uL (ref 3.87–5.11)
RDW: 13.9 % (ref 11.5–15.5)
WBC: 11.7 10*3/uL — ABNORMAL HIGH (ref 4.0–10.5)

## 2014-05-28 LAB — I-STAT TROPONIN, ED: Troponin i, poc: 0.17 ng/mL (ref 0.00–0.08)

## 2014-05-28 MED ORDER — SODIUM CHLORIDE 0.9 % IV SOLN
1000.0000 mL | INTRAVENOUS | Status: DC
Start: 2014-05-28 — End: 2014-06-12
  Administered 2014-05-28: 1000 mL via INTRAVENOUS

## 2014-05-28 MED ORDER — DILTIAZEM HCL 100 MG IV SOLR
5.0000 mg/h | INTRAVENOUS | Status: DC
  Administered 2014-05-28: 5 mg/h via INTRAVENOUS

## 2014-05-28 NOTE — ED Notes (Signed)
Pt c/o heart racing for 2 days. EMS reports HR was 180-200s on scene. EMS gave 10mg  cardiziem IV and started cardiziem 10mg /hr IV. Pt also c/o SOB/sweating. Pt a/o x 4 on arrival to ED.

## 2014-05-28 NOTE — Telephone Encounter (Signed)
Misty I set pt up for an appt before I sent you the message. She is scheduled for 05/29/14 at 10:45

## 2014-05-28 NOTE — Telephone Encounter (Signed)
Spoke to the pt.  She stated she does not feel well and wants to be seen.  Please have the pt talk to triage and/or make an appt.  Pt was placed on hold but scheduling did not pick up on line 101.

## 2014-05-28 NOTE — Telephone Encounter (Signed)
[facility] - Client TELEPHONE ADVICE RECORD [facility] Patient Name: Rachel FoersterJANICE Cantu Gender: Female DOB: 1950/12/16 Age: [age] Y 3 M 5 D Return Phone Number: [phone] (Primary) Address: [address] [place]Zip: [place] [id number] Client Rachel [facility]u [facility] - Client Client Site  [facility] Day Physician Rachel Cantu, Rachel Cantu Contact Type Call Call Type Triage / Clinical Relationship To Patient Self Appointment Disposition EMR Appointment Not Necessary Return Phone Number [phone] (Primary) Chief Complaint Chest Pain (non urgent symptoms) Initial Comment Caller States she shortness of breath that comes and goes. and is tired Info pasted into Epic No Nurse Assessment Guidelines Guideline Title Affirmed Question Affirmed Notes Nurse Date/Time (Eastern Time) Disp. Time Rachel Cantu(Eastern Time) Disposition Final User 05/28/2014 2:45:15 PM Attempt made - message left Cheryl FlashClarke, RN, Rebecca 05/28/2014 3:05:19 PM FINAL ATTEMPT MADE - message left Yes Kemper Durielarke, RN, Lurena Joinerebecca After Care Instructions Given Call Event Type User Date / Time Description

## 2014-05-28 NOTE — Telephone Encounter (Signed)
Rachel Cantu pt would like for you to give her a call. She said she is not feeling well and has some questions.

## 2014-05-28 NOTE — ED Provider Notes (Signed)
CSN: [id number]     Arrival date & time 05/28/14  2254 History  This chart was scribed for Linwood Dibbles, MD by Bronson Curb, ED Scribe. This patient was seen in room A04C/A04C and the patient's care was started at 11:11 PM.   Chief Complaint  Patient presents with  . Atrial Fibrillation    The history is provided by the patient. No language interpreter was used.     HPI Comments: Rachel Cantu is a 64 y.o. female, with history of HTN, who presents to the Emergency Department complaining of intertmittent palpitations for the past 2 days. Patient's husband states that she began to have worsening SOB and decided to call EMS. Patient states she sleeps with 4 pillows and was not able to sleep due to racing heart rate and SOB.She reports past surgical history of mitral valve replacement. Patient is currently on Coumadin. She denies chest pain, leg swelling, nausea, or vomiting.  Cardiologist: Dr. Tenny Craw at [facility].  Past Medical History  Diagnosis Date  . Hypertension   . History of chickenpox   . S/P MVR (mitral valve replacement) 1994  . Rheumatic fever     as a child   Past Surgical History  Procedure Laterality Date  . Mitral valve replacement  1994   Family History  Problem Relation Age of Onset  . Heart disease Father     premature age 25   History  Substance Use Topics  . Smoking status: Never Smoker   . Smokeless tobacco: Not on file  . Alcohol Use: No   OB History    No data available     Review of Systems  All other systems reviewed and are negative.   A complete 10 system review of systems was obtained and all systems are negative except as noted in the HPI and PMH.    Allergies  Sulfamethoxazole  Home Medications   Prior to Admission medications   Medication Sig Start Date End Date Taking? Authorizing Provider  valsartan-hydrochlorothiazide (DIOVAN-HCT) 160-25 MG per tablet Take 1 tablet by mouth daily.  04/03/14  Yes Historical Provider, MD  warfarin  (COUMADIN) 5 MG tablet TAKE 1 TO 2 TABLETS BY MOUTH DAILY Patient taking differently: Take 5-10 mg by mouth daily. Alternate taking 1 tablet one day then take 2 tablets the next day 02/22/14  Yes Madelin Headings, MD  COUMADIN 5 MG tablet TAKE 1 TO 2 TABLETS BY MOUTH EVERY DAY Patient not taking: Reported on 05/29/2014 02/26/14   Baker Pierini, FNP  olmesartan-hydrochlorothiazide (BENICAR HCT) 40-12.5 MG per tablet Take 1 tablet by mouth daily. Patient not taking: Reported on 05/29/2014 05/25/13   Madelin Headings, MD   Triage Vitals: BP 112/90 mmHg  Pulse 120  Temp(Src) 97.7 F (36.5 C) (Axillary)  Resp 19  SpO2 100%  Physical Exam  Constitutional: No distress.  HENT:  Head: Normocephalic and atraumatic.  Right Ear: External ear normal.  Left Ear: External ear normal.  Mouth/Throat: No oropharyngeal exudate.  Eyes: Conjunctivae are normal. Right eye exhibits no discharge. Left eye exhibits no discharge. No scleral icterus.  Neck: Neck supple. No tracheal deviation present.  Cardiovascular: Intact distal pulses.  An irregularly irregular rhythm present. Tachycardia present.   Pulmonary/Chest: Effort normal and breath sounds normal. No stridor. No respiratory distress. She has no wheezes. She has no rales.  Abdominal: Soft. Bowel sounds are normal. She exhibits no distension. There is no tenderness. There is no rebound and no guarding.  Musculoskeletal: She  exhibits edema (trace edema lower extrem). She exhibits no tenderness.  Neurological: She is alert. She has normal strength. No cranial nerve deficit (no facial droop, extraocular movements intact, no slurred speech) or sensory deficit. She exhibits normal muscle tone. She displays no seizure activity. Coordination normal.  Skin: Skin is warm and dry. No rash noted. She is not diaphoretic.  Psychiatric: She has a normal mood and affect.  Nursing note and vitals reviewed.   ED Course  Procedures (including critical care  time)  DIAGNOSTIC STUDIES: Oxygen Saturation is 100% on room air, normal by my interpretation.    COORDINATION OF CARE: At 2320 Discussed treatment plan with patient which includes labs. Patient agrees.   At [phone] Discussed with patient the lab results revealing abnormal kidney function (increased from 0.7 in 2013 to 2.52). Patient states she has not been informed of this during past visits. Also informed patient of elevated bilirubin of 2.2. Patient encouraged to f/u. Patient agrees.  Labs Review Labs Reviewed  CBC - Abnormal; Notable for the following:    WBC 11.7 (*)    Hemoglobin 15.2 (*)    Platelets 133 (*)    All other components within normal limits  COMPREHENSIVE METABOLIC PANEL - Abnormal; Notable for the following:    Glucose, Bld 154 (*)    BUN 54 (*)    Creatinine, Ser 2.52 (*)    Total Protein 5.3 (*)    Albumin 3.0 (*)    AST 60 (*)    ALT 48 (*)    Total Bilirubin 2.2 (*)    GFR calc non Af Amer 19 (*)    GFR calc Af Amer 22 (*)    All other components within normal limits  PROTIME-INR - Abnormal; Notable for the following:    Prothrombin Time 30.2 (*)    INR 2.86 (*)    All other components within normal limits  I-STAT TROPOININ, ED - Abnormal; Notable for the following:    Troponin i, poc 0.17 (*)    All other components within normal limits  APTT    Imaging Review Dg Chest Portable 1 View  05/28/2014   CLINICAL DATA:  Dyspnea.  Atrial fibrillation.  EXAM: PORTABLE CHEST - 1 VIEW  COMPARISON:  None  FINDINGS: There is marked cardiomegaly. There is prior sternotomy and valvuloplasty. Extensive vascular and interstitial congestion is present, without focal airspace consolidation. No large effusion is evident.  IMPRESSION: Marked cardiomegaly.  Probable mild congestive heart failure.   Electronically Signed   By: Ellery Plunkaniel R Mitchell M.D.   On: 05/28/2014 23:46     EKG Interpretation   Date/Time:  Tuesday May 28 2014 23:10:55 EST Ventricular Rate:   141 PR Interval:    QRS Duration: 84 QT Interval:  321 QTC Calculation: 492 R Axis:   102 Text Interpretation:  Atrial fibrillation Paired ventricular premature  complexes Right axis deviation Anteroseptal infarct, old Borderline T  abnormalities, inferior leads Atrial fibrillation is new since last  tracing, t wave changes are new since last tracing Confirmed by Bella Brummet   MD-J, Rigel Filsinger ([id number]) on 05/28/2014 11:25:40 PM      MDM   Final diagnoses:  Atrial fibrillation with rapid ventricular response  Renal insufficiency  Elevated bilirubin    New a fib with rvr.  Rate improving with cardizem bolus.  Will start an infusion.Marland Kitchen.  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  Most likely the contributing factors.  HR improved with treatment.  Now in the low  100s.  Pt denies any abdominal pain.  No abdominal ttp.  Elevated bilirubin and bun and cr will need outpatient follow up but no acute emergent issue with that.  Will consult with cardiology.  I personally performed the services described in this documentation, which was scribed in my presence.  The recorded information has been reviewed and is accurate.  r   Linwood Dibbles, MD 05/29/14 [phone]

## 2014-05-29 ENCOUNTER — Ambulatory Visit: Payer: Self-pay | Admitting: Internal Medicine

## 2014-05-29 ENCOUNTER — Inpatient Hospital Stay (HOSPITAL_COMMUNITY): Payer: Medicare Other

## 2014-05-29 ENCOUNTER — Encounter (HOSPITAL_COMMUNITY): Payer: Self-pay | Admitting: General Practice

## 2014-05-29 DIAGNOSIS — Z8249 Family history of ischemic heart disease and other diseases of the circulatory system: Secondary | ICD-10-CM | POA: Diagnosis not present

## 2014-05-29 DIAGNOSIS — I513 Intracardiac thrombosis, not elsewhere classified: Secondary | ICD-10-CM | POA: Diagnosis present

## 2014-05-29 DIAGNOSIS — I509 Heart failure, unspecified: Secondary | ICD-10-CM

## 2014-05-29 DIAGNOSIS — R791 Abnormal coagulation profile: Secondary | ICD-10-CM | POA: Diagnosis present

## 2014-05-29 DIAGNOSIS — N289 Disorder of kidney and ureter, unspecified: Secondary | ICD-10-CM

## 2014-05-29 DIAGNOSIS — Z7901 Long term (current) use of anticoagulants: Secondary | ICD-10-CM | POA: Diagnosis not present

## 2014-05-29 DIAGNOSIS — Z952 Presence of prosthetic heart valve: Secondary | ICD-10-CM | POA: Diagnosis not present

## 2014-05-29 DIAGNOSIS — I5043 Acute on chronic combined systolic (congestive) and diastolic (congestive) heart failure: Secondary | ICD-10-CM | POA: Diagnosis present

## 2014-05-29 DIAGNOSIS — I248 Other forms of acute ischemic heart disease: Secondary | ICD-10-CM | POA: Diagnosis present

## 2014-05-29 DIAGNOSIS — I4891 Unspecified atrial fibrillation: Secondary | ICD-10-CM

## 2014-05-29 DIAGNOSIS — I10 Essential (primary) hypertension: Secondary | ICD-10-CM | POA: Diagnosis present

## 2014-05-29 DIAGNOSIS — E876 Hypokalemia: Secondary | ICD-10-CM | POA: Diagnosis not present

## 2014-05-29 DIAGNOSIS — I959 Hypotension, unspecified: Secondary | ICD-10-CM | POA: Diagnosis not present

## 2014-05-29 DIAGNOSIS — I5031 Acute diastolic (congestive) heart failure: Secondary | ICD-10-CM

## 2014-05-29 DIAGNOSIS — R7989 Other specified abnormal findings of blood chemistry: Secondary | ICD-10-CM

## 2014-05-29 DIAGNOSIS — T45516A Underdosing of anticoagulants, initial encounter: Secondary | ICD-10-CM | POA: Diagnosis present

## 2014-05-29 DIAGNOSIS — J9601 Acute respiratory failure with hypoxia: Secondary | ICD-10-CM | POA: Diagnosis not present

## 2014-05-29 DIAGNOSIS — I429 Cardiomyopathy, unspecified: Secondary | ICD-10-CM | POA: Diagnosis present

## 2014-05-29 HISTORY — DX: Unspecified atrial fibrillation: I48.91

## 2014-05-29 LAB — COMPREHENSIVE METABOLIC PANEL
ALBUMIN: 3 g/dL — AB (ref 3.5–5.2)
ALT: 48 U/L — ABNORMAL HIGH (ref 0–35)
ANION GAP: 14 (ref 5–15)
AST: 60 U/L — AB (ref 0–37)
Alkaline Phosphatase: 67 U/L (ref 39–117)
BILIRUBIN TOTAL: 2.2 mg/dL — AB (ref 0.3–1.2)
BUN: 54 mg/dL — AB (ref 6–23)
CO2: 19 mmol/L (ref 19–32)
CREATININE: 2.52 mg/dL — AB (ref 0.50–1.10)
Calcium: 8.5 mg/dL (ref 8.4–10.5)
Chloride: 102 mmol/L (ref 96–112)
GFR calc Af Amer: 22 mL/min — ABNORMAL LOW (ref >90)
GFR, EST NON AFRICAN AMERICAN: 19 mL/min — AB (ref >90)
Glucose, Bld: 154 mg/dL — ABNORMAL HIGH (ref 70–99)
Potassium: 4.2 mmol/L (ref 3.5–5.1)
Sodium: 135 mmol/L (ref 135–145)
Total Protein: 5.3 g/dL — ABNORMAL LOW (ref 6.0–8.3)

## 2014-05-29 LAB — URINALYSIS, ROUTINE W REFLEX MICROSCOPIC
GLUCOSE, UA: NEGATIVE mg/dL
KETONES UR: 15 mg/dL — AB
Nitrite: NEGATIVE
PROTEIN: 100 mg/dL — AB
Specific Gravity, Urine: 1.022 (ref 1.005–1.030)
UROBILINOGEN UA: 1 mg/dL (ref 0.0–1.0)
pH: 5 (ref 5.0–8.0)

## 2014-05-29 LAB — TROPONIN I
TROPONIN I: 0.26 ng/mL — AB (ref <0.031)
Troponin I: 0.24 ng/mL — ABNORMAL HIGH (ref <0.031)
Troponin I: 0.18 ng/mL — ABNORMAL HIGH (ref <0.031)

## 2014-05-29 LAB — MRSA PCR SCREENING: MRSA by PCR: NEGATIVE

## 2014-05-29 LAB — PROTIME-INR
INR: 2.34 — AB (ref 0.00–1.49)
INR: 2.86 — AB (ref 0.00–1.49)
Prothrombin Time: 25.8 seconds — ABNORMAL HIGH (ref 11.6–15.2)
Prothrombin Time: 30.2 seconds — ABNORMAL HIGH (ref 11.6–15.2)

## 2014-05-29 LAB — URINE MICROSCOPIC-ADD ON

## 2014-05-29 LAB — APTT: APTT: 32 s (ref 24–37)

## 2014-05-29 LAB — TSH: TSH: 1.494 u[IU]/mL (ref 0.350–4.500)

## 2014-05-29 MED ORDER — ACETAMINOPHEN 325 MG PO TABS: 650.0000 mg | ORAL_TABLET | ORAL | Status: DC | PRN

## 2014-05-29 MED ORDER — DEXTROSE 5 % IV SOLN
5.0000 mg/h | INTRAVENOUS | Status: DC
  Administered 2014-05-29 – 2014-05-30 (×4): 15 mg/h via INTRAVENOUS
  Administered 2014-05-31: 10 mg/h via INTRAVENOUS
  Filled 2014-05-29: qty 100

## 2014-05-29 MED ORDER — SODIUM CHLORIDE 0.9 % IV SOLN
INTRAVENOUS | Status: DC
  Administered 2014-05-29: 22:00:00 via INTRAVENOUS

## 2014-05-29 MED ORDER — FUROSEMIDE 10 MG/ML IJ SOLN
20.0000 mg | Freq: Every day | INTRAMUSCULAR | Status: DC
  Administered 2014-05-29: 20 mg via INTRAVENOUS
  Filled 2014-05-29: qty 2

## 2014-05-29 MED ORDER — PNEUMOCOCCAL VAC POLYVALENT 25 MCG/0.5ML IJ INJ
0.5000 mL | INJECTION | INTRAMUSCULAR | Status: DC
  Filled 2014-05-29: qty 0.5

## 2014-05-29 MED ORDER — HEPARIN (PORCINE) IN NACL 100-0.45 UNIT/ML-% IJ SOLN
1050.0000 [IU]/h | INTRAMUSCULAR | Status: DC
  Administered 2014-05-29: 1100 [IU]/h via INTRAVENOUS
  Administered 2014-05-30 – 2014-06-02 (×4): 1050 [IU]/h via INTRAVENOUS
  Filled 2014-05-29 (×6): qty 250

## 2014-05-29 MED ORDER — SODIUM CHLORIDE 0.9 % IJ SOLN
3.0000 mL | Freq: Two times a day (BID) | INTRAMUSCULAR | Status: DC
  Administered 2014-05-29 – 2014-06-12 (×27): 3 mL via INTRAVENOUS

## 2014-05-29 MED ORDER — SODIUM CHLORIDE 0.9 % IV SOLN: 250.0000 mL | INTRAVENOUS | Status: DC | PRN

## 2014-05-29 MED ORDER — OFF THE BEAT BOOK
Freq: Once | Status: AC
  Administered 2014-05-29: 07:00:00
  Filled 2014-05-29: qty 1

## 2014-05-29 MED ORDER — SODIUM CHLORIDE 0.9 % IJ SOLN: 3.0000 mL | INTRAMUSCULAR | Status: DC | PRN

## 2014-05-29 NOTE — Anesthesia Preprocedure Evaluation (Addendum)
Anesthesia Evaluation  Patient identified by MRN, date of birth, ID band Patient awake    Reviewed: Allergy & Precautions, NPO status , Patient's Chart, lab work & pertinent test results, reviewed documented beta blocker date and time   Airway Mallampati: II   Neck ROM: Full    Dental  (+)    Pulmonary  breath sounds clear to auscultation        Cardiovascular hypertension, Pt. on medications +CHF Rhythm:Irregular  15/50, ECHJO EF 20-25%   Neuro/Psych    GI/Hepatic negative GI ROS, Neg liver ROS,   Endo/Other    Renal/GU      Musculoskeletal   Abdominal (+)  Abdomen: soft.    Peds  Hematology   Anesthesia Other Findings   Reproductive/Obstetrics                            Anesthesia Physical Anesthesia Plan  ASA: III  Anesthesia Plan: MAC   Post-op Pain Management:    Induction: Intravenous  Airway Management Planned:   Additional Equipment:   Intra-op Plan:   Post-operative Plan: Extubation in OR  Informed Consent: I have reviewed the patients History and Physical, chart, labs and discussed the procedure including the risks, benefits and alternatives for the proposed anesthesia with the patient or authorized representative who has indicated his/her understanding and acceptance.     Plan Discussed with:   Anesthesia Plan Comments:         Anesthesia Quick Evaluation

## 2014-05-29 NOTE — Progress Notes (Addendum)
ANTICOAGULATION CONSULT NOTE - Initial Consult  Pharmacy Consult for heparin Indication: AFib and mech MVR  Allergies  Allergen Reactions  . Sulfamethoxazole     REACTION: unspecified    Patient Measurements: Height: 5\' 4"  (162.6 cm) Weight: 168 lb 12.8 oz (76.567 kg) IBW/kg (Calculated) : 54.7 Heparin Dosing Weight: 70kg  Vital Signs: Temp: 97.5 F (36.4 C) (02/17 0806) Temp Source: Axillary (02/17 0806) BP: 102/77 mmHg (02/17 0806) Pulse Rate: 93 (02/17 0806)  Labs:  Recent Labs  05/28/14 2341 05/29/14 0444  HGB 15.2*  --   HCT 44.8  --   PLT 133*  --   APTT 32  --   LABPROT 30.2*  --   INR 2.86*  --   CREATININE 2.52*  --   TROPONINI  --  0.24*    Estimated Creatinine Clearance: 22.3 mL/min (by C-G formula based on Cr of 2.52).   Assessment: [age] YOF with history of rheumatic disease s/p mech MVR on chronic warfarin with goal INR of 2.5-3.5. She presented in AFib with RVR, and cards has some concern she has a thrombosis as she missed a few doses of warfarin this past week. Her INR this morning is therapeutic at 2.86 (last dose of warfarin 5mg  taken 2/16 PTA; home dose is alternating 5mg  and 10mg ).  Discussed with Dr. Delton SeeNelson and will check INR tonight and start heparin if it drops below therapeutic goal.  Hgb 15.2, plts 133- no bleeding noted.  Goal of Therapy:  Heparin level 0.3-0.7 units/ml INR 2.5-3.5 Monitor platelets by anticoagulation protocol: Yes   Plan:  -check INR tonight at 1800 -if INR is <2.5, will start heparin -daily CBC and INR ordered  Lauren D. Bajbus, PharmD, BCPS Clinical Pharmacist Pager: [phone] 05/29/2014 11:14 AM  Addendum:  INR today is 2.34 (<2.5) so will start IV heparin as discussed with Dr. Delton SeeNelson earlier today.  CBC is within normal limits. No bleeding noted.   Plan: Heparin drip at 1100 units/hr (no bolus due to elevated INR).  Heparin level 8 hours after drip initiated.  Daily heparin level and CBC while on  therapy.   Link SnufferJessica Chrisopher Pustejovsky, PharmD, BCPS Clinical Pharmacist [phone] 05/29/2014, 8:48 PM

## 2014-05-29 NOTE — Progress Notes (Addendum)
Patient Name: Rachel DumasJanice E Cantu Date of Encounter: 05/29/2014  Active Problems:   Atrial fibrillation with RVR   Length of Stay: 0  SUBJECTIVE  SOB  CURRENT MEDS . furosemide  20 mg Intravenous Daily  . [START ON 05/30/2014] pneumococcal 23 valent vaccine  0.5 mL Intramuscular Tomorrow-1000  . sodium chloride  3 mL Intravenous Q12H   . sodium chloride 1,000 mL (05/28/14 2350)  . diltiazem (CARDIZEM) infusion 15 mg/hr (05/29/14 0735)    OBJECTIVE  Filed Vitals:   05/29/14 0140 05/29/14 0251 05/29/14 0400 05/29/14 0806  BP: 104/71 120/77 121/59 102/77  Pulse: 108 95 107 93  Temp:   97.8 F (36.6 C) 97.5 F (36.4 C)  TempSrc:    Axillary  Resp: 17 19 25 20   Height:   5\' 4"  (1.626 m)   Weight:   168 lb 12.8 oz (76.567 kg)   SpO2: 97% 99% 98% 98%   No intake or output data in the 24 hours ending 05/29/14 1009 Filed Weights   05/28/14 2318 05/29/14 0400  Weight: 157 lb (71.215 kg) 168 lb 12.8 oz (76.567 kg)   PHYSICAL EXAM  General: Pleasant, NAD. Neuro: Alert and oriented X 3. Moves all extremities spontaneously. Psych: Normal affect. HEENT:  Normal  Neck: Supple without bruits or JVD. Lungs:  Resp regular and unlabored, CTA. Heart: RRR no s3, s4, or murmurs. Valve click Abdomen: Soft, non-tender, non-distended, BS + x 4.  Extremities: No clubbing, cyanosis, B/L LE edema. DP/PT/Radials 2+ and equal bilaterally.  Accessory Clinical Findings  CBC  Recent Labs  05/28/14 2341  WBC 11.7*  HGB 15.2*  HCT 44.8  MCV 92.2  PLT 133*   Basic Metabolic Panel  Recent Labs  05/28/14 2341  NA 135  K 4.2  CL 102  CO2 19  GLUCOSE 154*  BUN 54*  CREATININE 2.52*  CALCIUM 8.5   Liver Function Tests  Recent Labs  05/28/14 2341  AST 60*  ALT 48*  ALKPHOS 67  BILITOT 2.2*  PROT 5.3*  ALBUMIN 3.0*   Cardiac Enzymes  Recent Labs  05/29/14 0444  TROPONINI 0.24*     Recent Labs  05/29/14 0444  TSH 1.494    Radiology/Studies  Dg Chest  Portable 1 View  05/28/2014   CLINICAL DATA:  Dyspnea.  Atrial fibrillation.  EXAM: PORTABLE CHEST - 1 VIEW  COMPARISON:  None  FINDINGS: There is marked cardiomegaly. There is prior sternotomy and valvuloplasty. Extensive vascular and interstitial congestion is present, without focal airspace consolidation. No large effusion is evident.  IMPRESSION: Marked cardiomegaly.  Probable mild congestive heart failure.   Electronically Signed   By: Ellery Plunkaniel R Mitchell M.D.   On: 05/28/2014 23:46    TELE: A-fib with RVR    ASSESSMENT AND PLAN  64 y.o. female w/ PMHx significant for rheumatic mitral dz s/p mech MVR, HTN who [facility]2014 with complaints of 7 days of shortness of breath and palpitations --> found to be in heart failure with elevated troponin, afib with RVR. She has missed two doses of Coumadine the last week  1. A- fib with RVR is a new diagnosis but not unexpected given her history of mitral valve disease. TSH normal.  - Continue diltiazem gtt.  - Follow echo, the major concern would be mechanical valve thrombosis given missed coumadin doses, CHADS-VASc 3. - if she doesn't cardiovert by tomorrow we will schedule TEE/DCCV. TEE will also allow to better evaluate possible valve  thrombosis  2. Acute on chronic CHF - sec to a-fib with RVR She got lasix 20 mg iv x 1, doesn't appear to be significantly overloaded and has acute renal failure, we will hold lasix now  3. S/P mechanical MV - missed coumadin doses, we will wait for TTE and possible TEE tomorrow  4. Acute renal failure - normal in 2013. Perhaps is due to decreased renal gradient due to decompensated heart failure. Worsened by being on ARB. Hold ARB. Will evaluated with renal ultrasound, U/A and repeat Cr with diuresis.   Signed, Lars Masson MD, [facility] 05/29/2014

## 2014-05-29 NOTE — H&P (Signed)
History and Physical  Patient ID: Rachel Cantu MRN: [id number], SOB: 10-Dec-1950 64 y.o. Date of Encounter: 05/29/2014, 2:02 AM  Primary Physician: Lorretta HarpPANOSH,WANDA KOTVAN, MD Primary Cardiologist: Dr. Tenny Crawoss  Chief Complaint: SOB, papitlations  HPI: 64 y.o. female w/ PMHx significant for rheumatic mitral dz s/p mech MVR, HTN who presented to [facility] on 05/29/2014 with complaints of 7 days of shortness of breath and palpitations. She presents with her husband. She is a rather difficult historian to follow and answers questions repetitively and tangentially goes off. She reports seeing Dr. Tenny Crawoss annually but I cannot find those notes in the system (last note 2009). From what I gather, she had a rough fall and winter with frequent URIs. She also goes to coumadin clinic for INR checks which she seems to think are like going to the doctor. She also states that she has had an echo in the last 3 yrs but I also can't find that in the system.  She reports over the last several days, increasing shortness of breath and difficulty breathing. Noted LE swelling as well which she has not had before. No chest pain but has noted her heart beating fast and irregularly.   Denies any urinary symptoms. Never has been told she had kidney problems.   In the ER, was found to be afib RVR at rates in the 140s. Given dilt gtt and rates now 110s.   EKG revealed afib with RVR, septal qs, infero lateral TW inversion. CXR suggested fluid overload. Labs are significant for Cr of 2.5 and bilirubin 2.2, new since 2013 when last in our system.   Past Medical History  Diagnosis Date  . Hypertension   . History of chickenpox   . S/P MVR (mitral valve replacement) 1994  . Rheumatic fever     as a child     Surgical History:  Past Surgical History  Procedure Laterality Date  . Mitral valve replacement  1994     Home Meds: Prior to Admission medications   Medication Sig Start Date End Date Taking? Authorizing  Provider  valsartan-hydrochlorothiazide (DIOVAN-HCT) 160-25 MG per tablet Take 1 tablet by mouth daily.  04/03/14  Yes Historical Provider, MD  warfarin (COUMADIN) 5 MG tablet TAKE 1 TO 2 TABLETS BY MOUTH DAILY Patient taking differently: Take 5-10 mg by mouth daily. Alternate taking 1 tablet one day then take 2 tablets the next day 02/22/14  Yes Madelin HeadingsWanda K Panosh, MD  COUMADIN 5 MG tablet TAKE 1 TO 2 TABLETS BY MOUTH EVERY DAY Patient not taking: Reported on 05/29/2014 02/26/14   Baker PieriniPadonda B Campbell, FNP  olmesartan-hydrochlorothiazide (BENICAR HCT) 40-12.5 MG per tablet Take 1 tablet by mouth daily. Patient not taking: Reported on 05/29/2014 05/25/13   Madelin HeadingsWanda K Panosh, MD    Allergies:  Allergies  Allergen Reactions  . Sulfamethoxazole     REACTION: unspecified    History   Social History  . Marital Status: Divorced    Spouse Name: N/A  . Number of Children: N/A  . Years of Education: N/A   Occupational History  . Not on file.   Social History Main Topics  . Smoking status: Never Smoker   . Smokeless tobacco: Not on file  . Alcohol Use: No  . Drug Use: Not on file  . Sexual Activity: Not on file   Other Topics Concern  . Not on file   Social History Narrative   hh of 2    G1P1   Divorced  Family History  Problem Relation Age of Onset  . Heart disease Father     premature age 2    Review of Systems General: negative for chills, fever, night sweats or weight changes.  Cardiovascular: see HPI Dermatological: negative for rash Respiratory: negative for cough or wheezing Urologic: negative for hematuria Abdominal: negative for nausea, vomiting, diarrhea, bright red blood per rectum, melena, or hematemesis Neurologic: negative for visual changes, syncope, or dizziness All other systems reviewed and are otherwise negative except as noted above.  Labs:   Lab Results  Component Value Date   WBC 11.7* 05/28/2014   HGB 15.2* 05/28/2014   HCT 44.8 05/28/2014   MCV  92.2 05/28/2014   PLT 133* 05/28/2014    Recent Labs Lab 05/28/14 2341  NA 135  K 4.2  CL 102  CO2 19  BUN 54*  CREATININE 2.52*  CALCIUM 8.5  PROT 5.3*  BILITOT 2.2*  ALKPHOS 67  ALT 48*  AST 60*  GLUCOSE 154*   No results for input(s): CKTOTAL, CKMB, TROPONINI in the last 72 hours. Lab Results  Component Value Date   CHOL 244* 11/23/2011   HDL 54.40 11/23/2011   LDLCALC 115* 06/13/2007   TRIG 167.0* 11/23/2011   No results found for: DDIMER  Radiology/Studies:  Dg Chest Portable 1 View  05/28/2014   CLINICAL DATA:  Dyspnea.  Atrial fibrillation.  EXAM: PORTABLE CHEST - 1 VIEW  COMPARISON:  None  FINDINGS: There is marked cardiomegaly. There is prior sternotomy and valvuloplasty. Extensive vascular and interstitial congestion is present, without focal airspace consolidation. No large effusion is evident.  IMPRESSION: Marked cardiomegaly.  Probable mild congestive heart failure.   Electronically Signed   By: Ellery Plunk M.D.   On: 05/28/2014 23:46     EKG: see HPI  Physical Exam: Blood pressure 104/71, pulse 108, temperature 97.7 F (36.5 C), temperature source Axillary, resp. rate 17, height  (1.626 m), weight 71.215 kg (157 lb), SpO2 97 %. General: , in no acute distress. Head: Normocephalic, atraumatic, eyes protuberant, nares are without discharge Neck: Supple. Negative for carotid bruits. JV appears to be at least 15 cm Lungs: crackles at the base. Heart: irreg, irreg, not very crisp heart sounds Abdomen: Soft, non-tender, non-distended with normoactive bowel sounds. No rebound/guarding. No obvious abdominal masses. Msk:  Strength and tone appear normal for age. Extremities: +2 pitting edema. No clubbing or cyanosis. Distal pedal pulses are 2+ and equal bilaterally. Neuro: Alert and oriented X 3. Moves all extremities spontaneously. Psych: odd affect, difficult to follow historian but husband is not perturbed by her answers and does not think she is  having cognitive problems   Problem List 1. Afib with RVR 2. Congestive heart failure, EF unknown 3. S/p mechanical mitral valve (unclear if medtronic tilting disc vs. SJM bileaflets, notes have both listed. Pt;s husband to bring in card) 4. Renal dysfunction, acutely not known 5. Elevated bilirubin 6. HTN 7. Elevated troponin  ASSESSMENT AND PLAN:  64 y.o. female w/ PMHx significant for rheumatic mitral dz s/p mech MVR, HTN who presented to [facility] on 05/29/2014 with complaints of 7 days of shortness of breath and palpitations --> found to be in heart failure with elevated troponin, afib with RVR.  First, the afib with RVR is a new diagnosis but not unexpected given her history of mitral valve disease. Further workup with echo, check TSH. Continue diltiazem gtt. Increased risk of stroke given afib and mech MVR.  Also appears  to be in acute decompensated heart failure with both R and L signs and symptoms. Echo to evaluate ejection fraction which will help dictate recommended therapies. However, limited ability to treat currently with renal dysfunction and the need for dilt. Will attempt gentle diureses.   Noted elevated troponin, likely supply demand mismatch due to afib with RVR. She is refusing aspirin at this time for unclear reasons (told she can't take effervescents).  Concerning that on exam, her valve sounds are not very crisp but may be diminished due to afib and RVR. Echo to evaluate. On coumadin and therapeutic.  Finally, elevated CKD is of unknown chronicity but it was normal in 2013. Perhaps is due to decreased renal gradient due to decompensated heart failure. Worsened by being on ARB. Hold ARB. Will evaluated with renal ultrasound, U/A and repeat Cr with diuresis.  Full code On coumadin, holding for now (either restart or cover with heparin due to need for procedure)   Signed, Kemuel Buchmann C. MD 05/29/2014, 2:02 AM

## 2014-05-29 NOTE — Progress Notes (Signed)
  Echocardiogram 2D Echocardiogram has been performed.  Aris EvertsRix, Keymon Mcelroy A 05/29/2014, 11:38 AM

## 2014-05-29 NOTE — Telephone Encounter (Signed)
Pt scheduled for OV on 05/29/14

## 2014-05-29 NOTE — ED Notes (Signed)
i-stat troponin result reported to Dr. Lynelle DoctorKnapp

## 2014-05-30 ENCOUNTER — Inpatient Hospital Stay (HOSPITAL_COMMUNITY): Payer: Medicare Other | Admitting: Anesthesiology

## 2014-05-30 ENCOUNTER — Encounter (HOSPITAL_COMMUNITY)
Admission: EM | Disposition: A | Discharge status: Skilled Nursing Facility | Payer: Self-pay | Source: Home / Self Care | Attending: Cardiovascular Disease

## 2014-05-30 ENCOUNTER — Encounter (HOSPITAL_COMMUNITY): Payer: Self-pay | Admitting: *Deleted

## 2014-05-30 DIAGNOSIS — I5043 Acute on chronic combined systolic (congestive) and diastolic (congestive) heart failure: Secondary | ICD-10-CM | POA: Diagnosis present

## 2014-05-30 DIAGNOSIS — N19 Unspecified kidney failure: Secondary | ICD-10-CM | POA: Insufficient documentation

## 2014-05-30 DIAGNOSIS — I340 Nonrheumatic mitral (valve) insufficiency: Secondary | ICD-10-CM

## 2014-05-30 DIAGNOSIS — I5021 Acute systolic (congestive) heart failure: Secondary | ICD-10-CM

## 2014-05-30 HISTORY — PX: TEE WITHOUT CARDIOVERSION: SHX5443

## 2014-05-30 LAB — BASIC METABOLIC PANEL
Anion gap: 10 (ref 5–15)
BUN: 41 mg/dL — AB (ref 6–23)
CHLORIDE: 101 mmol/L (ref 96–112)
CO2: 22 mmol/L (ref 19–32)
Calcium: 8.3 mg/dL — ABNORMAL LOW (ref 8.4–10.5)
Creatinine, Ser: 1.1 mg/dL (ref 0.50–1.10)
GFR calc Af Amer: 60 mL/min — ABNORMAL LOW (ref >90)
GFR calc non Af Amer: 52 mL/min — ABNORMAL LOW (ref >90)
Glucose, Bld: 129 mg/dL — ABNORMAL HIGH (ref 70–99)
POTASSIUM: 3 mmol/L — AB (ref 3.5–5.1)
Sodium: 133 mmol/L — ABNORMAL LOW (ref 135–145)

## 2014-05-30 LAB — CBC
HCT: 41 % (ref 36.0–46.0)
Hemoglobin: 13.8 g/dL (ref 12.0–15.0)
MCH: 31.4 pg (ref 26.0–34.0)
MCHC: 33.7 g/dL (ref 30.0–36.0)
MCV: 93.4 fL (ref 78.0–100.0)
Platelets: 109 10*3/uL — ABNORMAL LOW (ref 150–400)
RBC: 4.39 MIL/uL (ref 3.87–5.11)
RDW: 14 % (ref 11.5–15.5)
WBC: 8.3 10*3/uL (ref 4.0–10.5)

## 2014-05-30 LAB — PROTIME-INR
INR: 1.96 — AB (ref 0.00–1.49)
Prothrombin Time: 22.5 seconds — ABNORMAL HIGH (ref 11.6–15.2)

## 2014-05-30 LAB — HEPARIN LEVEL (UNFRACTIONATED)
Heparin Unfractionated: 0.4 IU/mL (ref 0.30–0.70)
Heparin Unfractionated: 0.66 IU/mL (ref 0.30–0.70)
Heparin Unfractionated: 0.49 IU/mL (ref 0.30–0.70)

## 2014-05-30 SURGERY — ECHOCARDIOGRAM, TRANSESOPHAGEAL
Anesthesia: Monitor Anesthesia Care

## 2014-05-30 MED ORDER — PROPOFOL INFUSION 10 MG/ML OPTIME
INTRAVENOUS | Status: DC | PRN
  Administered 2014-05-30: 80 ug/kg/min via INTRAVENOUS

## 2014-05-30 MED ORDER — FENTANYL CITRATE 0.05 MG/ML IJ SOLN: 25.0000 ug | INTRAMUSCULAR | Status: DC | PRN

## 2014-05-30 MED ORDER — PROMETHAZINE HCL 25 MG/ML IJ SOLN: 6.2500 mg | INTRAMUSCULAR | Status: DC | PRN

## 2014-05-30 MED ORDER — PROPOFOL 10 MG/ML IV BOLUS
INTRAVENOUS | Status: DC | PRN
  Administered 2014-05-30: 70 mg via INTRAVENOUS

## 2014-05-30 MED ORDER — MEPERIDINE HCL 25 MG/ML IJ SOLN: 6.2500 mg | INTRAMUSCULAR | Status: DC | PRN

## 2014-05-30 MED ORDER — SODIUM CHLORIDE 0.9 % IV SOLN: 250.0000 mL | INTRAVENOUS | Status: DC

## 2014-05-30 MED ORDER — WARFARIN - PHARMACIST DOSING INPATIENT
Freq: Every day | Status: DC
  Administered 2014-06-01: 18:00:00
  Administered 2014-06-03: 1
  Administered 2014-06-04 – 2014-06-10 (×5)

## 2014-05-30 MED ORDER — WARFARIN SODIUM 10 MG PO TABS
10.0000 mg | ORAL_TABLET | Freq: Once | ORAL | Status: AC
Start: 2014-05-30 — End: 2014-05-30
  Administered 2014-05-30: 10 mg via ORAL
  Filled 2014-05-30: qty 1

## 2014-05-30 MED ORDER — LACTATED RINGERS IV SOLN
INTRAVENOUS | Status: DC | PRN
  Administered 2014-05-30: 10:00:00 via INTRAVENOUS

## 2014-05-30 MED ORDER — SODIUM CHLORIDE 0.9 % IJ SOLN: 3.0000 mL | INTRAMUSCULAR | Status: DC | PRN

## 2014-05-30 MED ORDER — PHENYLEPHRINE HCL 10 MG/ML IJ SOLN
INTRAMUSCULAR | Status: DC | PRN
  Administered 2014-05-30: 80 ug via INTRAVENOUS
  Administered 2014-05-30: 120 ug via INTRAVENOUS
  Administered 2014-05-30: 80 ug via INTRAVENOUS
  Administered 2014-05-30: 160 ug via INTRAVENOUS
  Administered 2014-05-30: 200 ug via INTRAVENOUS

## 2014-05-30 MED ORDER — LIDOCAINE HCL (CARDIAC) 20 MG/ML IV SOLN
INTRAVENOUS | Status: DC | PRN
  Administered 2014-05-30: 60 mg via INTRAVENOUS

## 2014-05-30 MED ORDER — SODIUM CHLORIDE 0.9 % IJ SOLN: 3.0000 mL | Freq: Two times a day (BID) | INTRAMUSCULAR | Status: DC

## 2014-05-30 MED ORDER — FUROSEMIDE 20 MG PO TABS
20.0000 mg | ORAL_TABLET | Freq: Every day | ORAL | Status: DC
  Administered 2014-05-30 – 2014-06-02 (×4): 20 mg via ORAL
  Filled 2014-05-30 (×4): qty 1

## 2014-05-30 NOTE — Transfer of Care (Signed)
Immediate Anesthesia Transfer of Care Note  Patient: Rachel Cantu  Procedure(s) Performed: Procedure(s): TRANSESOPHAGEAL ECHOCARDIOGRAM (TEE) (N/A)  Patient Location: Endoscopy Unit  Anesthesia Type:MAC  Level of Consciousness: awake, alert  and oriented  Airway & Oxygen Therapy: Patient Spontanous Breathing and Patient connected to nasal cannula oxygen  Post-op Assessment: Report given to RN, Post -op Vital signs reviewed and stable and Patient moving all extremities  Post vital signs: Reviewed and stable  Last Vitals:  Filed Vitals:   05/30/14 1137  BP: 110/85  Pulse: 52  Temp: 36.4 C  Resp: 18    Complications: No apparent anesthesia complications

## 2014-05-30 NOTE — Progress Notes (Signed)
Patient Name: Rachel DumasJanice E Cantu Date of Encounter: 05/30/2014  Active Problems:   Atrial fibrillation with RVR   S/P MVR (mitral valve replacement)   Acute diastolic CHF (congestive heart failure), NYHA class 3   Chronic anticoagulation   Atrial fibrillation with rapid ventricular response   Renal insufficiency   Length of Stay: 1  SUBJECTIVE  She continues to feel SOB, no chest pain.  CURRENT MEDS . [MAR Hold] furosemide  20 mg Intravenous Daily  . [MAR Hold] pneumococcal 23 valent vaccine  0.5 mL Intramuscular Tomorrow-1000  . [MAR Hold] sodium chloride  3 mL Intravenous Q12H  . [MAR Hold] sodium chloride  3 mL Intravenous Q12H   . sodium chloride 1,000 mL (05/28/14 2350)  . sodium chloride 20 mL/hr at 05/29/14 2158  . sodium chloride    . [MAR Hold] diltiazem (CARDIZEM) infusion 15 mg/hr (05/29/14 2332)  . heparin 1,100 Units/hr (05/29/14 2215)   OBJECTIVE  Filed Vitals:   05/30/14 0924 05/30/14 1046 05/30/14 1050 05/30/14 1100  BP: 147/92 118/54 100/57 102/77  Pulse: 78 28 35 27  Temp: 98 F (36.7 C)     TempSrc: Oral     Resp: 21 23 23 21   Height:      Weight:      SpO2:  99% 100% 100%    Intake/Output Summary (Last 24 hours) at 05/30/14 1103 Last data filed at 05/30/14 0818  Gross per 24 hour  Intake    780 ml  Output    350 ml  Net    430 ml   Filed Weights   05/28/14 2318 05/29/14 0400 05/30/14 0400  Weight: 157 lb (71.215 kg) 168 lb 12.8 oz (76.567 kg) 169 lb (76.658 kg)    PHYSICAL EXAM  General: Pleasant, NAD. Neuro: Alert and oriented X 3. Moves all extremities spontaneously. Psych: Normal affect. HEENT:  Normal  Neck: Supple without bruits or JVD. Lungs:  Resp regular and unlabored, CTA. Heart: RRR no s3, s4, or murmurs. Abdomen: Soft, non-tender, non-distended, BS + x 4.  Extremities: No clubbing, cyanosis or edema. DP/PT/Radials 2+ and equal bilaterally.  Accessory Clinical Findings  CBC  Recent Labs  05/28/14 2341  05/30/14 0451  WBC 11.7* 8.3  HGB 15.2* 13.8  HCT 44.8 41.0  MCV 92.2 93.4  PLT 133* 109*   Basic Metabolic Panel  Recent Labs  05/28/14 2341 05/30/14 0451  NA 135 133*  K 4.2 3.0*  CL 102 101  CO2 19 22  GLUCOSE 154* 129*  BUN 54* 41*  CREATININE 2.52* 1.10  CALCIUM 8.5 8.3*   Liver Function Tests  Recent Labs  05/28/14 2341  AST 60*  ALT 48*  ALKPHOS 67  BILITOT 2.2*  PROT 5.3*  ALBUMIN 3.0*    Recent Labs  05/29/14 0444 05/29/14 1140 05/29/14 1332  TROPONINI 0.24* 0.26* 0.18*    Recent Labs  05/29/14 0444  TSH 1.494    Radiology/Studies  [place] Renal  05/29/2014   CLINICAL DATA:  Renal failure.  Hypertension   IMPRESSION: 1. Normal appearance of the kidneys. No explanation for patient's renal failure.   Electronically Signed   By: Signa Kellaylor  Stroud M.D.   On: 05/29/2014 10:45   Dg Chest Portable 1 View  05/28/2014   CLINICAL DATA:  Dyspnea.  Atrial fibrillation.  IMPRESSION: Marked cardiomegaly.  Probable mild congestive heart failure.   Electronically Signed   By: Ellery Plunkaniel R Mitchell M.D.   On: 05/28/2014 23:46    TELE: a-fib, rate  controlled  TTE: 05/29/2014 Study Conclusions  - Left ventricle: The cavity size was normal. D-shaped septum consistent with right ventricular pressure/ volume overload. There was mild focal basal hypertrophy of the septum. Systolic function was severely reduced. The estimated ejection fraction was in the range of 20% to 25%. Diffuse hypokinesis. - Ventricular septum: Septal motion showed paradox. - Aortic valve: There was mild regurgitation. - Mitral valve: A mechanical prosthesis was present and functioning normally. The prosthesis had a normal range of motion. The sewing ring appeared normal, had no rocking motion, and showed no evidence of dehiscence. - Left atrium: The atrium was moderately dilated. - Right ventricle: The cavity size was moderately dilated. Wall thickness was normal. Systolic  function was moderately reduced. - Right atrium: The atrium was moderately dilated. - Tricuspid valve: There was moderate regurgitation. - Pulmonary arteries: Systolic pressure was moderately increased. PA peak pressure: 44 mm Hg (S).  Impressions:  - When compared to prior echocardiogram, 2009, EF is now reduced. (Previously 60%)    ASSESSMENT AND PLAN  64 y.o. female w/ PMHx significant for rheumatic mitral dz s/p mech MVR, HTN who presented to [facility] on 05/29/2014 with complaints of 7 days of shortness of breath and palpitations --> found to be in heart failure with elevated troponin, afib with RVR. She has missed two doses of Coumadine the last week  1. A- fib with RVR is a new diagnosis but not unexpected given her history of mitral valve disease. TSH normal.  - TEE done today showed large thrombus noted in LAA; DCCV was not perfomed - switch cardizem drip to PO cardizem - We will continue heparin--> with INR target 2.5-3.5, also add aspirin 81 mg po daily  2. Acute on chronic CHF - sec to a-fib with RVR - we will start lasix 20 mg po daily, Crea is improving  3. S/P mechanical MV - missed coumadin doses, TTE shows normally functioning mitral valve prosthesis with normal single disc excursion, minimally elevated transmitral velocities   4. Elevated troponin - demand ischemia sec to a-fib with RVR, now downtrending, on heparin drip  5. Biventricular failure - new LVEF 20-25%, 60% in 2009, moderately reduced RV function - possibly sec to a-fib with RVR, we will control HR fpr now, hold ACEI as acute renal failure  6. Acute renal failure - normal in 2013. Perhaps is due to decreased renal gradient due to decompensated heart failure. Held ARB. Improved today, we will follow.   Signed, Lars Masson MD, [facility] 05/30/2014

## 2014-05-30 NOTE — Progress Notes (Signed)
UR Completed Avyonna Wagoner Graves-Bigelow, RN,BSN [phone]  

## 2014-05-30 NOTE — Progress Notes (Signed)
ANTICOAGULATION CONSULT NOTE   Pharmacy Consult for Heparin Indication: Afib/MVR, LAA thrombus  Allergies  Allergen Reactions  . Sulfamethoxazole     REACTION: unspecified    Patient Measurements: Height: 5\' 4"  (162.6 cm) Weight: 169 lb (76.658 kg) IBW/kg (Calculated) : 54.7  Vital Signs: Temp: 97.5 F (36.4 C) (02/18 2047) Temp Source: Oral (02/18 2047) BP: 117/79 mmHg (02/18 2047) Pulse Rate: 85 (02/18 2047)  Labs:  Recent Labs  05/28/14 2341 05/29/14 0444 05/29/14 1140 05/29/14 1332 05/29/14 1931 05/30/14 0451 05/30/14 1225 05/30/14 2100  HGB 15.2*  --   --   --   --  13.8  --   --   HCT 44.8  --   --   --   --  41.0  --   --   PLT 133*  --   --   --   --  109*  --   --   APTT 32  --   --   --   --   --   --   --   LABPROT 30.2*  --   --   --  25.8* 22.5*  --   --   INR 2.86*  --   --   --  2.34* 1.96*  --   --   HEPARINUNFRC  --   --   --   --   --  0.40 0.66 0.49  CREATININE 2.52*  --   --   --   --  1.10  --   --   TROPONINI  --  0.24* 0.26* 0.18*  --   --   --   --     Estimated Creatinine Clearance: 51.1 mL/min (by C-G formula based on Cr of 1.1).  Assessment: [age] YOF with mech MVR on warf PTA presenting with AFib/RVR. TEE today showed large thrombus in LAA, DCCV was not done and coumadin was restarted tonight.  Heparin is at 1050 units/hr and heparin level remains at goal (HL= 0.49).  Goal of Therapy:  INR goal 2.5-3.5 Heparin level 0.3-0.7 units/ml Monitor platelets by anticoagulation protocol: Yes   Plan:  -No heparin changes needed -Daily heparin level and CBC  Harland GermanAndrew Aysen Shieh, Pharm D 05/30/2014 10:06 PM

## 2014-05-30 NOTE — Interval H&P Note (Signed)
History and Physical Interval Note:  05/30/2014 10:05 AM  Rachel Cantu  has presented today for surgery, with the diagnosis of afib  The various methods of treatment have been discussed with the patient and family. After consideration of risks, benefits and other options for treatment, the patient has consented to  Procedure(s): TRANSESOPHAGEAL ECHOCARDIOGRAM (TEE) (N/A) CARDIOVERSION (N/A) as a surgical intervention .  The patient's history has been reviewed, patient examined, no change in status, stable for surgery.  I have reviewed the patient's chart and labs.  Questions were answered to the patient's satisfaction.     Olga MillersBrian Hammad Finkler

## 2014-05-30 NOTE — Progress Notes (Signed)
ANTICOAGULATION CONSULT NOTE - Follow Up Consult  Pharmacy Consult for Heparin  Indication: Afib/MVR  Allergies  Allergen Reactions  . Sulfamethoxazole     REACTION: unspecified    Patient Measurements: Height: 5\' 4"  (162.6 cm) Weight: 169 lb (76.658 kg) IBW/kg (Calculated) : 54.7  Vital Signs: Temp: 97.6 F (36.4 C) (02/18 0400) BP: 121/94 mmHg (02/18 0400) Pulse Rate: 54 (02/18 0400)  Labs:  Recent Labs  05/28/14 2341 05/29/14 0444 05/29/14 1140 05/29/14 1332 05/29/14 1931 05/30/14 0451  HGB 15.2*  --   --   --   --  13.8  HCT 44.8  --   --   --   --  41.0  PLT 133*  --   --   --   --  PENDING  APTT 32  --   --   --   --   --   LABPROT 30.2*  --   --   --  25.8* 22.5*  INR 2.86*  --   --   --  2.34* 1.96*  HEPARINUNFRC  --   --   --   --   --  0.40  CREATININE 2.52*  --   --   --   --   --   TROPONINI  --  0.24* 0.26* 0.18*  --   --     Estimated Creatinine Clearance: 22.3 mL/min (by C-G formula based on Cr of 2.52).  Assessment: Therapeutic heparin level x 1  Goal of Therapy:  Heparin level 0.3-0.7 units/ml Monitor platelets by anticoagulation protocol: Yes   Plan:  -Continue heparin at 1100 units/hr -1200 HL -Daily CBC/HL -Monitor for bleeding  Abran DukeLedford, Samarra Ridgely 05/30/2014,6:47 AM

## 2014-05-30 NOTE — H&P (View-Only) (Signed)
  Patient Name: Rachel Cantu Date of Encounter: 05/29/2014  Active Problems:   Atrial fibrillation with RVR   Length of Stay: 0  SUBJECTIVE  SOB  CURRENT MEDS . furosemide  20 mg Intravenous Daily  . [START ON 05/30/2014] pneumococcal 23 valent vaccine  0.5 mL Intramuscular Tomorrow-1000  . sodium chloride  3 mL Intravenous Q12H   . sodium chloride 1,000 mL (05/28/14 2350)  . diltiazem (CARDIZEM) infusion 15 mg/hr (05/29/14 0735)    OBJECTIVE  Filed Vitals:   05/29/14 0140 05/29/14 0251 05/29/14 0400 05/29/14 0806  BP: 104/71 120/77 121/59 102/77  Pulse: 108 95 107 93  Temp:   97.8 F (36.6 C) 97.5 F (36.4 C)  TempSrc:    Axillary  Resp: 17 19 25 20  Height:   5' 4" (1.626 m)   Weight:   168 lb 12.8 oz (76.567 kg)   SpO2: 97% 99% 98% 98%   No intake or output data in the 24 hours ending 05/29/14 1009 Filed Weights   05/28/14 2318 05/29/14 0400  Weight: 157 lb (71.215 kg) 168 lb 12.8 oz (76.567 kg)   PHYSICAL EXAM  General: Pleasant, NAD. Neuro: Alert and oriented X 3. Moves all extremities spontaneously. Psych: Normal affect. HEENT:  Normal  Neck: Supple without bruits or JVD. Lungs:  Resp regular and unlabored, CTA. Heart: RRR no s3, s4, or murmurs. Valve click Abdomen: Soft, non-tender, non-distended, BS + x 4.  Extremities: No clubbing, cyanosis, B/L LE edema. DP/PT/Radials 2+ and equal bilaterally.  Accessory Clinical Findings  CBC  Recent Labs  05/28/14 2341  WBC 11.7*  HGB 15.2*  HCT 44.8  MCV 92.2  PLT 133*   Basic Metabolic Panel  Recent Labs  05/28/14 2341  NA 135  K 4.2  CL 102  CO2 19  GLUCOSE 154*  BUN 54*  CREATININE 2.52*  CALCIUM 8.5   Liver Function Tests  Recent Labs  05/28/14 2341  AST 60*  ALT 48*  ALKPHOS 67  BILITOT 2.2*  PROT 5.3*  ALBUMIN 3.0*   Cardiac Enzymes  Recent Labs  05/29/14 0444  TROPONINI 0.24*     Recent Labs  05/29/14 0444  TSH 1.494    Radiology/Studies  Dg Chest  Portable 1 View  05/28/2014   CLINICAL DATA:  Dyspnea.  Atrial fibrillation.  EXAM: PORTABLE CHEST - 1 VIEW  COMPARISON:  None  FINDINGS: There is marked cardiomegaly. There is prior sternotomy and valvuloplasty. Extensive vascular and interstitial congestion is present, without focal airspace consolidation. No large effusion is evident.  IMPRESSION: Marked cardiomegaly.  Probable mild congestive heart failure.   Electronically Signed   By: Daniel R Mitchell M.D.   On: 05/28/2014 23:46    TELE: A-fib with RVR    ASSESSMENT AND PLAN  63 y.o. female w/ PMHx significant for rheumatic mitral dz s/p mech MVR, HTN who presented to [facility] on 05/29/2014 with complaints of 7 days of shortness of breath and palpitations --> found to be in heart failure with elevated troponin, afib with RVR. She has missed two doses of Coumadine the last week  1. A- fib with RVR is a new diagnosis but not unexpected given her history of mitral valve disease. TSH normal.  - Continue diltiazem gtt.  - Follow echo, the major concern would be mechanical valve thrombosis given missed coumadin doses, CHADS-VASc 3. - if she doesn't cardiovert by tomorrow we will schedule TEE/DCCV. TEE will also allow to better evaluate possible valve   thrombosis  2. Acute on chronic CHF - sec to a-fib with RVR She got lasix 20 mg iv x 1, doesn't appear to be significantly overloaded and has acute renal failure, we will hold lasix now  3. S/P mechanical MV - missed coumadin doses, we will wait for TTE and possible TEE tomorrow  4. Acute renal failure - normal in 2013. Perhaps is due to decreased renal gradient due to decompensated heart failure. Worsened by being on ARB. Hold ARB. Will evaluated with renal ultrasound, U/A and repeat Cr with diuresis.   Signed, Cong Hightower H MD, FACC 05/29/2014   

## 2014-05-30 NOTE — Anesthesia Postprocedure Evaluation (Signed)
  Anesthesia Post-op Note  Patient: Rachel Cantu  Procedure(s) Performed: Procedure(s): TRANSESOPHAGEAL ECHOCARDIOGRAM (TEE) (N/A) CARDIOVERSION (N/A)  Patient Location: PACU and Endoscopy Unit  Anesthesia Type:MAC  Level of Consciousness: awake  Airway and Oxygen Therapy: Patient Spontanous Breathing and Patient connected to nasal cannula oxygen  Post-op Pain: none  Post-op Assessment: Post-op Vital signs reviewed, Patient's Cardiovascular Status Stable, Respiratory Function Stable and Patent Airway  Post-op Vital Signs: Reviewed and stable  Last Vitals:  Filed Vitals:   05/30/14 1046  BP: 118/54  Pulse: 28  Temp:   Resp: 23    Complications: No apparent anesthesia complications

## 2014-05-30 NOTE — Progress Notes (Addendum)
ANTICOAGULATION CONSULT NOTE   Pharmacy Consult for Heparin/Warfarin Indication: Afib/MVR, LAA thrombus  Allergies  Allergen Reactions  . Sulfamethoxazole     REACTION: unspecified    Patient Measurements: Height: 5\' 4"  (162.6 cm) Weight: 169 lb (76.658 kg) IBW/kg (Calculated) : 54.7  Vital Signs: Temp: 97.5 F (36.4 C) (02/18 1137) Temp Source: Oral (02/18 1137) BP: 110/85 mmHg (02/18 1137) Pulse Rate: 52 (02/18 1137)  Labs:  Recent Labs  05/28/14 2341 05/29/14 0444 05/29/14 1140 05/29/14 1332 05/29/14 1931 05/30/14 0451 05/30/14 1225  HGB 15.2*  --   --   --   --  13.8  --   HCT 44.8  --   --   --   --  41.0  --   PLT 133*  --   --   --   --  109*  --   APTT 32  --   --   --   --   --   --   LABPROT 30.2*  --   --   --  25.8* 22.5*  --   INR 2.86*  --   --   --  2.34* 1.96*  --   HEPARINUNFRC  --   --   --   --   --  0.40 0.66  CREATININE 2.52*  --   --   --   --  1.10  --   TROPONINI  --  0.24* 0.26* 0.18*  --   --   --     Estimated Creatinine Clearance: 51.1 mL/min (by C-G formula based on Cr of 1.1).  Assessment: [age] YOF with mech MVR on warf PTA presenting with AFib/RVR. Patient continues on heparin which is therapeutic x2 on 1100 units/hr. No bleeding noted, Hgb down to normal at 13.8, pltc low at 109 but stable.  TEE today shows large thrombus in LAA, DCCV was not done. D/w Dr. Delton SeeNelson and will restart warfarin tonight targeting INR of 2.5-3.5. INR is currently 1.9. Last dose of warfarin was 2/16.  Goal of Therapy:  INR goal 2.5-3.5 Heparin level 0.3-0.7 units/ml Monitor platelets by anticoagulation protocol: Yes   Plan:  -Warfarin 10mg  tonight -Reduce heparin to 1050 units/hr -Recheck HL tonight -Daily CBC/HL -Monitor for bleeding  Sheppard CoilFrank Markian Glockner PharmD., BCPS Clinical Pharmacist Pager [phone] 05/30/2014 2:22 PM

## 2014-05-30 NOTE — Care Management Note (Addendum)
    Page 1 of 1   06/07/2014     4:52:47 PM CARE MANAGEMENT NOTE 06/07/2014  Patient:  Rachel Cantu,Rachel Cantu   Account Number:  [id number]  Date Initiated:  05/30/2014  Documentation initiated by:  GRAVES-BIGELOW,BRENDA  Subjective/Objective Assessment:   Pt admitted for Atrial fibrillation with RVR. Initiated on Cardizem gtt.     Action/Plan:   CM to monitor for disposition needs.   Anticipated DC Date:  06/11/2014   Anticipated DC Plan:  HOME W HOME HEALTH SERVICES      DC Planning Services  CM consult      Choice offered to / List presented to:             Status of service:  Completed, signed off Medicare Important Message given?  YES (If response is "NO", the following Medicare IM given date fields will be blank) Date Medicare IM given:  05/31/2014 Medicare IM given by:  Gae GallopOLE,ANGELA Date Additional Medicare IM given:  06/07/2014 Additional Medicare IM given by:  Geran Haithcock  Discharge Disposition:    Per UR Regulation:  Reviewed for med. necessity/level of care/duration of stay  If discussed at Long Length of Stay Meetings, dates discussed:   06/04/2014    Comments:

## 2014-05-30 NOTE — CV Procedure (Signed)
See full TEE report in [employer]; large thrombus noted in LAA; DCCV not perfomed.  Rachel MillersBrian Jacob Cantu

## 2014-05-30 NOTE — Anesthesia Procedure Notes (Signed)
Procedure Name: MAC Date/Time: 05/30/2014 10:12 AM Performed by: Orvilla FusATO, Fumie Fiallo A Oxygen Delivery Method: Nasal cannula Placement Confirmation: positive ETCO2

## 2014-05-30 NOTE — Progress Notes (Signed)
Echocardiogram Echocardiogram Transesophageal has been performed.  [name], Rachel Cantu M 05/30/2014, 11:22 AM

## 2014-05-31 ENCOUNTER — Encounter (HOSPITAL_COMMUNITY): Payer: Self-pay | Admitting: Cardiology

## 2014-05-31 LAB — BASIC METABOLIC PANEL
Anion gap: 6 (ref 5–15)
BUN: 17 mg/dL (ref 6–23)
CO2: 27 mmol/L (ref 19–32)
CREATININE: 0.78 mg/dL (ref 0.50–1.10)
Calcium: 8.2 mg/dL — ABNORMAL LOW (ref 8.4–10.5)
Chloride: 103 mmol/L (ref 96–112)
GFR, EST NON AFRICAN AMERICAN: 87 mL/min — AB (ref >90)
Glucose, Bld: 124 mg/dL — ABNORMAL HIGH (ref 70–99)
Potassium: 2.9 mmol/L — ABNORMAL LOW (ref 3.5–5.1)
SODIUM: 136 mmol/L (ref 135–145)

## 2014-05-31 LAB — HEPARIN LEVEL (UNFRACTIONATED): HEPARIN UNFRACTIONATED: 0.52 [IU]/mL (ref 0.30–0.70)

## 2014-05-31 LAB — CBC
HEMATOCRIT: 40.7 % (ref 36.0–46.0)
HEMOGLOBIN: 13.7 g/dL (ref 12.0–15.0)
MCH: 31.1 pg (ref 26.0–34.0)
MCHC: 33.7 g/dL (ref 30.0–36.0)
MCV: 92.3 fL (ref 78.0–100.0)
Platelets: 109 10*3/uL — ABNORMAL LOW (ref 150–400)
RBC: 4.41 MIL/uL (ref 3.87–5.11)
RDW: 13.8 % (ref 11.5–15.5)
WBC: 7.7 10*3/uL (ref 4.0–10.5)

## 2014-05-31 LAB — PROTIME-INR
INR: 1.76 — AB (ref 0.00–1.49)
Prothrombin Time: 20.7 seconds — ABNORMAL HIGH (ref 11.6–15.2)

## 2014-05-31 MED ORDER — POTASSIUM CHLORIDE CRYS ER 20 MEQ PO TBCR
40.0000 meq | EXTENDED_RELEASE_TABLET | Freq: Every day | ORAL | Status: DC
  Administered 2014-05-31 – 2014-06-08 (×8): 40 meq via ORAL
  Filled 2014-05-31 (×11): qty 2

## 2014-05-31 MED ORDER — DILTIAZEM HCL ER COATED BEADS 240 MG PO CP24
240.0000 mg | ORAL_CAPSULE | Freq: Every day | ORAL | Status: DC
  Administered 2014-05-31: 240 mg via ORAL
  Filled 2014-05-31: qty 1

## 2014-05-31 MED ORDER — DILTIAZEM HCL ER COATED BEADS 120 MG PO CP24
120.0000 mg | ORAL_CAPSULE | Freq: Once | ORAL | Status: AC
  Administered 2014-05-31: 120 mg via ORAL
  Filled 2014-05-31: qty 1

## 2014-05-31 MED ORDER — WARFARIN SODIUM 2.5 MG PO TABS
12.5000 mg | ORAL_TABLET | Freq: Once | ORAL | Status: AC
  Administered 2014-05-31: 12.5 mg via ORAL
  Filled 2014-05-31 (×2): qty 1

## 2014-05-31 MED ORDER — POTASSIUM CHLORIDE CRYS ER 20 MEQ PO TBCR
40.0000 meq | EXTENDED_RELEASE_TABLET | Freq: Once | ORAL | Status: AC
  Administered 2014-05-31: 40 meq via ORAL
  Filled 2014-05-31: qty 2

## 2014-05-31 MED ORDER — DILTIAZEM HCL ER COATED BEADS 180 MG PO CP24
360.0000 mg | ORAL_CAPSULE | Freq: Every day | ORAL | Status: DC
  Administered 2014-06-01 – 2014-06-02 (×2): 360 mg via ORAL
  Filled 2014-05-31 (×2): qty 2

## 2014-05-31 NOTE — Clinical Documentation Improvement (Signed)
MD's, NP'S, and PA's    Documentation of "Atrial Fibrillation w RVR"  if appropriate  please provide the "type" of "atrial fibrillation".  Thank you  Possible Clinical Conditions?  Please clarify the type of atrial fibrillation: ? Chronic ? Paroxysmal ? Permanent ? Persistent ? Other (please specify type) ? Unable to determine ? Unknown   Risk Factors: Acute on Chronic Diastolic/Sytolic Heart Failure, Biventricular failure , Acute Renal Failure    Diagnostics: TEE, TTE  Treatment: IV Cardizem drip, heparin , po aspirin , DCCV not performed due to new diagnosis  Thank You, Rachel Cantu ,RN Clinical Documentation Specialist:  [phone]  [facility] Information [facility]

## 2014-05-31 NOTE — Progress Notes (Addendum)
Patient Name: Rachel DumasJanice E Cantu Date of Encounter: 05/31/2014     Active Problems:   Atrial fibrillation with RVR   S/P MVR (mitral valve replacement)   Acute diastolic CHF (congestive heart failure), NYHA class 3   Chronic anticoagulation   Atrial fibrillation with rapid ventricular response   Renal insufficiency   Acute systolic CHF (congestive heart failure), NYHA class 3   Renal failure    SUBJECTIVE  Denies any CP or SOB  CURRENT MEDS  . diltiazem  240 mg Oral Daily  . furosemide  20 mg Oral Daily  . [MAR Hold] pneumococcal 23 valent vaccine  0.5 mL Intramuscular Tomorrow-1000  . potassium chloride  40 mEq Oral Once  . potassium chloride  40 mEq Oral Daily  . sodium chloride  3 mL Intravenous Q12H  . warfarin  12.5 mg Oral ONCE-1800  . Warfarin - Pharmacist Dosing Inpatient   Does not apply q1800    . sodium chloride 1,000 mL (05/28/14 2350)  . heparin 1,050 Units/hr (05/30/14 2028)   OBJECTIVE  Filed Vitals:   05/30/14 2047 05/31/14 0243 05/31/14 0430 05/31/14 0924  BP: 117/79 142/88 144/124   Pulse: 85 61 78   Temp: 97.5 F (36.4 C)  98 F (36.7 C)   TempSrc: Oral  Oral   Resp: 18     Height:      Weight:    169 lb 1.5 oz (76.7 kg)  SpO2: 100%       Intake/Output Summary (Last 24 hours) at 05/31/14 1102 Last data filed at 05/31/14 0740  Gross per 24 hour  Intake 708.81 ml  Output   3050 ml  Net -2341.19 ml   Filed Weights   05/29/14 0400 05/30/14 0400 05/31/14 0924  Weight: 168 lb 12.8 oz (76.567 kg) 169 lb (76.658 kg) 169 lb 1.5 oz (76.7 kg)    PHYSICAL EXAM  General: Pleasant, NAD. Neuro: Alert and oriented X 3. Moves all extremities spontaneously. Psych: Normal affect. HEENT:  Normal  Neck: Supple without bruits or JVD. Lungs:  Resp regular and unlabored, CTA. Heart: RRR no s3, s4, or murmurs. Abdomen: Soft, non-tender, non-distended, BS + x 4.  Extremities: No clubbing, cyanosis or edema. DP/PT/Radials 2+ and equal  bilaterally.  Accessory Clinical Findings  CBC  Recent Labs  05/30/14 0451 05/31/14 0635  WBC 8.3 7.7  HGB 13.8 13.7  HCT 41.0 40.7  MCV 93.4 92.3  PLT 109* 109*   Basic Metabolic Panel  Recent Labs  05/30/14 0451 05/31/14 0635  NA 133* 136  K 3.0* 2.9*  CL 101 103  CO2 22 27  GLUCOSE 129* 124*  BUN 41* 17  CREATININE 1.10 0.78  CALCIUM 8.3* 8.2*   Liver Function Tests  Recent Labs  05/28/14 2341  AST 60*  ALT 48*  ALKPHOS 67  BILITOT 2.2*  PROT 5.3*  ALBUMIN 3.0*   No results for input(s): LIPASE, AMYLASE in the last 72 hours. Cardiac Enzymes  Recent Labs  05/29/14 0444 05/29/14 1140 05/29/14 1332  TROPONINI 0.24* 0.26* 0.18*   Thyroid Function Tests  Recent Labs  05/29/14 0444  TSH 1.494    TELE A-fib with HR 80s    ECG  No new EKG  Echocardiogram TEE 05/30/2014  Left ventricle: Systolic function was severely reduced. The estimated ejection fraction was in the range of 25% to 30%. Diffuse hypokinesis. - Aortic valve: No evidence of vegetation. There was trivial regurgitation. - Mitral valve: A mechanical prosthesis was present. There  was mild regurgitation. - Left atrium: The atrium was moderately dilated. There was an apparent, largethrombusin the appendage. There was mild spontaneous echo contrast (&quot;smoke&quot;) in the cavity. - Right ventricle: The cavity size was mildly dilated. Systolic function was severely reduced. - Right atrium: The atrium was mildly dilated. No evidence of thrombus in the atrial cavity or appendage. - Atrial septum: No defect or patent foramen ovale was identified. - Tricuspid valve: No evidence of vegetation. There was moderate-severe regurgitation. - Pulmonic valve: No evidence of vegetation.  Impressions:  - Severe global reduction in LV function; moderate LAE with large LAA thrombus noted; mild RAE/RVE with severely reduced RV function; mechanical MV with mild MR;  mildly elevated mean gradient of 9 mmHg; mild MR; mild AI; moderate to severe TR.    Radiology/Studies  US Renal  05/29/2014   CLINICAL DATA:  Renal failure.  Hypertension  EXAM: RENAL/URINARY TRACT ULTRASOUND COMPLETE  COMPARISON:  None.  FINDINGS: Right Kidney:  Length: 10.1 cm. Echogenicity within normal limits. No mass or hydronephrosis visualized.  Left Kidney:  Length: 11.3 cm. Echogenicity within normal limits. No mass or hydronephrosis visualized.  Bladder:  Appears normal for degree of bladder distention.  Other:  Small right pleural effusion identified.  IMPRESSION: 1. Normal appearance of the kidneys. No explanation for patient's renal failure.   Electronically Signed   By: Signa Kell M.D.   On: 05/29/2014 10:45   Dg Chest Portable 1 View  05/28/2014   CLINICAL DATA:  Dyspnea.  Atrial fibrillation.  EXAM: PORTABLE CHEST - 1 VIEW  COMPARISON:  None  FINDINGS: There is marked cardiomegaly. There is prior sternotomy and valvuloplasty. Extensive vascular and interstitial congestion is present, without focal airspace consolidation. No large effusion is evident.  IMPRESSION: Marked cardiomegaly.  Probable mild congestive heart failure.   Electronically Signed   By: Ellery Plunk M.D.   On: 05/28/2014 23:46    ASSESSMENT AND PLAN 64 y.o. female w/ PMHx significant for rheumatic mitral dz s/p mech MVR, HTN who presented to [facility] on 05/29/2014 with complaints of 7 days of shortness of breath and palpitations --> found to be in heart failure with elevated troponin, afib with RVR. She has missed two doses of Coumadin the last week  1. A- fib with RVR is a new diagnosis but not unexpected given her history of mitral valve disease. TSH normal.  - CHA2DS2-VASC score 4 (CHF, HTN, age, female) - TEE 05/30/2014 showed large thrombus noted in LAA; DCCV was not perfomed - switch cardizem drip to PO cardizem - continue heparin--> with INR target 2.5-3.5, also add aspirin 81 mg po  daily - at some point may benefit from L&RHC if EF does not improve  2. Acute on chronic CHF - sec to a-fib with RVR - we will start lasix 20 mg po daily, Crea is improving  3. S/P mechanical MV - missed coumadin doses, TTE shows normally functioning mitral valve prosthesis with normal single disc excursion, minimally elevated transmitral velocities   4. Elevated troponin - demand ischemia sec to a-fib with RVR, now downtrending, on heparin drip  5. Biventricular failure - new LVEF 20-25%, 60% in 2009, moderately reduced RV function - possibly sec to a-fib with RVR, we will control HR fpr now, hold ACEI as acute renal failure  6. Acute renal failure - normal in 2013. Perhaps is due to decreased renal gradient due to decompensated heart failure. Held ARB. Improved today, we will follow.   7. Hypokalemia -  replete  Signed, Azalee Course PA-C Pager: [phone]   The patient was seen, examined and discussed with Azalee Course, PA-C and I agree with the above.   64 y.o. female w/ PMHx significant for rheumatic mitral dz s/p mech MVR, HTN who presented to [facility] on 05/29/2014 with complaints of 7 days of shortness of breath and palpitations --> found to be in heart failure with elevated troponin, afib with RVR. She has missed two doses of Coumadine the last week and was found to have left atrial appendage heavily filled with thrombus, DCCV was not perfomed, heparin was continued and coumadin restarted the last night, followed by pharmacy--> with INR target 2.5-3.5, also added aspirin 81 mg po daily.  She was in acute CHF on admission with very good response with only lasix 20 mg po daily, we will continue, now only mild fluid overload. Her HR is now controlled.   Once her HR under control and INR level 2.5 to 3.5 (today 1.76) she can be discharged home with an outpatient follow up and TEE to reassess LAA thrombus in 4-6 weeks.   Lars Masson 05/31/2014

## 2014-05-31 NOTE — Progress Notes (Signed)
ANTICOAGULATION CONSULT NOTE   Pharmacy Consult for Heparin/Warfarin Indication: Afib/MVR, LAA thrombus  Allergies  Allergen Reactions  . Sulfamethoxazole     REACTION: unspecified    Patient Measurements: Height: 5\' 4"  (162.6 cm) Weight: 169 lb 1.5 oz (76.7 kg) IBW/kg (Calculated) : 54.7  Vital Signs: Temp: 98 F (36.7 C) (02/19 0430) Temp Source: Oral (02/19 0430) BP: 144/124 mmHg (02/19 0430) Pulse Rate: 78 (02/19 0430)  Labs:  Recent Labs  05/28/14 2341 05/29/14 0444 05/29/14 1140 05/29/14 1332 05/29/14 1931  05/30/14 0451 05/30/14 1225 05/30/14 2100 05/31/14 0635  HGB 15.2*  --   --   --   --   --  13.8  --   --  13.7  HCT 44.8  --   --   --   --   --  41.0  --   --  40.7  PLT 133*  --   --   --   --   --  109*  --   --  109*  APTT 32  --   --   --   --   --   --   --   --   --   LABPROT 30.2*  --   --   --  25.8*  --  22.5*  --   --  20.7*  INR 2.86*  --   --   --  2.34*  --  1.96*  --   --  1.76*  HEPARINUNFRC  --   --   --   --   --   < > 0.40 0.66 0.49 0.52  CREATININE 2.52*  --   --   --   --   --  1.10  --   --  0.78  TROPONINI  --  0.24* 0.26* 0.18*  --   --   --   --   --   --   < > = values in this interval not displayed.  Estimated Creatinine Clearance: 70.3 mL/min (by C-G formula based on Cr of 0.78).  Assessment: [age] YOF with mech MVR on warf PTA presenting with AFib/RVR. Patient continues on heparin which is therapeutic on 1050 units/hr. No bleeding noted, CBC is stable.   Last dose of warfarin was 2/16. INR down to 1.7 this morning after missed dose on 2/17.    TEE today shows large thrombus in LAA, DCCV was not done on 2/18. Goal of Therapy:  INR goal 2.5-3.5 Heparin level 0.3-0.7 units/ml Monitor platelets by anticoagulation protocol: Yes   Plan:  -Warfarin 12.5 mg tonight -heparin at 1050 units/hr -Daily CBC/HL/INR -Monitor for bleeding  Sheppard CoilFrank Wilson PharmD., BCPS Clinical Pharmacist Pager [phone] 05/31/2014 10:37 AM

## 2014-05-31 NOTE — Discharge Instructions (Addendum)

## 2014-05-31 NOTE — Progress Notes (Addendum)
Patient's HR continue to be elevated at 90-120s range after receiving 240mg  mg diltiazem this morning, will give additional dose of 120mg  diltiazem and change to 360mg  long acting diltiazem tomorrow am  Signed, Azalee CourseHao Djuana Littleton PA Pager: [phone]  I have discussed the situation with patient husband and daughter and updated them on her progress at the patient's request to disclose her medical information to her family.  Ramond DialSigned, Javarius Tsosie PA Pager: [phone]

## 2014-06-01 DIAGNOSIS — I509 Heart failure, unspecified: Secondary | ICD-10-CM

## 2014-06-01 DIAGNOSIS — I5189 Other ill-defined heart diseases: Secondary | ICD-10-CM

## 2014-06-01 DIAGNOSIS — N19 Unspecified kidney failure: Secondary | ICD-10-CM

## 2014-06-01 DIAGNOSIS — E876 Hypokalemia: Secondary | ICD-10-CM

## 2014-06-01 DIAGNOSIS — R7989 Other specified abnormal findings of blood chemistry: Secondary | ICD-10-CM

## 2014-06-01 LAB — CBC
HCT: 43.4 % (ref 36.0–46.0)
Hemoglobin: 14.4 g/dL (ref 12.0–15.0)
MCH: 30.8 pg (ref 26.0–34.0)
MCHC: 33.2 g/dL (ref 30.0–36.0)
MCV: 92.7 fL (ref 78.0–100.0)
PLATELETS: 132 10*3/uL — AB (ref 150–400)
RBC: 4.68 MIL/uL (ref 3.87–5.11)
RDW: 14.1 % (ref 11.5–15.5)
WBC: 7.5 10*3/uL (ref 4.0–10.5)

## 2014-06-01 LAB — PROTIME-INR
INR: 1.88 — AB (ref 0.00–1.49)
PROTHROMBIN TIME: 21.8 s — AB (ref 11.6–15.2)

## 2014-06-01 LAB — HEPARIN LEVEL (UNFRACTIONATED): HEPARIN UNFRACTIONATED: 0.54 [IU]/mL (ref 0.30–0.70)

## 2014-06-01 LAB — BASIC METABOLIC PANEL
Anion gap: 10 (ref 5–15)
BUN: 11 mg/dL (ref 6–23)
CHLORIDE: 105 mmol/L (ref 96–112)
CO2: 22 mmol/L (ref 19–32)
CREATININE: 0.71 mg/dL (ref 0.50–1.10)
Calcium: 8.7 mg/dL (ref 8.4–10.5)
GFR calc Af Amer: >90 mL/min (ref >90)
GFR calc non Af Amer: 89 mL/min — ABNORMAL LOW (ref >90)
GLUCOSE: 143 mg/dL — AB (ref 70–99)
POTASSIUM: 3.7 mmol/L (ref 3.5–5.1)
Sodium: 137 mmol/L (ref 135–145)

## 2014-06-01 MED ORDER — WARFARIN SODIUM 2.5 MG PO TABS
12.5000 mg | ORAL_TABLET | Freq: Once | ORAL | Status: AC
  Administered 2014-06-01: 12.5 mg via ORAL
  Filled 2014-06-01 (×2): qty 1

## 2014-06-01 NOTE — Progress Notes (Signed)
ANTICOAGULATION CONSULT NOTE   Pharmacy Consult for Heparin/Warfarin Indication: Afib/MVR, LAA thrombus  Allergies  Allergen Reactions  . Sulfamethoxazole     REACTION: unspecified    Patient Measurements: Height: 5\' 4"  (162.6 cm) Weight: 165 lb 4.8 oz (74.98 kg) IBW/kg (Calculated) : 54.7  Vital Signs: Temp: 97.9 F (36.6 C) (02/20 0400) Temp Source: Oral (02/20 0400) BP: 113/82 mmHg (02/20 0955) Pulse Rate: 49 (02/20 0400)  Labs:  Recent Labs  05/29/14 1140 05/29/14 1332  05/30/14 0451  05/30/14 2100 05/31/14 0635 06/01/14 0325  HGB  --   --   < > 13.8  --   --  13.7 14.4  HCT  --   --   --  41.0  --   --  40.7 43.4  PLT  --   --   --  109*  --   --  109* 132*  LABPROT  --   --   < > 22.5*  --   --  20.7* 21.8*  INR  --   --   < > 1.96*  --   --  1.76* 1.88*  HEPARINUNFRC  --   --   --  0.40  < > 0.49 0.52 0.54  CREATININE  --   --   --  1.10  --   --  0.78 0.71  TROPONINI 0.26* 0.18*  --   --   --   --   --   --   < > = values in this interval not displayed.  Estimated Creatinine Clearance: 69.5 mL/min (by C-G formula based on Cr of 0.71).  Assessment: [age] YOF with mech MVR on warf PTA presenting with AFib/RVR. Pharmacy consulted to dose warfarin.  PMH:  Mech MVR, HTN, new afib,  Coag/Heme: afib, TEE today shows large thrombus in LAA, DCCV was not done on 2/18. Warfarin started 2/18. Patient continues on heparin which is therapeutic on 1050 units/hr. INR remains below gaol. No bleeding noted, CBC is stable.  PTA warfarin dose = 10 mg daily  CV:  HF EF 20-25%, afib (CHADS VASC 4) Dilt, lasix, K 20 daily  Goal of Therapy:  INR goal 2.5-3.5 Heparin level 0.3-0.7 units/ml Monitor platelets by anticoagulation protocol: Yes   Plan:  -Warfarin 12.5 mg tonight -heparin at 1050 units/hr -Daily CBC/HL/INR -Monitor for bleeding  Thank you for allowing pharmacy to be a part of this patients care team.  Lovenia KimJulie Caedyn Tassinari Pharm.D., BCPS, AQ-Cardiology Clinical  Pharmacist 06/01/2014 11:30 AM Pager: [phone] Phone: [phone]

## 2014-06-01 NOTE — Progress Notes (Signed)
SUBJECTIVE: Very tangential when asked questions pertaining to her health. Talked about difficulty swallowing potassium tablets. Says breathing has improved. Remains tachycardic.     Intake/Output Summary (Last 24 hours) at 06/01/14 1109 Last data filed at 06/01/14 1033  Gross per 24 hour  Intake   1077 ml  Output   1376 ml  Net   -299 ml    Current Facility-Administered Medications  Medication Dose Route Frequency Provider Last Rate Last Dose  . 0.9 %  sodium chloride infusion  1,000 mL Intravenous Continuous Linwood Dibbles, MD 20 mL/hr at 05/28/14 2350 1,000 mL at 05/28/14 2350  . 0.9 %  sodium chloride infusion  250 mL Intravenous PRN Ardis Rowan, MD      . acetaminophen (TYLENOL) tablet 650 mg  650 mg Oral Q4H PRN Ardis Rowan, MD      . diltiazem [facility] CD) 24 hr capsule 360 mg  360 mg Oral Daily [place], [place]   360 mg at 06/01/14 1610  . furosemide (LASIX) tablet 20 mg  20 mg Oral Daily Lars Masson, MD   20 mg at 06/01/14 0954  . heparin ADULT infusion 100 units/mL (25000 units/250 mL)  1,050 Units/hr Intravenous Continuous Severiano Gilbert, RPH 10.5 mL/hr at 05/31/14 2100 1,050 Units/hr at 05/31/14 2100  . [MAR Hold] pneumococcal 23 valent vaccine (PNU-IMMUNE) injection 0.5 mL  0.5 mL Intramuscular Tomorrow-1000 Mihai Croitoru, MD      . potassium chloride SA (K-DUR,KLOR-CON) CR tablet 40 mEq  40 mEq Oral Daily Ok Anis, NP   40 mEq at 06/01/14 0954  . sodium chloride 0.9 % injection 3 mL  3 mL Intravenous Q12H Ardis Rowan, MD   3 mL at 06/01/14 0957  . sodium chloride 0.9 % injection 3 mL  3 mL Intravenous PRN Ardis Rowan, MD      . Warfarin - Pharmacist Dosing Inpatient   Does not apply q1800 Severiano Gilbert, RPH   0  at 05/30/14 1800    Filed Vitals:   05/31/14 1928 05/31/14 2005 06/01/14 0400 06/01/14 0955  BP: 141/76 141/76 110/46 113/82  Pulse:  52 49   Temp:  98 F (36.7 C) 97.9 F (36.6 C)   TempSrc:  Oral Oral   Resp:   22 20   Height:      Weight:   165 lb 4.8 oz (74.98 kg)   SpO2:  95% 97%     PHYSICAL EXAM General: NAD HEENT: Normal. Neck: No JVD, no thyromegaly.  Lungs: Clear to auscultation bilaterally with normal respiratory effort. CV: Nondisplaced PMI.  Tachycardic, irregular rhythm, normal S1/prosthetic S2, no S3, soft systolic murmur along left sternal border.  No pretibial edema.   Abdomen: Soft, nontender, no distention.  Neurologic: Alert and oriented x 3.  Psych: Normal affect. Musculoskeletal: Normal range of motion. No gross deformities. Extremities: No clubbing or cyanosis.   TELEMETRY: Reviewed telemetry pt in rapid atrial fibrillation.  LABS: Basic Metabolic Panel:  Recent Labs  96/04/54 0635 06/01/14 0325  NA 136 137  K 2.9* 3.7  CL 103 105  CO2 27 22  GLUCOSE 124* 143*  BUN 17 11  CREATININE 0.78 0.71  CALCIUM 8.2* 8.7   Liver Function Tests: No results for input(s): AST, ALT, ALKPHOS, BILITOT, PROT, ALBUMIN in the last 72 hours. No results for input(s): LIPASE, AMYLASE in the last 72 hours. CBC:  Recent Labs  05/31/14 0635 06/01/14 0325  WBC 7.7 7.5  HGB  13.7 14.4  HCT 40.7 43.4  MCV 92.3 92.7  PLT 109* 132*   Cardiac Enzymes:  Recent Labs  05/29/14 1140 05/29/14 1332  TROPONINI 0.26* 0.18*   BNP: Invalid input(s): POCBNP D-Dimer: No results for input(s): DDIMER in the last 72 hours. Hemoglobin A1C: No results for input(s): HGBA1C in the last 72 hours. Fasting Lipid Panel: No results for input(s): CHOL, HDL, LDLCALC, TRIG, CHOLHDL, LDLDIRECT in the last 72 hours. Thyroid Function Tests: No results for input(s): TSH, T4TOTAL, T3FREE, THYROIDAB in the last 72 hours.  Invalid input(s): FREET3 Anemia Panel: No results for input(s): VITAMINB12, FOLATE, FERRITIN, TIBC, IRON, RETICCTPCT in the last 72 hours.  RADIOLOGY: [place] Renal  05/29/2014   CLINICAL DATA:  Renal failure.  Hypertension  EXAM: RENAL/URINARY TRACT ULTRASOUND COMPLETE   COMPARISON:  None.  FINDINGS: Right Kidney:  Length: 10.1 cm. Echogenicity within normal limits. No mass or hydronephrosis visualized.  Left Kidney:  Length: 11.3 cm. Echogenicity within normal limits. No mass or hydronephrosis visualized.  Bladder:  Appears normal for degree of bladder distention.  Other:  Small right pleural effusion identified.  IMPRESSION: 1. Normal appearance of the kidneys. No explanation for patient's renal failure.   Electronically Signed   By: Signa Kellaylor  Stroud M.D.   On: 05/29/2014 10:45   Dg Chest Portable 1 View  05/28/2014   CLINICAL DATA:  Dyspnea.  Atrial fibrillation.  EXAM: PORTABLE CHEST - 1 VIEW  COMPARISON:  None  FINDINGS: There is marked cardiomegaly. There is prior sternotomy and valvuloplasty. Extensive vascular and interstitial congestion is present, without focal airspace consolidation. No large effusion is evident.  IMPRESSION: Marked cardiomegaly.  Probable mild congestive heart failure.   Electronically Signed   By: Ellery Plunkaniel R Mitchell M.D.   On: 05/28/2014 23:46      ASSESSMENT AND PLAN: 64 y.o. female w/ PMHx significant for rheumatic mitral dz s/p mech MVR, HTN who [facility] with complaints of 7 days of shortness of breath and palpitations --> found to be in heart failure with elevated troponin, afib with RVR. She had missed two doses of Coumadin the last week  1. A- fib with RVR is a new diagnosis but not unexpected given her history of mitral valve disease. TSH normal.  - CHA2DS2-VASC score 4 (CHF, HTN, age, female) - TEE 05/30/2014 showed large thrombus noted in LAA; DCCV was not perfomed.  - on long-acting diltiazem 360 mg with continued tachycardia. Will add short-acting metoprolol 25 mg bid. - continue heparin--> with INR target 2.5-3.5, also on aspirin 81 mg po daily. INR remains subtherapeutic today. - at some point may benefit from L&RHC if EF does not improve  2. Acute on chronic systolic CHF - sec to a-fib  with RVR - Will continue Lasix 20 mg po daily.  3. S/P mechanical MV - missed coumadin doses, TTE shows normally functioning mitral valve prosthesis with normal single disc excursion, minimally elevated transmitral velocities   4. Elevated troponin - demand ischemia sec to a-fib with RVR, now downtrending (0.18 on 2/17), on heparin drip  5. Biventricular failure - new LVEF 20-25%, 60% in 2009, moderately reduced RV function - possibly sec to a-fib with RVR, we will control HR fpr now, hold ACEI as acute renal failure  6. Acute renal failure - normal in 2013. Perhaps is due to decreased renal gradient due to decompensated heart failure. Held ARB. GFR improved to 89 ml/min today.   7. Hypokalemia - normal today.  Dispo: Once her HR under control and INR level 2.5 to 3.5 (today 1.88) she can be discharged to home with an outpatient follow up and TEE to reassess LAA thrombus in 4-6 weeks.    Prentice Docker, M.D., F.A.C.C.

## 2014-06-02 ENCOUNTER — Inpatient Hospital Stay (HOSPITAL_COMMUNITY): Payer: Medicare Other

## 2014-06-02 DIAGNOSIS — I429 Cardiomyopathy, unspecified: Secondary | ICD-10-CM

## 2014-06-02 DIAGNOSIS — I4891 Unspecified atrial fibrillation: Principal | ICD-10-CM

## 2014-06-02 DIAGNOSIS — I9589 Other hypotension: Secondary | ICD-10-CM

## 2014-06-02 DIAGNOSIS — Z954 Presence of other heart-valve replacement: Secondary | ICD-10-CM

## 2014-06-02 DIAGNOSIS — R0902 Hypoxemia: Secondary | ICD-10-CM

## 2014-06-02 DIAGNOSIS — I248 Other forms of acute ischemic heart disease: Secondary | ICD-10-CM

## 2014-06-02 DIAGNOSIS — I519 Heart disease, unspecified: Secondary | ICD-10-CM

## 2014-06-02 DIAGNOSIS — Z7901 Long term (current) use of anticoagulants: Secondary | ICD-10-CM

## 2014-06-02 DIAGNOSIS — I5041 Acute combined systolic (congestive) and diastolic (congestive) heart failure: Secondary | ICD-10-CM

## 2014-06-02 LAB — URINALYSIS, ROUTINE W REFLEX MICROSCOPIC
Bilirubin Urine: NEGATIVE
Glucose, UA: NEGATIVE mg/dL
Ketones, ur: NEGATIVE mg/dL
LEUKOCYTES UA: NEGATIVE
NITRITE: NEGATIVE
PROTEIN: NEGATIVE mg/dL
SPECIFIC GRAVITY, URINE: 1.008 (ref 1.005–1.030)
UROBILINOGEN UA: 1 mg/dL (ref 0.0–1.0)
pH: 6.5 (ref 5.0–8.0)

## 2014-06-02 LAB — BLOOD GAS, ARTERIAL
ACID-BASE DEFICIT: 8.7 mmol/L — AB (ref 0.0–2.0)
Bicarbonate: 16.1 mEq/L — ABNORMAL LOW (ref 20.0–24.0)
Drawn by: 283381
O2 Content: 2 L/min
O2 SAT: 96.7 %
PO2 ART: 107 mmHg — AB (ref 80.0–100.0)
Patient temperature: 98.6
TCO2: 17.1 mmol/L (ref 0–100)
pCO2 arterial: 31.9 mmHg — ABNORMAL LOW (ref 35.0–45.0)
pH, Arterial: 7.325 — ABNORMAL LOW (ref 7.350–7.450)

## 2014-06-02 LAB — BASIC METABOLIC PANEL
Anion gap: 7 (ref 5–15)
BUN: 16 mg/dL (ref 6–23)
CALCIUM: 8.9 mg/dL (ref 8.4–10.5)
CO2: 23 mmol/L (ref 19–32)
Chloride: 107 mmol/L (ref 96–112)
Creatinine, Ser: 0.94 mg/dL (ref 0.50–1.10)
GFR calc Af Amer: 72 mL/min — ABNORMAL LOW (ref >90)
GFR calc non Af Amer: 62 mL/min — ABNORMAL LOW (ref >90)
GLUCOSE: 131 mg/dL — AB (ref 70–99)
Potassium: 3.9 mmol/L (ref 3.5–5.1)
SODIUM: 137 mmol/L (ref 135–145)

## 2014-06-02 LAB — CBC
HEMATOCRIT: 43.5 % (ref 36.0–46.0)
Hemoglobin: 14.5 g/dL (ref 12.0–15.0)
MCH: 30.9 pg (ref 26.0–34.0)
MCHC: 33.3 g/dL (ref 30.0–36.0)
MCV: 92.6 fL (ref 78.0–100.0)
PLATELETS: 139 10*3/uL — AB (ref 150–400)
RBC: 4.7 MIL/uL (ref 3.87–5.11)
RDW: 13.8 % (ref 11.5–15.5)
WBC: 7.8 10*3/uL (ref 4.0–10.5)

## 2014-06-02 LAB — URINE MICROSCOPIC-ADD ON

## 2014-06-02 LAB — HEPARIN LEVEL (UNFRACTIONATED)
Heparin Unfractionated: <0.1 IU/mL — ABNORMAL LOW (ref 0.30–0.70)
Heparin Unfractionated: 0.42 IU/mL (ref 0.30–0.70)

## 2014-06-02 LAB — POCT I-STAT 3, ART BLOOD GAS (G3+)
Acid-Base Excess: 1 mmol/L (ref 0.0–2.0)
Bicarbonate: 24.7 mEq/L — ABNORMAL HIGH (ref 20.0–24.0)
O2 Saturation: 100 %
Patient temperature: 98.6
TCO2: 26 mmol/L (ref 0–100)
pCO2 arterial: 36.2 mmHg (ref 35.0–45.0)
pH, Arterial: 7.442 (ref 7.350–7.450)
pO2, Arterial: 424 mmHg — ABNORMAL HIGH (ref 80.0–100.0)

## 2014-06-02 LAB — PROTIME-INR
INR: 2.81 — ABNORMAL HIGH (ref 0.00–1.49)
Prothrombin Time: 29.8 seconds — ABNORMAL HIGH (ref 11.6–15.2)

## 2014-06-02 LAB — GLUCOSE, CAPILLARY: Glucose-Capillary: 116 mg/dL — ABNORMAL HIGH (ref 70–99)

## 2014-06-02 LAB — MRSA PCR SCREENING: MRSA by PCR: NEGATIVE

## 2014-06-02 MED ORDER — FUROSEMIDE 10 MG/ML IJ SOLN
40.0000 mg | Freq: Two times a day (BID) | INTRAMUSCULAR | Status: AC
Start: 2014-06-02 — End: 2014-06-04
  Administered 2014-06-02 – 2014-06-04 (×4): 40 mg via INTRAVENOUS
  Filled 2014-06-02 (×5): qty 4

## 2014-06-02 MED ORDER — CHLORHEXIDINE GLUCONATE 0.12 % MT SOLN
15.0000 mL | Freq: Two times a day (BID) | OROMUCOSAL | Status: DC
  Administered 2014-06-02 – 2014-06-03 (×2): 15 mL via OROMUCOSAL
  Filled 2014-06-02 (×2): qty 15

## 2014-06-02 MED ORDER — CETYLPYRIDINIUM CHLORIDE 0.05 % MT LIQD: 7.0000 mL | Freq: Two times a day (BID) | OROMUCOSAL | Status: DC

## 2014-06-02 MED ORDER — DIGOXIN 0.25 MG/ML IJ SOLN
0.2500 mg | Freq: Once | INTRAMUSCULAR | Status: AC
  Administered 2014-06-02: 0.25 mg via INTRAVENOUS
  Filled 2014-06-02: qty 1

## 2014-06-02 MED ORDER — FUROSEMIDE 10 MG/ML IJ SOLN
20.0000 mg | Freq: Once | INTRAMUSCULAR | Status: AC
  Administered 2014-06-02: 20 mg via INTRAVENOUS
  Filled 2014-06-02: qty 2

## 2014-06-02 MED ORDER — METOPROLOL TARTRATE 1 MG/ML IV SOLN
5.0000 mg | Freq: Once | INTRAVENOUS | Status: AC
  Administered 2014-06-02: 5 mg via INTRAVENOUS
  Filled 2014-06-02: qty 5

## 2014-06-02 MED ORDER — PANTOPRAZOLE SODIUM 40 MG IV SOLR
40.0000 mg | INTRAVENOUS | Status: DC
  Administered 2014-06-02 – 2014-06-04 (×3): 40 mg via INTRAVENOUS
  Filled 2014-06-02 (×5): qty 40

## 2014-06-02 NOTE — Progress Notes (Signed)
I was called into the room because the primary nurse was assisting with another patient. The husband called out stating his wife "wasn't doing well." I immediately went into room to find the patient sitting on the side of the bed. Her BP was 128/76, rapid afib, HR in the 150s-160s. O2 sats 100 % on Ra but pt complaining of feeling as though she can't catch her breath. Supplemental oxygen placed on 2l [place] and Resp rate 28/min. Dr. Purvis SheffieldKoneswaran on the floor at time of this episode and orders received and followed through. Michelle NasutiElena, RN administered metoprolol as ordered. Husband at bedside and updated. Will closely monitor.

## 2014-06-02 NOTE — Consult Note (Signed)
PULMONARY / CRITICAL CARE MEDICINE   Name: Rachel Cantu MRN: [id number] DOB: Jun 13, 1950    ADMISSION DATE:  05/28/2014 CONSULTATION DATE:  06/02/14  REFERRING MD :  Cardiology  CHIEF COMPLAINT:  Resp failure  INITIAL PRESENTATION: 64 yr old E 35%, MVR , new ib rvr, pulm edema, distress  STUDIES:  Echo  2/18- Severe global reduction in LV function; moderate LAE with large LAA thrombus noted; mild RAE/RVE with severely reduced RV function; mechanical MV with mild MR; mildly elevated mean gradient of 9 mmHg; mild MR; mild AI; moderate to severe TR.  SIGNIFICANT EVENTS: 2/21- distress, BIPAP, fib   HISTORY OF PRESENT ILLNESS: 64 y.o. female w/ PMHx significant for rheumatic mitral dz s/p mech MVR, HTN who [facility] with complaints of 7 days of shortness of breath and palpitations Noted LE swelling as well which she has not had before. No chest pain but has noted her heart beating fast and irregularly. Presnted to ER with Afib rvr. Rate control agents started. Also found to have a LA clot. Heparin strted. Called today with Acute resp failure.   PAST MEDICAL HISTORY :   has a past medical history of Hypertension; History of chickenpox; S/P MVR (mitral valve replacement) (1994); Rheumatic fever; Heart murmur; and Atrial fibrillation with RVR (05/29/2014).  has past surgical history that includes Mitral valve replacement (1994); TEE without cardioversion (05/30/2014); and TEE without cardioversion (N/A, 05/30/2014). Prior to Admission medications   Medication Sig Start Date End Date Taking? Authorizing Provider  valsartan-hydrochlorothiazide (DIOVAN-HCT) 160-25 MG per tablet Take 1 tablet by mouth daily.  04/03/14  Yes Historical Provider, MD  warfarin (COUMADIN) 5 MG tablet TAKE 1 TO 2 TABLETS BY MOUTH DAILY Patient taking differently: Take 5-10 mg by mouth daily. Alternate taking 1 tablet one day then take 2 tablets the next day 02/22/14  Yes Madelin HeadingsWanda  K Panosh, MD  COUMADIN 5 MG tablet TAKE 1 TO 2 TABLETS BY MOUTH EVERY DAY Patient not taking: Reported on 05/29/2014 02/26/14   Baker PieriniPadonda B Campbell, FNP  olmesartan-hydrochlorothiazide (BENICAR HCT) 40-12.5 MG per tablet Take 1 tablet by mouth daily. Patient not taking: Reported on 05/29/2014 05/25/13   Madelin HeadingsWanda K Panosh, MD   Allergies  Allergen Reactions  . Sulfamethoxazole     REACTION: unspecified    FAMILY HISTORY:  has no family status information on file.  SOCIAL HISTORY:  reports that she has never smoked. She has never used smokeless tobacco. She reports that she does not drink alcohol or use illicit drugs.  REVIEW OF SYSTEMS: distress, cant obtain, poor historian  SUBJECTIVE: distress  VITAL SIGNS: Temp:  [97.3 F (36.3 C)-97.9 F (36.6 C)] 97.3 F (36.3 C) (02/21 1300) Pulse Rate:  [64-98] 98 (02/21 1300) Resp:  [18-31] 31 (02/21 0000) BP: (108-134)/(73-97) 134/73 mmHg (02/21 1300) SpO2:  [81 %-100 %] 100 % (02/21 1300) Weight:  [77.021 kg (169 lb 12.8 oz)-80.5 kg (177 lb 7.5 oz)] 80.5 kg (177 lb 7.5 oz) (02/21 1300) HEMODYNAMICS:   VENTILATOR SETTINGS:   INTAKE / OUTPUT:  Intake/Output Summary (Last 24 hours) at 06/02/14 1331 Last data filed at 06/02/14 1300  Gross per 24 hour  Intake  222.5 ml  Output    700 ml  Net -477.5 ml    PHYSICAL EXAMINATION: General:  Distress, tripod Neuro:  Alert, follows commands, nonfocal HEENT:  jvd Cardiovascular:  s1 s2 IRT  Lungs:  Crackles, bases Abdomen:  Soft, BS hypo,nt, nd Musculoskeletal:  edema  Skin:  No rash  LABS:  CBC  Recent Labs Lab 05/31/14 0635 06/01/14 0325 06/02/14 0430  WBC 7.7 7.5 7.8  HGB 13.7 14.4 14.5  HCT 40.7 43.4 43.5  PLT 109* 132* 139*   Coag's  Recent Labs Lab 05/28/14 2341  05/31/14 0635 06/01/14 0325 06/02/14 0430  APTT 32  --   --   --   --   INR 2.86*  < > 1.76* 1.88* 2.81*  < > = values in this interval not displayed. BMET  Recent Labs Lab 05/30/14 0451  05/31/14 0635 06/01/14 0325  NA 133* 136 137  K 3.0* 2.9* 3.7  CL 101 103 105  CO2 BUN 41* 17 11  CREATININE 1.10 0.78 0.71  GLUCOSE 129* 124* 143*   Electrolytes  Recent Labs Lab 05/30/14 0451 05/31/14 0635 06/01/14 0325  CALCIUM 8.3* 8.2* 8.7   Sepsis Markers No results for input(s): LATICACIDVEN, PROCALCITON, O2SATVEN in the last 168 hours. ABG  Recent Labs Lab 06/02/14 1220  PHART 7.325*  PCO2ART 31.9*  PO2ART 107.0*   Liver Enzymes  Recent Labs Lab 05/28/14 2341  AST 60*  ALT 48*  ALKPHOS 67  BILITOT 2.2*  ALBUMIN 3.0*   Cardiac Enzymes  Recent Labs Lab 05/29/14 0444 05/29/14 1140 05/29/14 1332  TROPONINI 0.24* 0.26* 0.18*   Glucose No results for input(s): GLUCAP in the last 168 hours.  Imaging No results found.   ASSESSMENT / PLAN:  PULMONARY A:Acute Resp failure, Pulm edema P:   STAT abg reviewed BIPAP STAT with active diureis plan Goal 30 min o , 4 hr on  pcxr in am  Neg balance goal Control Heart rate  CARDIOVASCULAR  A: CH, pulm edema, s/o MVR, new ib rvr, LA thrombus P:  Does not handle rvr well, rate control per cards Tele Lasix May need line heparin  RENAL A:  pulm edema P:   Chem in pm and am with active diureis Lasix to IV  GASTROINTESTINAL A:  distress P:   Add ppi NPO  HEMATOLOGIC A:  LA clot P:  Heparin drip Cbc in am   INFECTIOUS A:  No evidence inection, at risk HCAP P:   Pct Monitor temp pcxr in am  UA (yeast on skin )  ENDOCRINE A:  At risk hyperglcyemia P:   cbg  NEUROLOGIC A:  distress P:   RASS goal: 0 May need ,low dose ent, versed   FAMILY  - Updates: Hudband 2/21  - Inter-disciplinary family meet or Palliative Care meeting due by:  2/29 Ccm time 45 min      Mcarthur Rossetti. Tyson Alias, MD, FACP Pgr: [phone] [facility]  Pulmonary and Critical Care Medicine [facility] Pager: [phone]  06/02/2014, 1:31 PM

## 2014-06-02 NOTE — Progress Notes (Signed)
IV Hep gtt had been stopped from 7am until 1140am today.  Hep levels and CBC's were discontinued in epic and RN thought that Heparin gtt was also d/c.  Pharmacist informed RN that Hep gtt should NOT have been d/c and that it should continuously be running until at least 2 days of INR therapeutic.  Rapid Response also called due to Respiratory Distress and low BP (after 5mg  IV metoprolol given for HR=140's).  MD notified.  Port CXR ordered.  Vitals stable now.  BP=106/72; SpO2=99%, HR=120.  On 2L nasal canula.  Will continue to monitor closely.  Husband is at bedside.

## 2014-06-02 NOTE — Progress Notes (Signed)
Called into room as patient has had a sudden change in her blood pressure. BP in the 70s. She was becoming more tachypniec and sitting in the tripod position. Rapid response called and Dr. Purvis SheffieldKoneswaran still on floor and made aware of situation. I told him of pts blood pressure and her heart rate was in the 110s. Pt + for JVD. Lungs with fine crackles, o2 sats in the 90S on 2L [place]. Resp rate in the upper 30s and she was unable to speak but 2 words at a time. He was made aware of respiratory distress and he felt as though this was coming from her rapid afib and wanted the IV digoxin to be given. The husband was at the bedside and was concerned. I also paged Florentina AddisonKatie, PA to have her come and assess patient. Orders received for PCXR and ABG and results given to MD. Bipap placed on pt with respiratory therapist at bedside. Dr. Purvis SheffieldKoneswaran and Florentina AddisonKatie came to bedside and orders received for medications. IV lasix given as ordered. MD confirmed that was ok to give with her SBP in the 70s and it was given.  Recommendation made to MD for critical care consult to be made and he stated it was ok to call. Angelique Blonderenise, RRT called Dr. Bary RichardFienstein whom came to the bedside. Orders received to transfer to ICU and pt immediately taken to room 2H11. Husband at bedside

## 2014-06-02 NOTE — Progress Notes (Signed)
Removed patient from Bipap and placed her on 2lpm with Sp02=99%. Nurse and doctor interviewing patient with family at this time.

## 2014-06-02 NOTE — Progress Notes (Signed)
Patient Name: Rachel Cantu Date of Encounter: 06/02/2014     Active Problems:   Atrial fibrillation with RVR   S/P MVR (mitral valve replacement)   Acute diastolic CHF (congestive heart failure), NYHA class 3   Chronic anticoagulation   Atrial fibrillation with rapid ventricular response   Renal insufficiency   Acute systolic CHF (congestive heart failure), NYHA class 3   Renal failure    SUBJECTIVE  Patient very SOB and appears in acute distress initially. However, she calmed down while being examined. Not tolerating afib well.   CURRENT MEDS . digoxin  0.25 mg Intravenous Once  . diltiazem  360 mg Oral Daily  . furosemide  20 mg Oral Daily  . [MAR Hold] pneumococcal 23 valent vaccine  0.5 mL Intramuscular Tomorrow-1000  . potassium chloride  40 mEq Oral Daily  . sodium chloride  3 mL Intravenous Q12H  . Warfarin - Pharmacist Dosing Inpatient   Does not apply q1800    OBJECTIVE  Filed Vitals:   06/02/14 0400 06/02/14 0950 06/02/14 1105 06/02/14 1155  BP:  128/87 121/84 113/81  Pulse:   91 74  Temp: 97.9 F (36.6 C)     TempSrc: Oral     Resp:      Height:      Weight: 169 lb 12.8 oz (77.021 kg)     SpO2:   100% 81%    Intake/Output Summary (Last 24 hours) at 06/02/14 1215 Last data filed at 06/02/14 0004  Gross per 24 hour  Intake    202 ml  Output    700 ml  Net   -498 ml   Filed Weights   05/31/14 0924 06/01/14 0400 06/02/14 0400  Weight: 169 lb 1.5 oz (76.7 kg) 165 lb 4.8 oz (74.98 kg) 169 lb 12.8 oz (77.021 kg)    PHYSICAL EXAM  General: Tachypneic HEENT: Normal. Neck: +JVD, no thyromegaly.  Lungs: Clear to auscultation bilaterally with normal respiratory effort. CV: Tachycardic, irregular rhythm, normal S1/prosthetic S2, no S3, soft systolic murmur along left sternal border. 1+ pitting pretibial edema (no edema yesterday).  Abdomen: Soft, nontender, no distention.  Neurologic: Alert and oriented x 3.  Psych: Normal  affect. Musculoskeletal: No gross deformities. Extremities: No clubbing or cyanosis.  Accessory Clinical Findings  CBC  Recent Labs  06/01/14 0325 06/02/14 0430  WBC 7.5 7.8  HGB 14.4 14.5  HCT 43.4 43.5  MCV 92.7 92.6  PLT 132* 139*   Basic Metabolic Panel  Recent Labs  05/31/14 0635 06/01/14 0325  NA 136 137  K 2.9* 3.7  CL 103 105  CO2 27 22  GLUCOSE 124* 143*  BUN 17 11  CREATININE 0.78 0.71  CALCIUM 8.2* 8.7    TELE  Afib with RVR. HR 120s  Radiology/Studies  US Renal  05/29/2014   CLINICAL DATA:  Renal failure.  Hypertension  EXAM: RENAL/URINARY TRACT ULTRASOUND COMPLETE  COMPARISON:  None.  FINDINGS: Right Kidney:  Length: 10.1 cm. Echogenicity within normal limits. No mass or hydronephrosis visualized.  Left Kidney:  Length: 11.3 cm. Echogenicity within normal limits. No mass or hydronephrosis visualized.  Bladder:  Appears normal for degree of bladder distention.  Other:  Small right pleural effusion identified.  IMPRESSION: 1. Normal appearance of the kidneys. No explanation for patient's renal failure.   Electronically Signed   By: Signa Kell M.D.   On: 05/29/2014 10:45   Dg Chest Portable 1 View  05/28/2014   CLINICAL DATA:  Dyspnea.  Atrial fibrillation.  EXAM: PORTABLE CHEST - 1 VIEW  COMPARISON:  None  FINDINGS: There is marked cardiomegaly. There is prior sternotomy and valvuloplasty. Extensive vascular and interstitial congestion is present, without focal airspace consolidation. No large effusion is evident.  IMPRESSION: Marked cardiomegaly.  Probable mild congestive heart failure.   Electronically Signed   By: Ellery Plunk M.D.   On: 05/28/2014 23:46    ASSESSMENT AND PLAN 64 y.o. female w/ PMHx significant for rheumatic mitral dz s/p mech MVR, HTN who presented to [facility] on 05/29/2014 with complaints of 7 days of shortness of breath and palpitations --> found to be in heart failure with elevated troponin, afib with RVR. She had  missed two doses of Coumadin the last week  1. A- fib with RVR is a new diagnosis but not unexpected given her history of mitral valve disease. TSH normal.  - CHA2DS2-VASC score 4 (CHF, HTN, age, female) - TEE 05/30/2014 showed large thrombus noted in LAA; DCCV was not perfomed.  - She was on dilt gtt and then converted to po diltiazem 360 mg. This was discontinued with low EF. She is still having continued tachycardia and very uncomfortable. Given IV lopressor  today and ordered IV dig as she has hypotension. Amio not an option due to atrial thrombus and risk of cardioversion. Want to start esmolol gtt with hypotension but will need to transfer to ICU.  - INR target 2.5-3.5, also on aspirin 81 mg po daily. INR is now therapeutic at 2.81.   - at some point may benefit from L&RHC if EF does not improve  2. Acute on chronic systolic CHF - sec to a-fib with RVR - CHF on CXR. Will continue Lasix 20 mg po daily. Will one time dose of IV lasix  with close watch on BP with hypotension. INR is now therapeutic at 2.81.  3. S/P mechanical MV - missed coumadin doses, TTE shows normally functioning mitral valve prosthesis with normal single disc excursion, minimally elevated transmitral velocities  -- Continue coumadin.   4. Elevated troponin - demand ischemia sec to a-fib with RVR, now downtrending (0.18 on 2/17), on heparin drip  5. Biventricular failure - new LVEF 20-25%, 60% in 2009, moderately reduced RV function - possibly sec to a-fib with RVR, we will control HR for now, hold ACEI as acute renal failure  6. Acute renal failure - now resolved.   7. Hypokalemia - normal today.   Billy Fischer PA-C  Pager [phone]  The patient was seen and examined, and I agree with the assessment and plan as documented above, with modifications to note and also noted below. Pt markedly tachypneic this morning, complaining of palpitations and shortness of breath. In rapid atrial  fibrillation HR 120-130 bpm, which she is not tolerating. SBP 70-100 mmHg. Also noted to have lower extremity edema which she did not have yesterday, taking 20 mg daily Lasix. CXR shows mild CHF. I have administered one dose of 5 mg IV metoprolol and IV digoxin to lower HR. Will given one dose of IV Lasix 20 mg with close monitoring of BP for pulmonary and lower extremity edema. Difficult situation with respect to both HR control and contraindication to cardioversion due to left atrial appendage thrombus, for which she is on heparin and warfarin. HR control is problematic in that nondihydropyridine calcium channel blockers have negative inotropic effect and EF is 25-30% by TEE. I have d/c long-acting diltiazem. Would consider IV digoxin loading vs IV  esmolol drip. Pt may need BiPAP. Will transfer to ICU for ability to provide higher level of care.  Time spent: 60 minutes.

## 2014-06-02 NOTE — Progress Notes (Signed)
ANTICOAGULATION CONSULT NOTE   Pharmacy Consult for Heparin/Warfarin Indication: Afib/MVR, LAA thrombus  Allergies  Allergen Reactions  . Sulfamethoxazole     REACTION: unspecified    Patient Measurements: Height: 5\' 4"  (162.6 cm) Weight: 169 lb 12.8 oz (77.021 kg) IBW/kg (Calculated) : 54.7  Vital Signs: Temp: 97.9 F (36.6 C) (02/21 0400) Temp Source: Oral (02/21 0400) BP: 121/84 mmHg (02/21 1105) Pulse Rate: 91 (02/21 1105)  Labs:  Recent Labs  05/31/14 0635 06/01/14 0325 06/02/14 0430  HGB 13.7 14.4 14.5  HCT 40.7 43.4 43.5  PLT 109* 132* 139*  LABPROT 20.7* 21.8* 29.8*  INR 1.76* 1.88* 2.81*  HEPARINUNFRC 0.52 0.54 <0.10*  CREATININE 0.78 0.71  --     Estimated Creatinine Clearance: 70.4 mL/min (by C-G formula based on Cr of 0.71).  Assessment: [age] YOF with mech MVR on warf PTA admitted 05/28/2014  presenting with AFib/RVR. Pharmacy consulted to dose warfarin.  PMH:  Mech MVR, HTN, new afib,  Coag/Heme: afib, mechanical MVR, TEE today shows large thrombus in LAA, DCCV was not done on 2/18. Warfarin started 2/18. Patient continues on heparin which has been therapeutic on 1050 units/hr, AM level < 0.1, multiple IV problems overnight, order placed to repeat lab, which was delayed in lab>  On follow up found that was stopped by nursing and not restarted this am.  Per nurse there was confusion over what order was discontinued heparin level vs. Heparin. .  INR jumped significantly overnight from 1.8 to 2.8. No bleeding noted, CBC is stable.   PTA warfarin dose = 10 mg daily, but INR was   CV:  HF EF 20-25%, afib (CHADS VASC 4) Dilt, lasix, K 20 daily  Goal of Therapy:  INR goal 2.5-3.5 Heparin level 0.3-0.7 units/ml Monitor platelets by anticoagulation protocol: Yes   Plan:  -Warfarin 5 mg tonight Resume heparin at 1050 units/hr now, spoke with RN who is hanging now. -Daily CBC/HL/INR -Monitor for bleeding  Thank you for allowing pharmacy to be a part of  this patients care team.  Lovenia KimJulie Gyan Cambre Pharm.D., BCPS, AQ-Cardiology Clinical Pharmacist 06/02/2014 11:18 AM Pager: [phone] Phone: [phone]

## 2014-06-02 NOTE — Progress Notes (Signed)
ANTICOAGULATION CONSULT NOTE   Pharmacy Consult for Heparin Indication: Afib/MVR, LAA thrombus  Allergies  Allergen Reactions  . Sulfamethoxazole     REACTION: unspecified    Patient Measurements: Height: 5\' 4"  (162.6 cm) Weight: 169 lb 12.8 oz (77.021 kg) IBW/kg (Calculated) : 54.7  Vital Signs: Temp: 97.9 F (36.6 C) (02/21 0400) Temp Source: Oral (02/21 0400) BP: 125/97 mmHg (02/21 0000) Pulse Rate: 93 (02/20 2033)  Labs:  Recent Labs  05/31/14 0635 06/01/14 0325 06/02/14 0430  HGB 13.7 14.4  --   HCT 40.7 43.4  --   PLT 109* 132*  --   LABPROT 20.7* 21.8* 29.8*  INR 1.76* 1.88* 2.81*  HEPARINUNFRC 0.52 0.54 <0.10*  CREATININE 0.78 0.71  --     Estimated Creatinine Clearance: 70.4 mL/min (by C-G formula based on Cr of 0.71).  Assessment: [age] YOF with mech MVR on warf PTA presenting with AFib/RVR.  Heparin level this am reported as <0.1 units/ml.  Have some interruptions overnight per RN but not lasting more tah a few minutes.  Heparin level has been therapeutic last few checks on current rate.   Goal of Therapy:  Heparin level 0.3-0.7 units/ml Monitor platelets by anticoagulation protocol: Yes   Plan:  Redraw heparin level  Thanks for allowing pharmacy to be a part of this patient's care.  Talbert CageLora Daevion Navarette, PharmD Clinical Pharmacist, [phone]

## 2014-06-02 NOTE — Progress Notes (Signed)
eLink Physician-Brief Progress Note Patient Name: Rachel DumasJanice E Macphee DOB: 1950/08/01 MRN: [id number]   Date of Service  06/02/2014  HPI/Events of Note  Nurse notes change in mental status.  Patient is alert and not in distress.  She is moving all ext's but she is confused which is reportedly new.  eICU Interventions  CT of head w/o contrast now     Intervention Category Intermediate Interventions: Change in mental status - evaluation and management  Henry RusselSMITH, Dalessandro Baldyga, P 06/02/2014, 8:45 PM

## 2014-06-02 NOTE — Significant Event (Signed)
Rapid Response Event Note  Overview: Time Called: 1145 Arrival Time: 1147 Event Type: Cardiac, Respiratory, Hypotension  Initial Focused Assessment:  Called by RN to assess patient with Respiratory distress, A Fib 140-160's and low SBP 70's.  Upon my arrival to patients room, RN and husband at bedside.  Patient sitting on the side of the bed, tripoding, slightly lethargic, skin cool, and dusky and has JVD.  Patient on nasal cannula.  As per RN patient received 5 mg lopressor which dropped her pressure. Patient c/o SOB    Interventions:  ABG and PCXR as protocol.  MD at bedside.  ABG results reviewed and reported to MD.  Digoxin given by RN as per MD order.  Bipap started. Dr. Tyson AliasFeinstein called to evaluate patient.  Patient slightly better on BIPAP.       Event Summary:  Patient transported to 2 H 11, via bed with monitor and on BIPAP.  Primary RN giving report to ICU RN.     at      at          [facility], Rachel Cantu

## 2014-06-02 NOTE — Progress Notes (Signed)
Pharmacy called, reported that APTT had dropped but was continuing at 1050 un/hr rate, Pt has been sleeping sitting on side of bed with head and hands against top rail and has occluded IV several times during shift

## 2014-06-02 NOTE — Progress Notes (Signed)
ANTICOAGULATION CONSULT NOTE - Follow Up Consult  Pharmacy Consult for heparin Indication: afib/MVR, LAA thrombus  Allergies  Allergen Reactions  . Sulfamethoxazole     REACTION: unspecified    Patient Measurements: Height: 5\' 4"  (162.6 cm) Weight: 177 lb 7.5 oz (80.5 kg) IBW/kg (Calculated) : 54.7 Heparin Dosing Weight:   Vital Signs: Temp: 97.4 F (36.3 C) (02/21 1600) Temp Source: Axillary (02/21 1600) BP: 101/62 mmHg (02/21 1900) Pulse Rate: 35 (02/21 1900)  Labs:  Recent Labs  05/31/14 0635 06/01/14 0325 06/02/14 0430 06/02/14 0557 06/02/14 1508 06/02/14 1826  HGB 13.7 14.4 14.5  --   --   --   HCT 40.7 43.4 43.5  --   --   --   PLT 109* 132* 139*  --   --   --   LABPROT 20.7* 21.8* 29.8*  --   --   --   INR 1.76* 1.88* 2.81*  --   --   --   HEPARINUNFRC 0.52 0.54 <0.10* <0.10*  --  0.42  CREATININE 0.78 0.71  --   --  0.94  --     Estimated Creatinine Clearance: 61.2 mL/min (by C-G formula based on Cr of 0.94).   Medications:  Scheduled:  . antiseptic oral rinse  7 mL Mouth Rinse q12n4p  . chlorhexidine  15 mL Mouth Rinse BID  . furosemide  40 mg Intravenous Q12H  . pantoprazole (PROTONIX) IV  40 mg Intravenous Q24H  . [MAR Hold] pneumococcal 23 valent vaccine  0.5 mL Intramuscular Tomorrow-1000  . potassium chloride  40 mEq Oral Daily  . sodium chloride  3 mL Intravenous Q12H  . Warfarin - Pharmacist Dosing Inpatient   Does not apply q1800   Infusions:  . sodium chloride 1,000 mL (06/02/14 1300)  . heparin 1,050 Units/hr (06/02/14 1300)    Assessment: 64 yo female with afib/MVR, LAA thrombus is currently on therapeutic heparin.  Heparin level is 0.42.  Goal of Therapy:  Heparin level 0.3-0.7 units/ml Monitor platelets by anticoagulation protocol: Yes   Plan:  - continue heparin at 1050 units/hr - heparin level in am  Rachel Cantu, Tsz-Yin 06/02/2014,7:14 PM

## 2014-06-02 NOTE — Progress Notes (Signed)
Orrection, Heparin unfractioned level had dropped, not APTT as previously said

## 2014-06-03 DIAGNOSIS — G9340 Encephalopathy, unspecified: Secondary | ICD-10-CM

## 2014-06-03 LAB — BASIC METABOLIC PANEL
Anion gap: 6 (ref 5–15)
BUN: 16 mg/dL (ref 6–23)
CHLORIDE: 106 mmol/L (ref 96–112)
CO2: 26 mmol/L (ref 19–32)
Calcium: 8.7 mg/dL (ref 8.4–10.5)
Creatinine, Ser: 0.98 mg/dL (ref 0.50–1.10)
GFR calc Af Amer: 69 mL/min — ABNORMAL LOW (ref >90)
GFR, EST NON AFRICAN AMERICAN: 59 mL/min — AB (ref >90)
Glucose, Bld: 105 mg/dL — ABNORMAL HIGH (ref 70–99)
POTASSIUM: 4.1 mmol/L (ref 3.5–5.1)
Sodium: 138 mmol/L (ref 135–145)

## 2014-06-03 LAB — CBC
HCT: 42.6 % (ref 36.0–46.0)
Hemoglobin: 14.1 g/dL (ref 12.0–15.0)
MCH: 30.6 pg (ref 26.0–34.0)
MCHC: 33.1 g/dL (ref 30.0–36.0)
MCV: 92.4 fL (ref 78.0–100.0)
Platelets: 128 10*3/uL — ABNORMAL LOW (ref 150–400)
RBC: 4.61 MIL/uL (ref 3.87–5.11)
RDW: 13.9 % (ref 11.5–15.5)
WBC: 7.9 10*3/uL (ref 4.0–10.5)

## 2014-06-03 LAB — HEPARIN LEVEL (UNFRACTIONATED): HEPARIN UNFRACTIONATED: 0.5 [IU]/mL (ref 0.30–0.70)

## 2014-06-03 LAB — PROTIME-INR
INR: 3.84 — ABNORMAL HIGH (ref 0.00–1.49)
PROTHROMBIN TIME: 38.1 s — AB (ref 11.6–15.2)

## 2014-06-03 MED ORDER — CETYLPYRIDINIUM CHLORIDE 0.05 % MT LIQD
7.0000 mL | Freq: Two times a day (BID) | OROMUCOSAL | Status: DC
  Administered 2014-06-03: 7 mL via OROMUCOSAL

## 2014-06-03 MED ORDER — METOPROLOL TARTRATE 25 MG PO TABS
25.0000 mg | ORAL_TABLET | Freq: Two times a day (BID) | ORAL | Status: DC
  Administered 2014-06-03 – 2014-06-04 (×3): 25 mg via ORAL
  Filled 2014-06-03 (×4): qty 1

## 2014-06-03 MED ORDER — WARFARIN SODIUM 1 MG PO TABS
1.0000 mg | ORAL_TABLET | Freq: Once | ORAL | Status: AC
  Administered 2014-06-03: 1 mg via ORAL
  Filled 2014-06-03: qty 1

## 2014-06-03 MED ORDER — DIGOXIN 250 MCG PO TABS
0.2500 mg | ORAL_TABLET | Freq: Every day | ORAL | Status: DC
  Administered 2014-06-03 – 2014-06-07 (×5): 0.25 mg via ORAL
  Filled 2014-06-03 (×5): qty 1

## 2014-06-03 NOTE — Progress Notes (Signed)
Progress Note  Subjective:    Doing better this AM. Not SOB, rate controlled.   Objective:   Temp:  [97.3 F (36.3 C)-98.4 F (36.9 C)] 98.3 F (36.8 C) (02/22 0741) Pulse Rate:  [25-156] 43 (02/22 0700) Resp:  [14-30] 18 (02/22 0700) BP: (81-140)/(41-108) 140/86 mmHg (02/22 0700) SpO2:  [81 %-100 %] 100 % (02/22 0700) Weight:  [172 lb 2.9 oz (78.1 kg)-177 lb 7.5 oz (80.5 kg)] 172 lb 2.9 oz (78.1 kg) (02/22 0449) Last BM Date: 06/01/14  Filed Weights   06/02/14 0400 06/02/14 1300 06/03/14 0449  Weight: 169 lb 12.8 oz (77.021 kg) 177 lb 7.5 oz (80.5 kg) 172 lb 2.9 oz (78.1 kg)    Intake/Output Summary (Last 24 hours) at 06/03/14 [id number] Last data filed at 06/03/14 0600  Gross per 24 hour  Intake    369 ml  Output   2625 ml  Net  -2256 ml    Telemetry: Coarse atrial fibrillation, rate ~100  Physical Exam: General: AA female, alert, cooperative, NAD. Tangential.  HEENT: PERRL, EOMI. Moist mucus membranes Neck: Full range of motion without pain, supple, no lymphadenopathy or carotid bruits. JVD Lungs: Air entry equal bilaterally, bibasilar crackles. No wheezes.  Heart: Regular rate, irregular rhythm, no murmurs, gallops, or rubs Abdomen: Soft, non-tender, non-distended, BS + Extremities: No cyanosis, clubbing. +1 pitting edema L>R Neurologic: Alert & oriented x2, cranial nerves II-XII intact, strength grossly intact, sensation intact to light touch. SLightly confused, tangential speech.    Lab Results:  Basic Metabolic Panel:  Recent Labs Lab 06/01/14 0325 06/02/14 1508 06/03/14 0240  NA 137 137 138  K 3.7 3.9 4.1  CL 105 107 106  CO2 22 23 26   GLUCOSE 143* 131* 105*  BUN 11 16 16   CREATININE 0.71 0.94 0.98  CALCIUM 8.7 8.9 8.7    Liver Function Tests:  Recent Labs Lab 05/28/14 2341  AST 60*  ALT 48*  ALKPHOS 67  BILITOT 2.2*  PROT 5.3*  ALBUMIN 3.0*    CBC:  Recent Labs Lab 06/01/14 0325 06/02/14 0430 06/03/14 0246  WBC 7.5 7.8 7.9    HGB 14.4 14.5 14.1  HCT 43.4 43.5 42.6  MCV 92.7 92.6 92.4  PLT 132* 139* 128*    Cardiac Enzymes:  Recent Labs Lab 05/29/14 0444 05/29/14 1140 05/29/14 1332  TROPONINI 0.24* 0.26* 0.18*    Coagulation:  Recent Labs Lab 06/01/14 0325 06/02/14 0430 06/03/14 0246  INR 1.88* 2.81* 3.84*    Radiology: Dg Chest 1 View  06/02/2014   CLINICAL DATA:  Shortness of breath for 5 days  EXAM: CHEST  1 VIEW  COMPARISON:  05/28/2014  FINDINGS: Previous median sternotomy and mitral valve repair. The heart size is enlarged. There are small pleural effusions and mild interstitial edema.  IMPRESSION: 1. Congestive heart failure.   Electronically Signed   By: Signa Kellaylor  Stroud M.D.   On: 06/02/2014 12:23   Ct Head Wo Contrast  06/03/2014   CLINICAL DATA:  Mental status change.  New onset confusion.  EXAM: CT HEAD WITHOUT CONTRAST  TECHNIQUE: Contiguous axial images were obtained from the base of the skull through the vertex without intravenous contrast.  COMPARISON:  None.  FINDINGS: Generalized atrophy and chronic small vessel ischemic change. Encephalomalacia in the right parietal-occipital lobe consistent prior infarct. No intracranial hemorrhage, mass effect, or midline shift. No hydrocephalus. The basilar cisterns are patent. No evidence of acute territorial infarct. No intracranial fluid collection. Calvarium is intact. Included paranasal sinuses  and mastoid air cells are well aerated.  IMPRESSION: Atrophy, chronic small vessel ischemia, and remote prior infarct. No CT findings of acute intracranial abnormality.   Electronically Signed   By: Rubye Oaks M.D.   On: 06/03/2014 01:26      Medications:   Scheduled Medications: . antiseptic oral rinse  7 mL Mouth Rinse q12n4p  . chlorhexidine  15 mL Mouth Rinse BID  . furosemide  40 mg Intravenous Q12H  . pantoprazole (PROTONIX) IV  40 mg Intravenous Q24H  . [MAR Hold] pneumococcal 23 valent vaccine  0.5 mL Intramuscular Tomorrow-1000  .  potassium chloride  40 mEq Oral Daily  . sodium chloride  3 mL Intravenous Q12H  . Warfarin - Pharmacist Dosing Inpatient   Does not apply q1800     Infusions: . sodium chloride 1,000 mL (06/02/14 1300)  . heparin 1,050 Units/hr (06/02/14 1300)     PRN Medications:  sodium chloride, acetaminophen, sodium chloride   Assessment and Plan:  64 y/o F w/ PMHx of rheumatic heart disease s/p mechanical MVR, HTN, admitted on 05/29/14 w/ atrial fibrillation w/ RVR, also found to have LA thrombus. Patient developed respiratory distress yesterday (06/02/14), improved w/ BiPAP. No tolerating [place].   Atrial Fibrillation: Patient currently rate controlled in the 90-110's. TSH wnl, This patients CHA2DS2-VASc Score and unadjusted Ischemic Stroke Rate (% per year) is equal to 4.8 % stroke rate/year from a score of 4. TEE on 05/30/14 showed large LAA thrombus, no DCCV performed at that time. Had previously been on cardizem gtt and changed to po, however, this was discontinued d/t low EF. Also not candidate for Amiodarone given risk of CVA w/ LA thrombus w/ chemical cardioversion. Given Metoprolol IV + digoxin IV yesterday.  -Continue digoxin for now -Start Metoprolol po -Discontinue Heparin gtt -Continue Coumadin per pharmacy; INR currently 3.84  Acute on Chronic Systolic CHF: Acute exacerbation related to rapid atrial fibrillation. CXR from yesterday suggestive of CHF. ~3L urine output over past 24 hours (net -4.5L). Still w/ bibasilar crackles and JVD on exam. Cr stable currently.  -Continue Lasix 40 mg IV bid for today; decrease to qd tomorrow  Acute Encephalopathy: Acute mental status change overnight. CT head negative. Per RN report, had "split personality" overnight. Patient quite tangential w/ her speech, A&O x2 (does not know month or season). May be related to in-hospital delirium.  -Caution w/ sedative medications, etc.  -Continue to monitor  Respiratory Distress: Breathing appears stable this AM  on [place]. Not short of breath. Most likely related to pulmonary edema and tachycardia.  -Continue to monitor   Lauris Chroman, MD PGY-2 Internal Medicine Pager: [phone] Patient seen and examined and history reviewed. Agree with above findings and plan. Patient is doing much better today. Less SOB. sats improved. Off Bipap and now on [place]. Still has rales on exam. Rate control improved 90-100 in Afib. INR now 3.84. We will stop IV heparin. Target INR 3.0-3.5. Start oral metoprolol and digoxin for rate control. Not a candidate for DCCV with large LAA thrombus. Continue IV lasix today and probably convert to oral tomorrow. She does have a peculiar affect with tangential speech. Hopefully this will clear with improvement in her medical status. CT of the head showed no acute change.   Peter [place], MDFACC 06/03/2014 9:44 AM

## 2014-06-03 NOTE — Progress Notes (Signed)
Awakened patient for assessment to find her again confused to place, [facility] [employer] unable to tell me about her past medical history and why she is here at the hospital. Continue to monitor, A fib- controled rate - B/P stable.

## 2014-06-03 NOTE — Progress Notes (Signed)
PULMONARY / CRITICAL CARE MEDICINE   Name: Rachel Cantu MRN: [id number] DOB: 02-02-1951    ADMISSION DATE:  05/28/2014 CONSULTATION DATE:  06/02/14  REFERRING MD :  Cardiology  CHIEF COMPLAINT:  Resp failure  INITIAL PRESENTATION: 64 yr old E 35%, MVR , new ib rvr, pulm edema, distress  STUDIES:  Echo  2/18- Severe global reduction in LV function; moderate LAE with large LAA thrombus noted; mild RAE/RVE with severely reduced RV function; mechanical MV with mild MR; mildly elevated mean gradient of 9 mmHg; mild MR; mild AI; moderate to severe TR.  CT head 2/21 >> nothing acute  SIGNIFICANT EVENTS: 2/21- distress, BIPAP, fib   HISTORY OF PRESENT ILLNESS: 64 y.o. female w/ PMHx significant for rheumatic mitral dz s/p mech MVR, HTN who presented to [facility] on [facility]2014 with complaints of 7 days of shortness of breath and palpitations Noted LE swelling as well which she has not had before. No chest pain but has noted her heart beating fast and irregularly. Presnted to ER with Afib rvr. Rate control agents started. Also found to have a LA clot. Heparin strted. Called today with Acute resp failure.  SUBJECTIVE:  Wore BiPAP until about 20:00 2/21, then to [place] Some confusion last pm, also reported some confabulation and ? Multiple personality conversation w RN's; Head Ct performed  VITAL SIGNS: Temp:  [97.3 F (36.3 C)-98.4 F (36.9 C)] 98.3 F (36.8 C) (02/22 0741) Pulse Rate:  [25-156] 97 (02/22 1035) Resp:  [14-30] 16 (02/22 0800) BP: (81-140)/(41-111) 134/111 mmHg (02/22 1035) SpO2:  [81 %-100 %] 96 % (02/22 0800) Weight:  [78.1 kg (172 lb 2.9 oz)-80.5 kg (177 lb 7.5 oz)] 78.1 kg (172 lb 2.9 oz) (02/22 0449) HEMODYNAMICS:   VENTILATOR SETTINGS:   INTAKE / OUTPUT:  Intake/Output Summary (Last 24 hours) at 06/03/14 1105 Last data filed at 06/03/14 0918  Gross per 24 hour  Intake    540 ml  Output   3175 ml  Net  -2635 ml    PHYSICAL  EXAMINATION: General:  Comfortable in chair, NAD on [place] o2 Neuro:  Alert, somewhat tangential, no confusion or confabulation, follows commands, nonfocal HEENT:  OP clear Cardiovascular:  s1 s2 IRT  Lungs:  Soft Bibasilar crackles, no wheeze Abdomen:  Soft, BS hypo,nt, nd Musculoskeletal:  edema Skin:  No rash  LABS:  CBC  Recent Labs Lab 06/01/14 0325 06/02/14 0430 06/03/14 0246  WBC 7.5 7.8 7.9  HGB 14.4 14.5 14.1  HCT 43.4 43.5 42.6  PLT 132* 139* 128*   Coag's  Recent Labs Lab 05/28/14 2341  06/01/14 0325 06/02/14 0430 06/03/14 0246  APTT 32  --   --   --   --   INR 2.86*  < > 1.88* 2.81* 3.84*  < > = values in this interval not displayed. BMET  Recent Labs Lab 06/01/14 0325 06/02/14 1508 06/03/14 0240  NA 137 137 138  K 3.7 3.9 4.1  CL 105 107 106  CO2 BUN CREATININE 0.71 0.94 0.98  GLUCOSE 143* 131* 105*   Electrolytes  Recent Labs Lab 06/01/14 0325 06/02/14 1508 06/03/14 0240  CALCIUM 8.7 8.9 8.7   Sepsis Markers No results for input(s): LATICACIDVEN, PROCALCITON, O2SATVEN in the last 168 hours. ABG  Recent Labs Lab 06/02/14 1220 06/02/14 1841  PHART 7.325* 7.442  PCO2ART 31.9* 36.2  PO2ART 107.0* 424.0*   Liver Enzymes  Recent Labs Lab 05/28/14 2341  AST  60*  ALT 48*  ALKPHOS 67  BILITOT 2.2*  ALBUMIN 3.0*   Cardiac Enzymes  Recent Labs Lab 05/29/14 0444 05/29/14 1140 05/29/14 1332  TROPONINI 0.24* 0.26* 0.18*   Glucose  Recent Labs Lab 06/02/14 2046  GLUCAP 116*    Imaging Dg Chest 1 View  06/02/2014   CLINICAL DATA:  Shortness of breath for 5 days  EXAM: CHEST  1 VIEW  COMPARISON:  05/28/2014  FINDINGS: Previous median sternotomy and mitral valve repair. The heart size is enlarged. There are small pleural effusions and mild interstitial edema.  IMPRESSION: 1. Congestive heart failure.   Electronically Signed   By: Signa Kellaylor  Stroud M.D.   On: 06/02/2014 12:23   Ct Head Wo  Contrast  06/03/2014   CLINICAL DATA:  Mental status change.  New onset confusion.  EXAM: CT HEAD WITHOUT CONTRAST  TECHNIQUE: Contiguous axial images were obtained from the base of the skull through the vertex without intravenous contrast.  COMPARISON:  None.  FINDINGS: Generalized atrophy and chronic small vessel ischemic change. Encephalomalacia in the right parietal-occipital lobe consistent prior infarct. No intracranial hemorrhage, mass effect, or midline shift. No hydrocephalus. The basilar cisterns are patent. No evidence of acute territorial infarct. No intracranial fluid collection. Calvarium is intact. Included paranasal sinuses and mastoid air cells are well aerated.  IMPRESSION: Atrophy, chronic small vessel ischemia, and remote prior infarct. No CT findings of acute intracranial abnormality.   Electronically Signed   By: Rubye OaksMelanie  Ehinger M.D.   On: 06/03/2014 01:26     ASSESSMENT / PLAN:  PULMONARY A:Acute Resp failure, Pulm edema P:   BiPAp off, hypoxemia resolved with rate control and diuiresis Consider repeat CXR 2/23  CARDIOVASCULAR  A: Acute systolic pulm edema / CHF, h/o MVR, new A Fib + RVR, LA thrombus P:  Did not tolerate RVR, now improved with diuresis and rate control Lasix per cards plans Not a good candidate for amiodarone given LA clot Digoxin + metoprolol  RENAL A:  At risk ARF due to diuretics P:   Follow BMP  GASTROINTESTINAL A:  No issues P:   Add ppi NPO  HEMATOLOGIC A:  LA clot P:  Heparin drip > converted to coumadin 2/22  INFECTIOUS A:  No evidence inection, at risk HCAP P:   Follow clinically  ENDOCRINE A:  At risk hyperglcyemia P:   cbg  NEUROLOGIC A:  Tangential thoughts and conversation but no current evidence of delirium or multiple personalities P:   RASS goal: 0 Minimize mind-altering meds Consider psych eval if returns   FAMILY  - Updates: [place] 2/21   PCCM will sign off. Please call if we can help you  Levy Pupaobert  Byrum, MD, PhD 06/03/2014, 11:35 AM [facility] [phone] or if no answer [phone]

## 2014-06-03 NOTE — Progress Notes (Signed)
Dr.Deterding reviewed CT Scan. No recommendations offered. Patient sleeping

## 2014-06-03 NOTE — Progress Notes (Signed)
ANTICOAGULATION CONSULT NOTE - Follow Up Consult  Pharmacy Consult for Warfarin  Indication: afib/MVR, LAA thrombus  Allergies  Allergen Reactions  . Sulfamethoxazole     REACTION: unspecified    Patient Measurements: Height: 5\' 4"  (162.6 cm) Weight: 172 lb 2.9 oz (78.1 kg) IBW/kg (Calculated) : 54.7  Vital Signs: Temp: 98.3 F (36.8 C) (02/22 0741) Temp Source: Oral (02/22 0741) BP: 140/86 mmHg (02/22 0700) Pulse Rate: 43 (02/22 0700)  Labs:  Recent Labs  06/01/14 0325 06/02/14 0430 06/02/14 0557 06/02/14 1508 06/02/14 1826 06/03/14 0240 06/03/14 0246  HGB 14.4 14.5  --   --   --   --  14.1  HCT 43.4 43.5  --   --   --   --  42.6  PLT 132* 139*  --   --   --   --  128*  LABPROT 21.8* 29.8*  --   --   --   --  38.1*  INR 1.88* 2.81*  --   --   --   --  3.84*  HEPARINUNFRC 0.54 <0.10* <0.10*  --  0.42  --  0.50  CREATININE 0.71  --   --  0.94  --  0.98  --     Estimated Creatinine Clearance: 57.9 mL/min (by C-G formula based on Cr of 0.98).   Medications:  Scheduled:  . antiseptic oral rinse  7 mL Mouth Rinse q12n4p  . chlorhexidine  15 mL Mouth Rinse BID  . furosemide  40 mg Intravenous Q12H  . pantoprazole (PROTONIX) IV  40 mg Intravenous Q24H  . [MAR Hold] pneumococcal 23 valent vaccine  0.5 mL Intramuscular Tomorrow-1000  . potassium chloride  40 mEq Oral Daily  . sodium chloride  3 mL Intravenous Q12H  . Warfarin - Pharmacist Dosing Inpatient   Does not apply q1800   Infusions:  . sodium chloride 1,000 mL (06/02/14 1300)    Assessment: 64 yo female with new afib/MVR who was on Coumadin pta for LAA thrombus is currently on therapeutic heparin and Coumadin. HL remains therapeutic this AM but INR has continued to trend up sharply despite holding Coumadin dose yesterday. INR is supra-therapeutic at 3.84 today. Since dose was held yesterday, anticipate INR to stabilize and start trending down soon. No bleeding noted   Goal of Therapy:  INR 2.5-3.5   Monitor platelets by anticoagulation protocol: Yes   Plan:  - Stop heparin  - Coumadin 1 mg x 1 dose to avoid allowing the INR to drift to sub-therapeutic levels  - Monitor daily PT/INR and s/s of bleeding.   Vinnie LevelBenjamin Quetzalli Clos, PharmD., BCPS Clinical Pharmacist Pager [phone]

## 2014-06-03 NOTE — Progress Notes (Addendum)
Called Dr.Smith with [facility] concerning that patient is confused, trying to get OOB. Patient is able to move all extremities on command, Equal bilateral weak grips, unable to access pupils because patient strongly closes eyes, equal smile. Patient voice is clear yet patient repeats almost everything I say, speaks in reference to "OmaoKathy" . Patient keeps saying that "Olegario MessierKathy"  Wants you to follow the rules, Olegario MessierKathy follows the rules", patient doesn't make any sense with her sentence or sentence structure. Patients daughter called to check on her mother during this time of assessment in which I the nurse was able to ask the daughter certain questions comparing patients answers to my questions. Patient had stated that she had three children, in reality she only has one daughter. Patient unable to tell me her daughters name, her name, her address. Patient's husband called on the unit to check on patient in which I informed him of her confusion and asked him to come up to the hospital. When patients husband arrived patient did recognize him and cleared up slightly but was still talking in reference to a child name Olegario MessierKathy. Dr.Smith ordered CT scan, Patient taken down in bed with monitor and O2, tolerated procedure well. When we arrived back in the room patient was so different, she was able to tell me her first and last name, year and name of hospital. She was able to tell me her health history and want brought her to the hospital. Patients husband called her daughter and she was able to have a conversation which was clear and lucid. I spoke with patients daughter which she denied any psychological history. She does report that patient's mother had history of alzheimer's/dementia. Dr. Katrinka BlazingSmith aware of us returning from CT scan in which in viewed with no orders received. Husband went home

## 2014-06-04 ENCOUNTER — Encounter (HOSPITAL_COMMUNITY): Payer: Self-pay | Admitting: Certified Registered Nurse Anesthetist

## 2014-06-04 LAB — CBC
HCT: 41.1 % (ref 36.0–46.0)
Hemoglobin: 13.6 g/dL (ref 12.0–15.0)
MCH: 30.7 pg (ref 26.0–34.0)
MCHC: 33.1 g/dL (ref 30.0–36.0)
MCV: 92.8 fL (ref 78.0–100.0)
PLATELETS: 140 10*3/uL — AB (ref 150–400)
RBC: 4.43 MIL/uL (ref 3.87–5.11)
RDW: 13.8 % (ref 11.5–15.5)
WBC: 7 10*3/uL (ref 4.0–10.5)

## 2014-06-04 LAB — BASIC METABOLIC PANEL
ANION GAP: 8 (ref 5–15)
BUN: 16 mg/dL (ref 6–23)
CALCIUM: 8.5 mg/dL (ref 8.4–10.5)
CO2: 28 mmol/L (ref 19–32)
Chloride: 101 mmol/L (ref 96–112)
Creatinine, Ser: 0.9 mg/dL (ref 0.50–1.10)
GFR calc Af Amer: 76 mL/min — ABNORMAL LOW (ref >90)
GFR calc non Af Amer: 66 mL/min — ABNORMAL LOW (ref >90)
Glucose, Bld: 133 mg/dL — ABNORMAL HIGH (ref 70–99)
POTASSIUM: 3.5 mmol/L (ref 3.5–5.1)
SODIUM: 137 mmol/L (ref 135–145)

## 2014-06-04 LAB — PROTIME-INR
INR: 2.62 — ABNORMAL HIGH (ref 0.00–1.49)
Prothrombin Time: 28.2 seconds — ABNORMAL HIGH (ref 11.6–15.2)

## 2014-06-04 MED ORDER — WARFARIN SODIUM 7.5 MG PO TABS
7.5000 mg | ORAL_TABLET | Freq: Once | ORAL | Status: AC
  Administered 2014-06-04: 7.5 mg via ORAL
  Filled 2014-06-04: qty 1

## 2014-06-04 MED ORDER — METOPROLOL TARTRATE 50 MG PO TABS
50.0000 mg | ORAL_TABLET | Freq: Two times a day (BID) | ORAL | Status: DC
  Administered 2014-06-04 – 2014-06-05 (×2): 50 mg via ORAL
  Filled 2014-06-04 (×3): qty 1

## 2014-06-04 MED ORDER — OFF THE BEAT BOOK
Freq: Once | Status: AC
  Filled 2014-06-04: qty 1

## 2014-06-04 MED ORDER — FUROSEMIDE 40 MG PO TABS
40.0000 mg | ORAL_TABLET | Freq: Every day | ORAL | Status: DC
  Administered 2014-06-04 – 2014-06-12 (×9): 40 mg via ORAL
  Filled 2014-06-04 (×9): qty 1

## 2014-06-04 MED ORDER — METOPROLOL TARTRATE 25 MG PO TABS: 25.0000 mg | ORAL_TABLET | Freq: Once | ORAL | Status: AC

## 2014-06-04 NOTE — Progress Notes (Signed)
ANTICOAGULATION CONSULT NOTE - Follow Up Consult  Pharmacy Consult for Warfarin  Indication: afib/MVR, LAA thrombus  Allergies  Allergen Reactions  . Sulfamethoxazole     REACTION: unspecified    Patient Measurements: Height: 5\' 4"  (162.6 cm) Weight: 171 lb 8.3 oz (77.8 kg) IBW/kg (Calculated) : 54.7  Vital Signs: Temp: 98.3 F (36.8 C) (02/23 0800) Temp Source: Oral (02/23 0800) BP: 139/93 mmHg (02/23 0700) Pulse Rate: 52 (02/23 0700)  Labs:  Recent Labs  06/02/14 0430 06/02/14 0557 06/02/14 1508 06/02/14 1826 06/03/14 0240 06/03/14 0246 06/04/14 0305  HGB 14.5  --   --   --   --  14.1 13.6  HCT 43.5  --   --   --   --  42.6 41.1  PLT 139*  --   --   --   --  128* 140*  LABPROT 29.8*  --   --   --   --  38.1* 28.2*  INR 2.81*  --   --   --   --  3.84* 2.62*  HEPARINUNFRC <0.10* <0.10*  --  0.42  --  0.50  --   CREATININE  --   --  0.94  --  0.98  --  0.90    Estimated Creatinine Clearance: 62.9 mL/min (by C-G formula based on Cr of 0.9).   Medications:  Scheduled:  . antiseptic oral rinse  7 mL Mouth Rinse BID  . digoxin  0.25 mg Oral Daily  . furosemide  40 mg Oral Daily  . metoprolol tartrate  25 mg Oral BID  . pantoprazole (PROTONIX) IV  40 mg Intravenous Q24H  . [MAR Hold] pneumococcal 23 valent vaccine  0.5 mL Intramuscular Tomorrow-1000  . potassium chloride  40 mEq Oral Daily  . sodium chloride  3 mL Intravenous Q12H  . Warfarin - Pharmacist Dosing Inpatient   Does not apply q1800   Infusions:  . sodium chloride Stopped (06/03/14 1000)    Assessment: 64 yo female with new afib/MVR who was on Coumadin pta for LAA thrombus is currently on therapeutic heparin and Coumadin. HL is therapeutic today but has trended down to lower end of goal range as anticipated since Coumadin dose was held on 2/21. No bleeding noted   Goal of Therapy:  INR 2.5-3.5  Monitor platelets by anticoagulation protocol: Yes   Plan:  - Coumadin 7.5 mg x 1 dose to maintain  INR in upper end of goal range  - Monitor daily PT/INR and s/s of bleeding.   Vinnie LevelBenjamin Braylinn Gulden, PharmD., BCPS Clinical Pharmacist Pager [phone]

## 2014-06-04 NOTE — Progress Notes (Signed)
[facility] Safety precautions and orders reviewed with patient. Patient denied pain at this time. TELE applied and confirmed with CCMD. Patient HR in the 140s with activity. Patient denied any pain and appears in no distress at this time. TELE wants parameters so cardiology on call paged. Awaiting for call back.   Arrabella Westerman, RN.

## 2014-06-04 NOTE — Progress Notes (Signed)
Progress Note  Subjective:    No complaints, still seems confused this AM, unaware of the time of day.   Objective:   Temp:  [97.9 F (36.6 C)-98.8 F (37.1 C)] 98.2 F (36.8 C) (02/23 0400) Pulse Rate:  [25-137] 52 (02/23 0700) Resp:  [14-32] 28 (02/23 0700) BP: (91-161)/(49-111) 139/93 mmHg (02/23 0700) SpO2:  [91 %-100 %] 98 % (02/23 0700) Weight:  [171 lb 8.3 oz (77.8 kg)] 171 lb 8.3 oz (77.8 kg) (02/23 0400) Last BM Date: 06/01/14  Filed Weights   06/02/14 1300 06/03/14 0449 06/04/14 0400  Weight: 177 lb 7.5 oz (80.5 kg) 172 lb 2.9 oz (78.1 kg) 171 lb 8.3 oz (77.8 kg)    Intake/Output Summary (Last 24 hours) at 06/04/14 7829 Last data filed at 06/04/14 0600  Gross per 24 hour  Intake  418.2 ml  Output   3500 ml  Net -3081.8 ml    Telemetry: Atrial fibrillation, rate ~110, multiple PVC's  Physical Exam: General: AA female, alert, cooperative, NAD. Tangential.  HEENT: PERRL, EOMI. Moist mucus membranes Neck: Full range of motion without pain, supple, no lymphadenopathy or carotid bruits.  Lungs: Air entry equal bilaterally, bibasilar crackles. No wheezes.  Heart: Tachycardic, irregular rhythm, no murmurs, gallops, or rubs Abdomen: Soft, non-tender, non-distended, BS + Extremities: No cyanosis, clubbing. +1 pitting edema L>R Neurologic: Alert & oriented x3, cranial nerves II-XII intact, strength grossly intact, sensation intact to light touch. Tangential speech.    Lab Results:  Basic Metabolic Panel:  Recent Labs Lab 06/02/14 1508 06/03/14 0240 06/04/14 0305  NA 137 138 137  K 3.9 4.1 3.5  CL 107 106 101  CO2 GLUCOSE 131* 105* 133*  BUN CREATININE 0.94 0.98 0.90  CALCIUM 8.9 8.7 8.5    Liver Function Tests:  Recent Labs Lab 05/28/14 2341  AST 60*  ALT 48*  ALKPHOS 67  BILITOT 2.2*  PROT 5.3*  ALBUMIN 3.0*    CBC:  Recent Labs Lab 06/02/14 0430 06/03/14 0246 06/04/14 0305  WBC 7.8 7.9 7.0  HGB 14.5 14.1  13.6  HCT 43.5 42.6 41.1  MCV 92.6 92.4 92.8  PLT 139* 128* 140*    Cardiac Enzymes:  Recent Labs Lab 05/29/14 0444 05/29/14 1140 05/29/14 1332  TROPONINI 0.24* 0.26* 0.18*    Coagulation:  Recent Labs Lab 06/02/14 0430 06/03/14 0246 06/04/14 0305  INR 2.81* 3.84* 2.62*    Radiology: Dg Chest 1 View  06/02/2014   CLINICAL DATA:  Shortness of breath for 5 days  EXAM: CHEST  1 VIEW  COMPARISON:  05/28/2014  FINDINGS: Previous median sternotomy and mitral valve repair. The heart size is enlarged. There are small pleural effusions and mild interstitial edema.  IMPRESSION: 1. Congestive heart failure.   Electronically Signed   By: Signa Kell M.D.   On: 06/02/2014 12:23   Ct Head Wo Contrast  06/03/2014   CLINICAL DATA:  Mental status change.  New onset confusion.  EXAM: CT HEAD WITHOUT CONTRAST  TECHNIQUE: Contiguous axial images were obtained from the base of the skull through the vertex without intravenous contrast.  COMPARISON:  None.  FINDINGS: Generalized atrophy and chronic small vessel ischemic change. Encephalomalacia in the right parietal-occipital lobe consistent prior infarct. No intracranial hemorrhage, mass effect, or midline shift. No hydrocephalus. The basilar cisterns are patent. No evidence of acute territorial infarct. No intracranial fluid collection. Calvarium is intact. Included paranasal sinuses and mastoid air cells are well  aerated.  IMPRESSION: Atrophy, chronic small vessel ischemia, and remote prior infarct. No CT findings of acute intracranial abnormality.   Electronically Signed   By: Rubye OaksMelanie  Ehinger M.D.   On: 06/03/2014 01:26      Medications:   Scheduled Medications: . antiseptic oral rinse  7 mL Mouth Rinse BID  . digoxin  0.25 mg Oral Daily  . metoprolol tartrate  25 mg Oral BID  . pantoprazole (PROTONIX) IV  40 mg Intravenous Q24H  . [MAR Hold] pneumococcal 23 valent vaccine  0.5 mL Intramuscular Tomorrow-1000  . potassium chloride  40 mEq  Oral Daily  . sodium chloride  3 mL Intravenous Q12H  . Warfarin - Pharmacist Dosing Inpatient   Does not apply q1800    Infusions: . sodium chloride Stopped (06/03/14 1000)    PRN Medications: sodium chloride, acetaminophen, sodium chloride   Assessment and Plan:  64 y/o F w/ PMHx of rheumatic heart disease s/p mechanical MVR, HTN, admitted on 05/29/14 w/ atrial fibrillation w/ RVR, also found to have LA thrombus. Patient developed respiratory distress yesterday (06/02/14), improved w/ BiPAP. No tolerating [place].   Atrial Fibrillation: Patient currently in the 90-130's. Patients CHA2DS2-VASc Score and unadjusted Ischemic Stroke Rate (% per year) is equal to 4.8 % stroke rate/year from a score of 4. TEE on 05/30/14 showed large LAA thrombus, no DCCV performed at that time. Had previously been on cardizem gtt and changed to po, however, this was discontinued d/t low EF (20-25%). Also not candidate for Amiodarone given risk of CVA w/ LA thrombus w/ chemical cardioversion. -Continue Digoxin 0.25 mg daily -Continue Metoprolol 25 mg bid -Continue Coumadin per pharmacy; INR currently 2.62 this AM -Transfer to telemetry today  Acute on Chronic Systolic CHF: Acute exacerbation related to rapid atrial fibrillation. CXR on 06/02/14 suggestive of CHF. ~3.5 urine output over past 24 hours (net -7.5L). Cr stable currently.  -Change to Lasix 40 mg po qd -K-dur 40 mEq qd  Acute Encephalopathy: Acute mental status change overnight on 06/03/14. CT head negative. Patient quite tangential w/ her speech, still seems quite confused this AM. According to her daughter, [facility] and her baseline is quite different from her presentation her in the hospital. Patient w/ family history of Alzhiemer's Dementia.  -Caution w/ sedative medications, etc.  -Continue to monitor; consider psych consult if worsens  Respiratory Distress: Resolved. Related to pulmonary edema and  tachycardia. -Continue to monitor   Lauris ChromanWoody Jones, MD PGY-2 Internal Medicine Pager: [phone]   Patient seen and examined and history reviewed. Agree with above findings and plan. She is clearly better. HR control is variable with activity. Will increase metoprolol dose today. Excellent diuresis. Will switch to po lasix today. Pharmacy adjusting coumadin to target INR 3.0-3.5. Thought processes are still tangential. Will monitor for now. Plan to transfer to telemetry today.   Arora Coakley [place], MDFACC 06/04/2014 9:45 AM

## 2014-06-05 LAB — DIGOXIN LEVEL: Digoxin Level: 1 ng/mL (ref 0.8–2.0)

## 2014-06-05 LAB — CBC
HCT: 42.4 % (ref 36.0–46.0)
Hemoglobin: 14 g/dL (ref 12.0–15.0)
MCH: 30.6 pg (ref 26.0–34.0)
MCHC: 33 g/dL (ref 30.0–36.0)
MCV: 92.8 fL (ref 78.0–100.0)
PLATELETS: 169 10*3/uL (ref 150–400)
RBC: 4.57 MIL/uL (ref 3.87–5.11)
RDW: 14 % (ref 11.5–15.5)
WBC: 8 10*3/uL (ref 4.0–10.5)

## 2014-06-05 LAB — GLUCOSE, CAPILLARY: Glucose-Capillary: 122 mg/dL — ABNORMAL HIGH (ref 70–99)

## 2014-06-05 LAB — PROTIME-INR
INR: 2.18 — ABNORMAL HIGH (ref 0.00–1.49)
PROTHROMBIN TIME: 24.4 s — AB (ref 11.6–15.2)

## 2014-06-05 MED ORDER — WARFARIN SODIUM 10 MG PO TABS
10.0000 mg | ORAL_TABLET | Freq: Once | ORAL | Status: AC
Start: 2014-06-05 — End: 2014-06-05
  Administered 2014-06-05: 10 mg via ORAL
  Filled 2014-06-05: qty 1

## 2014-06-05 MED ORDER — HEPARIN (PORCINE) IN NACL 100-0.45 UNIT/ML-% IJ SOLN
1050.0000 [IU]/h | INTRAMUSCULAR | Status: DC
  Filled 2014-06-05: qty 250

## 2014-06-05 MED ORDER — METOPROLOL TARTRATE 50 MG PO TABS
75.0000 mg | ORAL_TABLET | Freq: Two times a day (BID) | ORAL | Status: DC
  Administered 2014-06-05 – 2014-06-09 (×8): 75 mg via ORAL
  Filled 2014-06-05 (×9): qty 1

## 2014-06-05 MED ORDER — ENOXAPARIN SODIUM 80 MG/0.8ML ~~LOC~~ SOLN
75.0000 mg | Freq: Two times a day (BID) | SUBCUTANEOUS | Status: DC
  Administered 2014-06-05 (×2): 75 mg via SUBCUTANEOUS
  Filled 2014-06-05 (×4): qty 0.8

## 2014-06-05 NOTE — Progress Notes (Signed)
   Asked by nurse to meet with family to answer questions.  Spent 30 minutes with dtr answering questions re: hospital presentation and course.  Discussed current plan.  Went over meds and pertinent lab data.  Offered education re: afib, LAA thrombus, MR, prosthetic mitral valves, CHF, LV dysfxn, and anticoagulation.  We further discussed sundowning and pts delirium that has been present at least temporally, since starting digoxin.  I have sent off a digoxin level and advised that we would have a low threshold to d/c digoxin if level returns elevated or if delirium persists - as it is now not just occurring at night.  All questions answered and dtr grateful for time and feedback.  Nicolasa Duckinghristopher Rydan Gulyas, NP 06/05/2014, 4:58 PM

## 2014-06-05 NOTE — Progress Notes (Addendum)
ANTICOAGULATION CONSULT NOTE - Follow Up Consult  Pharmacy Consult for LMWH Indication: mech MVR  Allergies  Allergen Reactions  . Sulfamethoxazole     REACTION: unspecified    Patient Measurements: Height: 5\' 4"  (162.6 cm) Weight: 163 lb 14.4 oz (74.345 kg) (scale b) IBW/kg (Calculated) : 54.7 Heparin Dosing Weight: 71 kg  Vital Signs: Temp: 98.5 F (36.9 C) (02/24 0500) Temp Source: Oral (02/24 0500) BP: 125/54 mmHg (02/24 0500) Pulse Rate: 56 (02/24 0500)  Labs:  Recent Labs  06/02/14 1508 06/02/14 1826 06/03/14 0240  06/03/14 0246 06/04/14 0305 06/05/14 0500  HGB  --   --   --   < > 14.1 13.6 14.0  HCT  --   --   --   --  42.6 41.1 42.4  PLT  --   --   --   --  128* 140* 169  LABPROT  --   --   --   --  38.1* 28.2* 24.4*  INR  --   --   --   --  3.84* 2.62* 2.18*  HEPARINUNFRC  --  0.42  --   --  0.50  --   --   CREATININE 0.94  --  0.98  --   --  0.90  --   < > = values in this interval not displayed.  Estimated Creatinine Clearance: 61.5 mL/min (by C-G formula based on Cr of 0.9).   Assessment: H63 YOF with mech MVR on warf PTA admitted 05/28/2014 presenting with AFib/RVR.  PMH: Mech MVR, HTN, new afib,  Coag/Heme: Warfarin pta for mechanical MVR; now new Afib, TEE shows large thrombus in LAA, DCCV was not done on 2/18. Warfarin resumed on 2/18. Patient on heparin 2/17-2/22 therapeutic on 1050 units/hr, INR jumped significantly from 1.8>>2.8>>3.84>>2.62>2.18. CBC Stable. Plts improved 169. Start LMWH today for INR<2.5. INR declining again due to dose held 2/21, and low dose 2/22. -PTA warfarin dose = alternates 5 mg and 10 mg - pt seems unsure  CV: BP 125/54, Brady, HF EF 20-25%, afib (CHADS VASC 4). Meds: digoxin 0.25 (CrCl 61, K=3.5), po furosemide, metoprolol, K40,  Endo: TSH nml  GI: AST and ALT slightly elevated along with TBili of 2.2 on 2/16. None since.  Neuro: A&O  Renal: AKI- holding ARB. SCr 2.52>0.9  Pulm: RA  PTA meds: Benicar HCT     Goal of Therapy:  Heparin level 0.3-0.7 INR 2.5-3.5 Monitor platelets by anticoagulation protocol: Yes   Plan:  Lovenox 75mg  BID for subtherapeutic INR Coumadin 10mg  po x 1 tonight. Daily INR, CBC q72h while on LMWH   Rexann Lueras S. Merilynn Finlandobertson, PharmD, BCPS Clinical Staff Pharmacist Pager [phone]  Misty Stanleyobertson, Jaloni Davoli Stillinger 06/05/2014,8:33 AM

## 2014-06-05 NOTE — Evaluation (Signed)
Physical Therapy Evaluation Patient Details Name: Rachel Cantu MRN: [id number] DOB: [birth year] Today's Date: 06/05/2014   History of Present Illness  64 y.o. female w/ PMHx significant for rheumatic mitral dz s/p mech MVR, HTN who presented to [facility] on 05/29/2014 with complaints of 7 days of shortness of breath and palpitations. She presents with her husband. She is a rather difficult historian to follow and answers questions repetitively and tangentially goes off.  She was admitted with a dx of a fib with RVR.  Clinical Impression  Pt admitted with above diagnosis. Pt currently with functional limitations due to the deficits listed below (see PT Problem List).  Pt will benefit from skilled PT to increase their independence and safety with mobility to allow discharge to the venue listed below.       Follow Up Recommendations Home health PT;Supervision/Assistance - 24 hour    Equipment Recommendations  None recommended by PT    Recommendations for Other Services       Precautions / Restrictions Precautions Precautions: None      Mobility  Bed Mobility Overal bed mobility: Modified Independent                Transfers Overall transfer level: Needs assistance Equipment used: None Transfers: Stand Pivot Transfers;Sit to/from Stand Sit to Stand: Min guard Stand pivot transfers: Min guard       General transfer comment: min guard for safety, no physical assist  Ambulation/Gait Ambulation/Gait assistance: Min assist Ambulation Distance (Feet): 75 Feet Assistive device: None Gait Pattern/deviations: Step-through pattern;Decreased stride length   Gait velocity interpretation: Below normal speed for age/gender    Stairs            Wheelchair Mobility    Modified Rankin (Stroke Patients Only)       Balance Overall balance assessment: Needs assistance Sitting-balance support: No upper extremity supported;Feet supported Sitting  balance-Leahy Scale: Good     Standing balance support: No upper extremity supported;During functional activity Standing balance-Leahy Scale: Fair                               Pertinent Vitals/Pain Pain Assessment: No/denies pain    Home Living Family/patient expects to be discharged to:: Private residence Living Arrangements: Spouse/significant other Available Help at Discharge: Family Type of Home: House Home Access: Level entry     Home Layout: Two level;Bed/bath upstairs Home Equipment: None Additional Comments: Pt is a poor historian and difficult to follow.  Unsure if above information is accurate.    Prior Function Level of Independence: Independent               Hand Dominance        Extremity/Trunk Assessment   Upper Extremity Assessment: Generalized weakness           Lower Extremity Assessment: Generalized weakness      Cervical / Trunk Assessment: Normal  Communication   Communication: No difficulties  Cognition Arousal/Alertness: Awake/alert Behavior During Therapy: WFL for tasks assessed/performed Overall Cognitive Status: No family/caregiver present to determine baseline cognitive functioning                      General Comments      Exercises        Assessment/Plan    PT Assessment Patient needs continued PT services  PT Diagnosis Difficulty walking;Generalized weakness   PT Problem List Decreased strength;Decreased activity tolerance;Decreased balance;Decreased  mobility;Decreased safety awareness  PT Treatment Interventions Gait training;Stair training;Functional mobility training;Therapeutic activities;Therapeutic exercise;Patient/family education;Balance training   PT Goals (Current goals can be found in the Care Plan section) Acute Rehab PT Goals Patient Stated Goal: home PT Goal Formulation: With patient Time For Goal Achievement: 06/19/14 Potential to Achieve Goals: Good    Frequency Min  3X/week   Barriers to discharge        Co-evaluation               End of Session Equipment Utilized During Treatment: Gait belt Activity Tolerance: Patient limited by fatigue Patient left: in chair;with call bell/phone within reach;with nursing/sitter in room Nurse Communication: Mobility status         Time: 1007-1020 PT Time Calculation (min) (ACUTE ONLY): 13 min   Charges:   PT Evaluation $Initial PT Evaluation Tier I: 1 Procedure     PT G CodesIlda Cantu:        Rachel Cantu Rachel Cantu 06/05/2014, 11:01 AM

## 2014-06-05 NOTE — Progress Notes (Addendum)
     SUBJECTIVE: She is feeling better this am. No chest pain or SOB.   BP 114/89 mmHg  Pulse 109  Temp(Src) 97.1 F (36.2 C) (Oral)  Resp 18  Ht 5\' 4"  (1.626 m)  Wt 163 lb 14.4 oz (74.345 kg)  BMI 28.12 kg/m2  SpO2 98%  Intake/Output Summary (Last 24 hours) at 06/05/14 1251 Last data filed at 06/05/14 1008  Gross per 24 hour  Intake    360 ml  Output    700 ml  Net   -340 ml    PHYSICAL EXAM General: Well developed, well nourished, in no acute distress. Alert and oriented x 3.  Psych:  Good affect, responds appropriately Neck: No JVD. No masses noted.  Lungs: Clear bilaterally with no wheezes or rhonci noted.  Heart: RRR with no murmurs noted. Abdomen: Bowel sounds are present. Soft, non-tender.  Extremities: No lower extremity edema.   LABS: Basic Metabolic Panel:  Recent Labs  16/01/9601/22/16 0240 06/04/14 0305  NA 138 137  K 4.1 3.5  CL 106 101  CO2 26 28  GLUCOSE 105* 133*  BUN 16 16  CREATININE 0.98 0.90  CALCIUM 8.7 8.5   CBC:  Recent Labs  06/04/14 0305 06/05/14 0500  WBC 7.0 8.0  HGB 13.6 14.0  HCT 41.1 42.4  MCV 92.8 92.8  PLT 140* 169   Current Meds: . digoxin  0.25 mg Oral Daily  . enoxaparin (LOVENOX) injection  75 mg Subcutaneous Q12H  . furosemide  40 mg Oral Daily  . metoprolol tartrate  50 mg Oral BID  . pantoprazole (PROTONIX) IV  40 mg Intravenous Q24H  . [MAR Hold] pneumococcal 23 valent vaccine  0.5 mL Intramuscular Tomorrow-1000  . potassium chloride  40 mEq Oral Daily  . sodium chloride  3 mL Intravenous Q12H  . warfarin  10 mg Oral ONCE-1800  . Warfarin - Pharmacist Dosing Inpatient   Does not apply q1800     ASSESSMENT AND PLAN: 64 y/o F w/ PMHx of rheumatic heart disease s/p mechanical MVR, HTN, admitted on 05/29/14 w/ atrial fibrillation w/ RVR, also found to have LA thrombus. Patient developed respiratory distress on 06/02/14 which improved w/ BiPAP. Transferred to tele from CCU on 06/04/14.   1. Atrial Fibrillation: Rate  still not optimally controlled on metoprolol and digoxin. Patients CHA2DS2-VASc Score and unadjusted Ischemic Stroke Rate (% per year) is equal to 4.8 % stroke rate/year from a score of 4. TEE on 05/30/14 showed large LAA thrombus, no DCCV performed at that time. Had previously been on cardizem gtt and changed to po, however, this was discontinued due to low EF (20-25%). Also not candidate for Amiodarone given risk of CVA w/ LA thrombus w/ chemical cardioversion. -Will continue Digoxin 0.25 mg daily -will titrate metoprolol for better rate control.  -Continue Coumadin per pharmacy; INR currently 2.18 this AM  2. Acute on Chronic Systolic CHF: She has diuresed well. Net -8 Liters since admission. Appears to be near euvolemia. Will continue po Lasix. Acute exacerbation related to rapid atrial fibrillation. Continue K-dur 40 mEq qd  3. Acute Encephalopathy: Mental status seems to be resolving.   4. Cardiomyopathy: Presumed to be related to atrial fib. She will need ischemic evaluation if no improvement in LVEF after rate control.   5. S/p MVR: on coumadin  MCALHANY,CHRISTOPHER  2/24/201612:51 PM

## 2014-06-05 NOTE — Progress Notes (Signed)
Nutrition Brief Note  Patient identified on the Malnutrition Screening Tool (MST) Report  Wt Readings from Last 15 Encounters:  06/05/14 163 lb 14.4 oz (74.345 kg)  01/09/13 179 lb (81.194 kg)  01/01/13 180 lb (81.647 kg)  12/18/12 182 lb (82.555 kg)  01/05/12 177 lb (80.287 kg)  11/17/11 180 lb (81.647 kg)  12/01/09 180 lb (81.647 kg)  10/11/08 174 lb (78.926 kg)  06/02/07 179 lb (81.194 kg)  11/15/05 182 lb (82.555 kg)    Body mass index is 28.12 kg/(m^2). Patient meets criteria for Overight based on current BMI. Patient states that she normally maintains her weight at 157 lbs.   Current diet order is Heart Healthy, patient is consuming approximately 100% of meals at this time. She reports that her appetite is good and she is eating normally. She mostly eats at home and uses Mrs. Dash instead of salt to season her food. Patient denies any nutrition related questions or concerns at this time. Labs and medications reviewed.   No nutrition interventions warranted at this time. If nutrition issues arise, please consult RD.   Ian Malkineanne Barnett RD, LDN Inpatient Clinical Dietitian Pager: [phone] After Hours Pager: [phone]

## 2014-06-06 ENCOUNTER — Inpatient Hospital Stay (HOSPITAL_COMMUNITY): Payer: Medicare Other

## 2014-06-06 ENCOUNTER — Encounter (HOSPITAL_COMMUNITY): Payer: Self-pay

## 2014-06-06 DIAGNOSIS — F05 Delirium due to known physiological condition: Secondary | ICD-10-CM

## 2014-06-06 LAB — BASIC METABOLIC PANEL
Anion gap: 13 (ref 5–15)
BUN: 19 mg/dL (ref 6–23)
CALCIUM: 8.9 mg/dL (ref 8.4–10.5)
CO2: 22 mmol/L (ref 19–32)
Chloride: 101 mmol/L (ref 96–112)
Creatinine, Ser: 0.85 mg/dL (ref 0.50–1.10)
GFR calc Af Amer: 82 mL/min — ABNORMAL LOW (ref >90)
GFR, EST NON AFRICAN AMERICAN: 70 mL/min — AB (ref >90)
GLUCOSE: 116 mg/dL — AB (ref 70–99)
POTASSIUM: 4 mmol/L (ref 3.5–5.1)
SODIUM: 136 mmol/L (ref 135–145)

## 2014-06-06 LAB — COMPREHENSIVE METABOLIC PANEL
ALK PHOS: 134 U/L — AB (ref 39–117)
ALT: 36 U/L — ABNORMAL HIGH (ref 0–35)
AST: 31 U/L (ref 0–37)
Albumin: 3.1 g/dL — ABNORMAL LOW (ref 3.5–5.2)
Anion gap: 15 (ref 5–15)
BILIRUBIN TOTAL: 1.3 mg/dL — AB (ref 0.3–1.2)
BUN: 21 mg/dL (ref 6–23)
CO2: 21 mmol/L (ref 19–32)
Calcium: 9 mg/dL (ref 8.4–10.5)
Chloride: 101 mmol/L (ref 96–112)
Creatinine, Ser: 0.88 mg/dL (ref 0.50–1.10)
GFR calc non Af Amer: 67 mL/min — ABNORMAL LOW (ref >90)
GFR, EST AFRICAN AMERICAN: 78 mL/min — AB (ref >90)
Glucose, Bld: 132 mg/dL — ABNORMAL HIGH (ref 70–99)
POTASSIUM: 4.4 mmol/L (ref 3.5–5.1)
Sodium: 137 mmol/L (ref 135–145)
TOTAL PROTEIN: 6.1 g/dL (ref 6.0–8.3)

## 2014-06-06 LAB — SEDIMENTATION RATE: SED RATE: 1 mm/h (ref 0–22)

## 2014-06-06 LAB — PROTIME-INR
INR: 2.82 — AB (ref 0.00–1.49)
Prothrombin Time: 29.9 seconds — ABNORMAL HIGH (ref 11.6–15.2)

## 2014-06-06 MED ORDER — WARFARIN SODIUM 7.5 MG PO TABS
7.5000 mg | ORAL_TABLET | Freq: Once | ORAL | Status: AC
  Administered 2014-06-06: 7.5 mg via ORAL
  Filled 2014-06-06: qty 1

## 2014-06-06 MED ORDER — PANTOPRAZOLE SODIUM 40 MG PO TBEC
40.0000 mg | DELAYED_RELEASE_TABLET | Freq: Every day | ORAL | Status: DC
  Administered 2014-06-06 – 2014-06-12 (×7): 40 mg via ORAL
  Filled 2014-06-06 (×5): qty 1

## 2014-06-06 NOTE — Progress Notes (Signed)
Extensive phone call with patient's daughter Baxter Flattery ensued today are receiving a request from patient's ex-husband to speak to his daughter.  CSW met with patient and she agreed for CSW to talk to her daughter.  The conversation today lasted over 1 hour; daughter was able to speak about her mother's journey from admission to now and is extremely concerned that her mother does not recognize her. Daughter noted that she has had multiple discussions with various MD's and has been told that her mother is experiencing some delirum that is improving- however- she does not understand why her mother does not recognize her acknowledge her as they have an extremely close relationship.  Daughter verbalized her frustration that she feels that everyone at the hospital that she speaks with tells her the same thing but she does not feel this is adequate to explain her mother's current mental status.  CSW provided support and encouragment as she also had several nursing concerns. Daughter was referred to [facility], Director of 3E and she also requested number to Service Recovery as she wanted to speak to her mother's "patient advocate"  (number provided). CSW explained the role of the CSW as well as the Interdisicplinary team and how we are all advocates for her mother- however she feels that no one is really trying to find out why her mother is experiencing the delirium. Daughter states this this not baseline for her mother.  CSW will be available as needed- daughter plans to take her mother back to [place] where she lives when her mother is released from the hospital.  Butch Penny T. Pauline Good, [place]

## 2014-06-06 NOTE — Consult Note (Signed)
NEURO HOSPITALIST CONSULT NOTE    Reason for Consult: AMS  HPI:                                                                                                                                          Rachel Cantu is an 64 y.o. female presenting to hospital due to complaints of SOB and palpitation for 1 week. Per daughter pateint had not been taking her coumadin due to laps in insurance.  In ER was noted to be in Afib with RVR and later found to have a LA clot. Heparin was initiated with plan to bridge to coumadin. While in hospital 06/02/2014  patient had episode of respiratory distress with SBP in 70's.That same day daughter felt she was not answering question appropriately in the evening. Later that day he was noted to be very confused. CT head was obtained and showed no acute changes. Digoxin level was obtained and was normal. INR currently 2.18. TSH WNL. Unfortunate patient cannot have MRI due to prosthetic valve. Daughter feels patient has not been the same.  She notes she is very slow in mentation, has a hard time following commands, and shows very little emotion compared to normal. Neurology was asked to see patient for AMS.   Past Medical History  Diagnosis Date  . Hypertension   . History of chickenpox   . S/P MVR (mitral valve replacement) 1994  . Rheumatic fever     as a child  . Heart murmur   . Atrial fibrillation with RVR 05/29/2014    Archie Endo 05/29/2014    Past Surgical History  Procedure Laterality Date  . Mitral valve replacement  1994  . Tee without cardioversion  05/30/2014  . Tee without cardioversion N/A 05/30/2014    Procedure: TRANSESOPHAGEAL ECHOCARDIOGRAM (TEE);  Surgeon: Lelon Perla, MD;  Location: [facility] ENDOSCOPY;  Service: Cardiovascular;  Laterality: N/A;    Family History  Problem Relation Age of Onset  . Heart disease Father     premature age 12  . Alzheimer's disease Mother     Social History:  reports that she has never smoked.  She has never used smokeless tobacco. She reports that she does not drink alcohol or use illicit drugs.  Allergies  Allergen Reactions  . Sulfamethoxazole     REACTION: unspecified    MEDICATIONS:  Prior to Admission:  Prescriptions prior to admission  Medication Sig Dispense Refill Last Dose  . valsartan-hydrochlorothiazide (DIOVAN-HCT) 160-25 MG per tablet Take 1 tablet by mouth daily.   7 05/28/2014 at Unknown time  . warfarin (COUMADIN) 5 MG tablet TAKE 1 TO 2 TABLETS BY MOUTH DAILY (Patient taking differently: Take 5-10 mg by mouth daily. Alternate taking 1 tablet one day then take 2 tablets the next day) 60 tablet 0 05/28/2014 at Unknown time  . COUMADIN 5 MG tablet TAKE 1 TO 2 TABLETS BY MOUTH EVERY DAY (Patient not taking: Reported on 05/29/2014) 60 tablet 1 Not Taking at Unknown time  . olmesartan-hydrochlorothiazide (BENICAR HCT) 40-12.5 MG per tablet Take 1 tablet by mouth daily. (Patient not taking: Reported on 05/29/2014) 30 tablet 5 Not Taking at Unknown time   Scheduled: . digoxin  0.25 mg Oral Daily  . furosemide  40 mg Oral Daily  . metoprolol tartrate  75 mg Oral BID  . pantoprazole  40 mg Oral Daily  . [MAR Hold] pneumococcal 23 valent vaccine  0.5 mL Intramuscular Tomorrow-1000  . potassium chloride  40 mEq Oral Daily  . sodium chloride  3 mL Intravenous Q12H  . warfarin  7.5 mg Oral ONCE-1800  . Warfarin - Pharmacist Dosing Inpatient   Does not apply q1800     ROS:                                                                                                                                       History obtained from the patient and and daughter  General ROS: negative for - chills, fatigue, fever, night sweats, weight gain or weight loss Psychological ROS: negative for - behavioral disorder, hallucinations, memory difficulties, mood swings or suicidal  ideation Ophthalmic ROS: negative for - blurry vision, double vision, eye pain or loss of vision ENT ROS: negative for - epistaxis, nasal discharge, oral lesions, sore throat, tinnitus or vertigo Allergy and Immunology ROS: negative for - hives or itchy/watery eyes Hematological and Lymphatic ROS: negative for - bleeding problems, bruising or swollen lymph nodes Endocrine ROS: negative for - galactorrhea, hair pattern changes, polydipsia/polyuria or temperature intolerance Respiratory ROS: negative for - cough, hemoptysis, shortness of breath or wheezing Cardiovascular ROS: negative for - chest pain, dyspnea on exertion, edema or irregular heartbeat Gastrointestinal ROS: negative for - abdominal pain, diarrhea, hematemesis, nausea/vomiting or stool incontinence Genito-Urinary ROS: negative for - dysuria, hematuria, incontinence or urinary frequency/urgency Musculoskeletal ROS: negative for - joint swelling or muscular weakness Neurological ROS: as noted in HPI Dermatological ROS: negative for rash and skin lesion changes   Blood pressure 126/68, pulse 65, temperature 97.4 F (36.3 C), temperature source Oral, resp. rate 18, height '5\' 4"'  (1.626 m), weight 74.163 kg (163 lb 8 oz), SpO2 100 %.   Neurologic Examination:  HEENT-  Normocephalic, no lesions, without obvious abnormality.  Normal external eye and conjunctiva.  Normal TM's bilaterally.  Normal auditory canals and external ears. Normal external nose, mucus membranes and septum.  Normal pharynx. Cardiovascular- irregularly irregular rhythm, pulses palpable throughout   Lungs- no tachypnea, retractions or cyanosis,  Abdomen- normal findings: bowel sounds normal Extremities- no edema Lymph-no adenopathy palpable Musculoskeletal-no joint tenderness, deformity or swelling Skin-warm and dry, no hyperpigmentation, vitiligo, or suspicious  lesions  Neurological Examination Mental Status: Alert, oriented to hospital year and moth.  She is not able to tell me how many quarters are in 2.75$ stating there are 4. She is unable to spell WORLD forward or backward. She is very slow to respond and has a hard time following complex steps.  Speech fluent without evidence of aphasia. Thought content is often tangential and at times does not make sense. Cranial Nerves: II: Discs flat bilaterally; Visual fields grossly normal, pupils equal, round, reactive to light and accommodation III,IV, VI: ptosis not present, extra-ocular motions intact bilaterally V,VII: smile symmetric, facial light touch sensation normal bilaterally VIII: hearing normal bilaterally IX,X: gag reflex present XI: bilateral shoulder shrug XII: midline tongue extension Motor: 4/5 throughout Tone and bulk:normal tone throughout; no atrophy noted Sensory: Pinprick and light touch intact but inconsistent.  At times she states her left side is decreased but then retracts that statement.  Deep Tendon Reflexes: 2+ and symmetric throughout Plantars: Right: downgoing   Left: downgoing Cerebellar: normal finger-to-nose, normal heel-to-shin test Gait: not tested for safety.       Lab Results: Basic Metabolic Panel:  Recent Labs Lab 06/01/14 0325 06/02/14 1508 06/03/14 0240 06/04/14 0305 06/06/14 0410  NA 137 137 138 137 136  K 3.7 3.9 4.1 3.5 4.0  CL 105 107 106 101 101  CO2 '22 23 26 28 22  ' GLUCOSE 143* 131* 105* 133* 116*  BUN '11 16 16 16 19  ' CREATININE 0.71 0.94 0.98 0.90 0.85  CALCIUM 8.7 8.9 8.7 8.5 8.9    Liver Function Tests: No results for input(s): AST, ALT, ALKPHOS, BILITOT, PROT, ALBUMIN in the last 168 hours. No results for input(s): LIPASE, AMYLASE in the last 168 hours. No results for input(s): AMMONIA in the last 168 hours.  CBC:  Recent Labs Lab 06/01/14 0325 06/02/14 0430 06/03/14 0246 06/04/14 0305 06/05/14 0500  WBC 7.5 7.8 7.9  7.0 8.0  HGB 14.4 14.5 14.1 13.6 14.0  HCT 43.4 43.5 42.6 41.1 42.4  MCV 92.7 92.6 92.4 92.8 92.8  PLT 132* 139* 128* 140* 169    Cardiac Enzymes: No results for input(s): CKTOTAL, CKMB, CKMBINDEX, TROPONINI in the last 168 hours.  Lipid Panel: No results for input(s): CHOL, TRIG, HDL, CHOLHDL, VLDL, LDLCALC in the last 168 hours.  CBG:  Recent Labs Lab 06/02/14 2046 06/05/14 0621  GLUCAP 116* 122*    Microbiology: Results for orders placed or performed during the hospital encounter of 05/28/14  MRSA PCR Screening     Status: None   Collection Time: 05/29/14  4:15 AM  Result Value Ref Range Status   MRSA by PCR NEGATIVE NEGATIVE Final    Comment:        The GeneXpert MRSA Assay (FDA approved for NASAL specimens only), is one component of a comprehensive MRSA colonization surveillance program. It is not intended to diagnose MRSA infection nor to guide or monitor treatment for MRSA infections.   MRSA PCR Screening     Status: None   Collection Time: 06/02/14  2:02 PM  Result Value Ref Range Status   MRSA by PCR NEGATIVE NEGATIVE Final    Comment:        The GeneXpert MRSA Assay (FDA approved for NASAL specimens only), is one component of a comprehensive MRSA colonization surveillance program. It is not intended to diagnose MRSA infection nor to guide or monitor treatment for MRSA infections.     Coagulation Studies:  Recent Labs  06/04/14 0305 06/05/14 0500 06/06/14 0410  LABPROT 28.2* 24.4* 29.9*  INR 2.62* 2.18* 2.82*    Imaging: No results found.   Etta Quill PA-C [facility] [phone]  06/06/2014, 2:23 PM  Patient seen and examined.  Clinical course and management discussed.  Necessary edits performed.  I agree with the above.  Assessment and plan of care developed and discussed below.    Assessment/Plan: 64 year old female with altered mental status.  Remaining neurological examination is non-focal.  Patient admitted  with atrial fibrillation.  INR subtherapeutic at that time.  Echocardiogram reveals an atrial thrombus and depressed LV function.  Can not rule out the possibility that the mental status change may be secondary to a shower of emboli in this clinical scenario.  Head CT from 2/21 personally reviewed and shows no acute changes.  Atrophy is noted.  Patient unable to have a MRI due to her prosthetic valve.  Digoxin level normal at 1.0.  No other possible offending medications noted in [facility].  No evidence of infection.  Initial lab work did reveal elevated LFT's.  Will repeat to rule out a hepatic etiology.    Recommendations: 1.  Repeat head CT without contrast 2.  ESR, CMET 3.  Will continue to follow with you  Discussion had at length with husband and daughter.      Alexis Goodell, MD [facility] [phone]  06/06/2014  4:01 PM

## 2014-06-06 NOTE — Progress Notes (Signed)
Pt is having conversations with staff and daughter. The conversations are very unorganized. She rambles a lot and switch back and forth to different topics. Some of the conversations make sense and some of it does not. Patient is alert and oriented to person, and place however she does not know who her daughter is until told.

## 2014-06-06 NOTE — Progress Notes (Signed)
ANTICOAGULATION CONSULT NOTE - Follow Up Consult  Pharmacy Consult for Coumadin Indication: mech MVR  Allergies  Allergen Reactions  . Sulfamethoxazole     REACTION: unspecified    Patient Measurements: Height: 5\' 4"  (162.6 cm) Weight: 163 lb 8 oz (74.163 kg) (scale B) IBW/kg (Calculated) : 54.7 Heparin Dosing Weight: 71 kg  Vital Signs: Temp: 98 F (36.7 C) (02/25 0300) Temp Source: Oral (02/25 0300) BP: 118/90 mmHg (02/25 0300) Pulse Rate: 79 (02/25 0300)  Labs:  Recent Labs  06/04/14 0305 06/05/14 0500 06/06/14 0410  HGB 13.6 14.0  --   HCT 41.1 42.4  --   PLT 140* 169  --   LABPROT 28.2* 24.4* 29.9*  INR 2.62* 2.18* 2.82*  CREATININE 0.90  --  0.85    Estimated Creatinine Clearance: 65.1 mL/min (by C-G formula based on Cr of 0.85).   Assessment: Rachel Cantu YOF with mech MVR on warf PTA admitted 05/28/2014 presenting with AFib/RVR.  PMH: Mech MVR, HTN, new afib,  Coag/Heme: Warfarin PTA for mechanical MVR; now new Afib, TEE shows large thrombus in LAA, DCCV was not done on 2/18. Warfarin resumed on 2/18. Patient on heparin 2/17-2/22. LMWH 2/24 for INR<2.5. INR  1.8>>2.8>>3.84>>2.62>2.18>2.82 today really bouncing around. CBC Stable. Plts improved 169. -PTA warfarin dose = alternates 5 mg and 10 mg (Admit INR 2.86)  CV: BP 118/90, Brady improved to 79-109, HF EF 20-25%, afib (CHADS VASC 4). Meds: digoxin 0.25 (CrCl 65, K=4), po furosemide, metoprolol, K40, - 2/24: Digoxin level =1.  Endo: TSH nml  GI: AST and ALT slightly elevated along with TBili of 2.2 on 2/16. None since.  Neuro: sundowning and delirium since staring digoxin.  Renal: AKI- holding ARB. SCr 2.52>0.85  Pulm: RA  PTA meds: Benicar HCT    Goal of Therapy:  INR 2.5-3.5 Monitor platelets by anticoagulation protocol: Yes   Plan:  D/c Lovenox Coumadin 7.5 mg po x 1 tonight, then resume home 5mg /10mg  alternating Daily INR   Rachel Cantu, PharmD, BCPS Clinical Staff  Pharmacist Pager [phone]  Misty Stanleyobertson, Jordie Schreur Stillinger 06/06/2014,8:33 AM

## 2014-06-06 NOTE — Progress Notes (Signed)
Spoke with daughter concerning patients orientation. Daughter stated that patient is very different from her home status. Also stated that patient was acting as her normal self on This past Saturday. The confusion started on Sunday. Her mother has been alert and oriented at home before coming to the hospital. Daughter came in from [place] on Tuesday and patient did not even know who she was. Spoke to MD Marta Antuhristopher Mc Alhany about this issue. Notified him that the daughter is just feeling likt things are not being addressed. The heart issues are being worked on, but she's more concerned about the confusion at this time. Neurologist has been consulted.

## 2014-06-06 NOTE — Progress Notes (Signed)
After speaking with daughter and assuring her that something would be done, daughter is feeling better and is satisfied that Rachel Cantu has been consulted.

## 2014-06-06 NOTE — Progress Notes (Signed)
SUBJECTIVE: Seen sitting in chair, daughter present in the room. Daughter with a lot of questions. Asking me to compare last CT w/ previous to make sure there is nothing wrong. She requested a digoxin level yesterday which i reported was normal.  Per daughter and patient, she still has orthopnea and PND. Breathing is not back to baseline.   BP 118/90 mmHg  Pulse 79  Temp(Src) 98 F (36.7 C) (Oral)  Resp 18  Ht 5\' 4"  (1.626 m)  Wt 163 lb 8 oz (74.163 kg)  BMI 28.05 kg/m2  SpO2 100%  Intake/Output Summary (Last 24 hours) at 06/06/14 [id number] Last data filed at 06/06/14 0600  Gross per 24 hour  Intake    460 ml  Output    900 ml  Net   -440 ml    PHYSICAL EXAM General: Well developed, well nourished, in no acute distress. Alert and oriented x 3.  Psych:  Good affect, responds appropriately Neck: No JVD. No masses noted.  Lungs: Clear bilaterally with no wheezes or rhonci noted.  Heart: RRR with no murmurs noted. Abdomen: Bowel sounds are present. Soft, non-tender.  Extremities: 1+ bilateral lower extremity edema.   LABS: Basic Metabolic Panel:  Recent Labs  28/41/3202/23/16 0305 06/06/14 0410  NA 137 136  K 3.5 4.0  CL 101 101  CO2 28 22  GLUCOSE 133* 116*  BUN 16 19  CREATININE 0.90 0.85  CALCIUM 8.5 8.9   CBC:  Recent Labs  06/04/14 0305 06/05/14 0500  WBC 7.0 8.0  HGB 13.6 14.0  HCT 41.1 42.4  MCV 92.8 92.8  PLT 140* 169   Current Meds: . digoxin  0.25 mg Oral Daily  . enoxaparin (LOVENOX) injection  75 mg Subcutaneous Q12H  . furosemide  40 mg Oral Daily  . metoprolol tartrate  75 mg Oral BID  . pantoprazole (PROTONIX) IV  40 mg Intravenous Q24H  . [MAR Hold] pneumococcal 23 valent vaccine  0.5 mL Intramuscular Tomorrow-1000  . potassium chloride  40 mEq Oral Daily  . sodium chloride  3 mL Intravenous Q12H  . Warfarin - Pharmacist Dosing Inpatient   Does not apply q1800     ASSESSMENT AND PLAN: 64 y/o F w/ PMHx of rheumatic heart disease s/p  mechanical MVR (1995), HTN, admitted on 05/29/14 w/ atrial fibrillation w/ RVR, also found to have LA thrombus. Patient developed respiratory distress on 06/02/14 which improved w/ BiPAP. Transferred to tele from CCU on 06/04/14.   1. Atrial Fibrillation with RVR: Rate much better controlled on metoprolol 75mg  BID and digoxin 0.25mg . Patients CHA2DS2-VASc Score and unadjusted Ischemic Stroke Rate (% per year) is equal to 4.8 % stroke rate/year from a score of 4. -- TEE on 05/30/14 showed large LAA thrombus, no DCCV performed at that time. Had previously been on cardizem gtt and changed to po, however, this was discontinued due to low EF (20-25%). Also not candidate for Amiodarone given risk of CVA w/ LA thrombus w/ chemical cardioversion. -- Will continue Digoxin 0.25 mg qd and metoprolol 75mg  BID  -- Continue Coumadin per pharmacy; INR currently 2.82 this AM  2. Acute on Chronic Systolic CHF:  EF (20-25%). She has diuresed well. Net -9 Liters since admission. Appears to be near euvolemia. Has been on lasix 40mg  qd. She is still with orthopnea and LE edema. I will give her 40mg  IV lasix today. Her creat is normal.   3. Acute Encephalopathy: seems to be sundowning. Dig level 1.0. Now  resolved. Daughter very concerned with this. Asking for med lists, dig levels and re-read of recent head CT. I reviewed her last CT report which has no acute abnormalities.   4. Cardiomyopathy: Presumed to be related to atrial fib. She will need ischemic evaluation if no improvement in LVEF after rate control.   5. S/p MVR: on coumadin with goal INR 2.5-3.5  Janetta Hora  2/25/20168:07 AM  I have personally seen and examined this patient with Cline Crock, PA-C. I agree with the assessment and plan as outlined above. I have spent 30 minutes in the room with the patient and her daughter today. Her daughter is upset about her mothers hospital course and ongoing confusion. The patient is oriented to person and  place today but still have episodes of rambling with vague conversation about events from the past. As noted before, no evidence of acute CVA on CT head 06/02/14. Very little neurological improvement over last 72 hours. Unclear the reason for her confusion. There is obviously high probability of an embolic event with LAA thrombus noted on TEE. She is therapeutic on coumadin today but she had been given Lovenox yesterday with INR of 2.1 (goal 2.5-3.5 with mechanical MVR in place). Her daughter is quite concerned about the Lovenox shots which have now been stopped. Mild volume overload. One dose IV Lasix today. Atrial fib is rate controlled. Coumadin is therapeutic. Cannot cardiovert due to LAA thrombus.  -Will ask Neurology to see with ongoing confusion.   Kyshaun Barnette 06/06/2014 1:06 PM

## 2014-06-07 DIAGNOSIS — I639 Cerebral infarction, unspecified: Secondary | ICD-10-CM

## 2014-06-07 DIAGNOSIS — R4182 Altered mental status, unspecified: Secondary | ICD-10-CM | POA: Insufficient documentation

## 2014-06-07 DIAGNOSIS — R410 Disorientation, unspecified: Secondary | ICD-10-CM

## 2014-06-07 LAB — AMMONIA: Ammonia: <9 umol/L — ABNORMAL LOW (ref 11–32)

## 2014-06-07 LAB — PROTIME-INR
INR: 3.79 — ABNORMAL HIGH (ref 0.00–1.49)
Prothrombin Time: 37.7 seconds — ABNORMAL HIGH (ref 11.6–15.2)

## 2014-06-07 MED ORDER — WARFARIN SODIUM 10 MG PO TABS
10.0000 mg | ORAL_TABLET | ORAL | Status: DC
  Filled 2014-06-07: qty 1

## 2014-06-07 MED ORDER — ENSURE COMPLETE PO LIQD
237.0000 mL | Freq: Three times a day (TID) | ORAL | Status: DC
  Administered 2014-06-07 – 2014-06-11 (×10): 237 mL via ORAL

## 2014-06-07 MED ORDER — WARFARIN SODIUM 5 MG PO TABS
5.0000 mg | ORAL_TABLET | ORAL | Status: DC
Start: 2014-06-07 — End: 2014-06-08
  Administered 2014-06-07: 5 mg via ORAL
  Filled 2014-06-07: qty 1

## 2014-06-07 NOTE — Evaluation (Signed)
Speech Language Pathology Evaluation Patient Details Name: Rachel Cantu MRN: [id number] DOB: March 01, 1951 Today's Date: 06/07/2014 Time: 1345-1445 SLP Time Calculation (min) (ACUTE ONLY): 60 min  Problem List:  Patient Active Problem List   Diagnosis Date Noted  . Mental status change   . Acute systolic CHF (congestive heart failure), NYHA class 3 05/30/2014  . Renal failure   . Atrial fibrillation with RVR 05/29/2014  . S/P MVR (mitral valve replacement) 05/29/2014  . Acute diastolic CHF (congestive heart failure), NYHA class 3 05/29/2014  . Chronic anticoagulation 05/29/2014  . Atrial fibrillation with rapid ventricular response   . Renal insufficiency   . Stress 11/18/2011  . Adequate anticoagulation on anticoagulant therapy 11/17/2011  . Anticoagulated on Coumadin 11/17/2011  . Irregular menses 11/17/2011  . Mechanical heart valve present 05/15/2010  . HYPERLIPIDEMIA 10/11/2008  . MITRAL VALVE DISORDERS 02/13/2008  . STATUS, HEART VALVE REPLACEMENT NEC 11/09/2006  . HYPERTENSION 09/26/2006  . RHEUMATIC FEVER, HX OF 09/26/2006   Past Medical History:  Past Medical History  Diagnosis Date  . Hypertension   . History of chickenpox   . S/P MVR (mitral valve replacement) 1994  . Rheumatic fever     as a child  . Heart murmur   . Atrial fibrillation with RVR 05/29/2014    Hattie Perch/notes 05/29/2014   Past Surgical History:  Past Surgical History  Procedure Laterality Date  . Mitral valve replacement  1994  . Tee without cardioversion  05/30/2014  . Tee without cardioversion N/A 05/30/2014    Procedure: TRANSESOPHAGEAL ECHOCARDIOGRAM (TEE);  Surgeon: Lewayne BuntingBrian S Crenshaw, MD;  [facility];  Service: Cardiovascular;  Laterality: N/A;   HPI:  64 y.o. female w/ PMHx significant for rheumatic mitral dz s/p mech MVR, HTN who [facility] with complaints of 7 days of shortness of breath and palpitations. 64 year old female with altered mental  status. Remaining neurological examination is non-focal. Showering of emboli remains on the differential and at this point is likely per MD. Can not MRI.    Assessment / Plan / Recommendation Clinical Impression  Pt demonstrates moderate cognitive impairment including limited sustained attention to basic functional tasks, decreased long term memory and confabulation. Redirection most effective, reducing environmental disctraction to focus on purposeful tasks. Pt would benefit from CIR at d/c. SLP will continue to work with pt.     SLP Assessment  Patient needs continued Speech Lanaguage Pathology Services    Follow Up Recommendations  Inpatient Rehab    Frequency and Duration min 2x/week  2 weeks   Pertinent Vitals/Pain Pain Assessment: No/denies pain   SLP Goals  Progression toward goals: Progressing toward goals Potential to Achieve Goals (ACUTE ONLY): Fair  SLP Evaluation Prior Functioning  Cognitive/Linguistic Baseline: Within functional limits Type of Home: House  Lives With: Spouse Available Help at Discharge: Family   Cognition  Overall Cognitive Status: Impaired/Different from baseline Arousal/Alertness: Awake/alert Orientation Level: Oriented to person;Disoriented to place;Disoriented to situation Attention: Focused;Sustained Focused Attention: Impaired Focused Attention Impairment: Functional basic;Verbal complex Sustained Attention: Impaired Sustained Attention Impairment: Verbal basic;Functional basic Memory: Impaired Memory Impairment: Decreased long term memory;Decreased short term memory Decreased Long Term Memory: Verbal basic Decreased Short Term Memory: Verbal complex Awareness: Impaired Awareness Impairment: Intellectual impairment;Emergent impairment Problem Solving: Impaired Problem Solving Impairment: Verbal complex;Functional basic Behaviors: Confabulation    Comprehension  Auditory Comprehension Overall Auditory Comprehension: Appears within  functional limits for tasks assessed Yes/No Questions: Within Functional Limits    Expression  Verbal Expression Overall Verbal Expression: Appears within functional limits for tasks assessed Interfering Components: Attention   Oral / Motor Oral Motor/Sensory Function Overall Oral Motor/Sensory Function: Appears within functional limits for tasks assessed Motor Speech Overall Motor Speech: Appears within functional limits for tasks assessed   GO    Harlon Ditty, MA CCC-SLP [phone]   Claudine Mouton 06/07/2014, 3:02 PM

## 2014-06-07 NOTE — Progress Notes (Signed)
SUBJECTIVE: Pt has no complaints.   BP 119/91 mmHg  Pulse 75  Temp(Src) 97.8 F (36.6 C) (Oral)  Resp 18  Ht  (1.626 m)  Wt 162 lb 14.4 oz (73.891 kg)  BMI 27.95 kg/m2  SpO2 99%  Intake/Output Summary (Last 24 hours) at 06/07/14 1043 Last data filed at 06/07/14 0950  Gross per 24 hour  Intake    410 ml  Output    651 ml  Net   -241 ml    PHYSICAL EXAM General: Well developed, well nourished, in no acute distress. Alert and oriented x 3.  Psych:  Good affect, responds appropriately Neck: No JVD. No masses noted.  Lungs: Clear bilaterally with no wheezes or rhonci noted.  Heart: RRR with no murmurs noted. Abdomen: Bowel sounds are present. Soft, non-tender.  Extremities: Trace bilateral lower extremity edema.   LABS: Basic Metabolic Panel:  Recent Labs  16/10/96 0410 06/06/14 2049  NA 136 137  K 4.0 4.4  CL 101 101  CO2 22 21  GLUCOSE 116* 132*  BUN 19 21  CREATININE 0.85 0.88  CALCIUM 8.9 9.0   CBC:  Recent Labs  06/05/14 0500  WBC 8.0  HGB 14.0  HCT 42.4  MCV 92.8  PLT 169   Current Meds: . furosemide  40 mg Oral Daily  . metoprolol tartrate  75 mg Oral BID  . pantoprazole  40 mg Oral Daily  . [MAR Hold] pneumococcal 23 valent vaccine  0.5 mL Intramuscular Tomorrow-1000  . potassium chloride  40 mEq Oral Daily  . sodium chloride  3 mL Intravenous Q12H  . Warfarin - Pharmacist Dosing Inpatient   Does not apply q1800     ASSESSMENT AND PLAN: 64 y/o F w/ PMHx of rheumatic heart disease s/p mechanical MVR (1995), HTN, admitted on 05/29/14 w/ atrial fibrillation w/ RVR, also found to have LA thrombus. Patient developed respiratory distress on 06/02/14 which improved w/ BiPAP. Transferred to tele from CCU on 06/04/14 with ongoing altered mental status (oriented to person and place but has tangential thoughts).   1. Atrial Fibrillation with RVR: Rate controlled on metoprolol  BID. Will stop digoxin. Patients CHA2DS2-VASc Score and unadjusted  Ischemic Stroke Rate (% per year) is equal to 4.8 % stroke rate/year from a score of 4. TEE on 05/30/14 showed large LAA thrombus, no DCCV performed at that time. Had previously been on cardizem gtt and changed to po, however, this was discontinued due to low EF (20-25%). Also not candidate for Amiodarone given risk of CVA w/ LA thrombus w/ chemical cardioversion. Continue Coumadin per pharmacy; INR currently 3.79 this AM  2. Acute on Chronic Systolic CHF: EF (20-25%). She has diuresed well. Net 8.9 Liters since admission. Mild volume overload on 06/06/14 and given 40 mg IV Lasix. Appears to be near euvolemic today. Will resume Lasix  qd.    3. Acute Encephalopathy: Pt not at baseline per daughter. Still having confusion, slow thoughts and lack of emotion. Neurology saw her yesterday and cannot exclude the possibility that the mental status change may be secondary to a shower of emboli. Cannot get MRI due to mechanical mitral valve. Ammonia level ok. LFTs ok. CT head repeated on 06/06/14 and no acute abnormalities noted. I have had a 45 minute conversation with her daughter this am in the presence of Dr. Thad Ranger. Her daughter continues to be upset about the care given to her mother over the last week. She has concerns that the  mental status changes have not been addressed appropriately. The mental status changes are likely not going to improve significantly per Neurology if due to shower emboli. The patient has no appetite. Will ask for Speech Therapy consult for evaluation of swallowing. Will ask RN to document meal portions that are eaten. Will ask Dietician to evaluate.   4. Cardiomyopathy: Presumed to be related to atrial fib. She will need ischemic evaluation if no improvement in LVEF after rate control.   5. S/p MVR: on coumadin with goal INR 2.5-3.5      MCALHANY,CHRISTOPHER  2/26/201610:43 AM

## 2014-06-07 NOTE — Progress Notes (Signed)
Physical Therapy Treatment Patient Details Name: Rachel Cantu MRN: [id number] DOB: 30-Dec-1950 Today's Date: 06/07/2014    History of Present Illness 64 y.o. female w/ PMHx significant for rheumatic mitral dz s/p mech MVR, HTN who [facility] with complaints of 7 days of shortness of breath and palpitations. She presents with her husband. She is a rather difficult historian to follow and answers questions repetitively and tangentially goes off.  She was admitted with a dx of a fib with RVR.    PT Comments    Pt demonstrated decreased attention, initiation, problem solving and processing. Pt needed multiple cues to redirect to question or motor command. Pt had some word finding difficulty during conversation. Pt was able to stand up with min assist to initiate movement and was able to walk to the foot of the bed with max cues to manage the RW and to take steps. Pt had poor awareness of safety and attempted to lean forward on the RW and shut her eyes. Pt redirected and was able to walk to the bed without the RW. Pt would benefit from continued skilled PT to improve safety and mobility. Will monitor cognitive status and will adjust d/c plan as appropriate.   Follow Up Recommendations  Home health PT;Supervision/Assistance - 24 hour     Equipment Recommendations  None recommended by PT    Recommendations for Other Services Speech consult     Precautions / Restrictions Precautions Precautions: Fall Restrictions Weight Bearing Restrictions: No    Mobility  Bed Mobility Overal bed mobility: Needs Assistance Bed Mobility: Sit to Supine       Sit to supine: Min assist   General bed mobility comments: tactile cues to initiate movement  Transfers Overall transfer level: Needs assistance Equipment used: None Transfers: Sit to/from Stand Sit to Stand: Min assist         General transfer comment: min assist to guide and initiate movement.    Ambulation/Gait Ambulation/Gait assistance: Min assist Ambulation Distance (Feet): 6 Feet Assistive device: Rolling walker (2 wheeled) Gait Pattern/deviations: Step-to pattern;Decreased stride length Gait velocity: decreased   General Gait Details: Pt had difficulty sequencing with a RW and needed max cues to take a step. Therapist had to block and guide the RW for safety. Pt walked to the foot of the bed and then started to lean forward on the walker and close her eyes.  Pt redirected to stand up and walk to the bed with min assist.    Stairs            Wheelchair Mobility    Modified Rankin (Stroke Patients Only)       Balance Overall balance assessment: Needs assistance         Standing balance support: Bilateral upper extremity supported Standing balance-Leahy Scale: Poor                      Cognition Arousal/Alertness: Awake/alert Behavior During Therapy: WFL for tasks assessed/performed Overall Cognitive Status: Impaired/Different from baseline Area of Impairment: Attention;Following commands;Safety/judgement;Problem solving   Current Attention Level: Sustained   Following Commands: Follows one step commands inconsistently Safety/Judgement: Decreased awareness of safety   Problem Solving: Decreased initiation General Comments: Pt requires multiple cues to stay focused and on topic. Pt had some difficulty with word finding. I spent over 30 mins. with the pt and daughter discussing history and current status. Pt's RN came in to administer medications. Pt was distracted and unable  to focus to task at hand and for time management I needed to leave the patient and come back at a later time to complete my treatment.     Exercises      General Comments General comments (skin integrity, edema, etc.): Pt was a little inconsistent with findings. Pt had difficulty following commands needing visual demonstration cues. Pt had a gaze preference to the right  side. Pt did independently reach for a cup on her left side, but when given a target to look at and reach for she hesitated and could not cross over midline.      Pertinent Vitals/Pain Pain Assessment: No/denies pain    Home Living                      Prior Function            PT Goals (current goals can now be found in the care plan section) Progress towards PT goals: PT to reassess next treatment    Frequency  Min 3X/week    PT Plan Other (comment);Current plan remains appropriate    Co-evaluation             End of Session Equipment Utilized During Treatment: Gait belt Activity Tolerance: Patient limited by fatigue Patient left: in bed;with call bell/phone within reach;with bed alarm set;with family/visitor present     Time: 1212-1259 PT Time Calculation (min) (ACUTE ONLY): 47 min  Charges:  $Gait Training: 8-22 mins $Self Care/Home Management: 23-37                    G Codes:      Greggory Stallion 06/07/2014, 1:04 PM

## 2014-06-07 NOTE — Clinical Social Work Placement (Cosign Needed)
Clinical Social Work Department CLINICAL SOCIAL WORK PLACEMENT NOTE 06/07/2014  Patient:  Rachel DumasHOLMON,Bernell E  Account Number:  [id number] Admit date:  05/28/2014  Clinical Social Worker:  Corlis HoveJENEYA Paulla Mcclaskey, CLINICAL SOCIAL WORKER  Date/time:  06/07/2014 10:26 PM  Clinical Social Work is seeking post-discharge placement for this patient at the following level of care:   SKILLED NURSING   (*CSW will update this form in Epic as items are completed)   06/07/2014  Patient/family provided with [facility] Department of Clinical Social Work's list of facilities offering this level of care within the geographic area requested by the patient (or if unable, by the patient's family).  06/07/2014  Patient/family informed of their freedom to choose among providers that offer the needed level of care, that participate in Medicare, Medicaid or managed care program needed by the patient, have an available bed and are willing to accept the patient.  06/07/2014  Patient/family informed of [facility]' ownership interest in [facility], as well as of the fact that they are under no obligation to receive care at this facility.  PASARR submitted to EDS on 06/07/2014 PASARR number received on 06/07/2014  FL2 transmitted to all facilities in geographic area requested by pt/family on  06/07/2014 FL2 transmitted to all facilities within larger geographic area on 06/07/2014  Patient informed that his/her managed care company has contracts with or will negotiate with  certain facilities, including the following:     Patient/family informed of bed offers received:   Patient chooses bed at  Physician recommends and patient chooses bed at    Patient to be transferred to  on   Patient to be transferred to facility by  Patient and family notified of transfer on  Name of family member notified:    The following physician request were entered in Epic:   Additional Comments:

## 2014-06-07 NOTE — Evaluation (Signed)
Clinical/Bedside Swallow Evaluation Patient Details  Name: Rachel Cantu MRN: [id number] Date of Birth: [birth year]  Today's Date: 06/07/2014 Time: SLP Start Time (ACUTE ONLY): 1345 SLP Stop Time (ACUTE ONLY): 1445 SLP Time Calculation (min) (ACUTE ONLY): 60 min  Past Medical History:  Past Medical History  Diagnosis Date  . Hypertension   . History of chickenpox   . S/P MVR (mitral valve replacement) 1994  . Rheumatic fever     as a child  . Heart murmur   . Atrial fibrillation with RVR 05/29/2014    Hattie Perch 05/29/2014   Past Surgical History:  Past Surgical History  Procedure Laterality Date  . Mitral valve replacement  1994  . Tee without cardioversion  05/30/2014  . Tee without cardioversion N/A 05/30/2014    Procedure: TRANSESOPHAGEAL ECHOCARDIOGRAM (TEE);  Surgeon: Lewayne Bunting, MD;  Location: [facility] ENDOSCOPY;  Service: Cardiovascular;  Laterality: N/A;   HPI:  64 y.o. female w/ PMHx significant for rheumatic mitral dz s/p mech MVR, HTN who presented to [facility] on 05/29/2014 with complaints of 7 days of shortness of breath and palpitations. 65 year old female with altered mental status. Remaining neurological examination is non-focal. Showering of emboli remains on the differential and at this point is likely per MD. Can not MRI.    Assessment / Plan / Recommendation Clinical Impression  Pt demonstrates adequate ability to swallow and masticate, though cognitive function impedes adequate PO intake. Pt is easily distracted, confabulatory and resistant to cues. Reducing visual distraction and providing assistance for pt to self feed with family out of room were helpful to increase intake by a minimal amount. Proceeded to cognitive linguistic eval as treatment will be in this area, rather than with swallowing. Recommend pt continue current diet with most palatable foods allowed (suggest MD allow family to bring outside foods). Provided further suggestions to family and  staff.     Aspiration Risk  Mild    Diet Recommendation Regular;Thin liquid   Liquid Administration via: Cup;Straw Medication Administration: Whole meds with liquid Supervision: Patient able to self feed;Staff to assist with self feeding Compensations: Slow rate;Small sips/bites Postural Changes and/or Swallow Maneuvers: Seated upright 90 degrees;Out of bed for meals    Other  Recommendations     Follow Up Recommendations  Inpatient Rehab    Frequency and Duration min 2x/week  2 weeks   Pertinent Vitals/Pain NA    SLP Swallow Goals     Swallow Study Prior Functional Status       General HPI: 64 y.o. female w/ PMHx significant for rheumatic mitral dz s/p mech MVR, HTN who presented to [facility] on 05/29/2014 with complaints of 7 days of shortness of breath and palpitations. 64 year old female with altered mental status. Remaining neurological examination is non-focal. Showering of emboli remains on the differential and at this point is likely per MD. Can not MRI.  Type of Study: Bedside swallow evaluation Previous Swallow Assessment: none Diet Prior to this Study: Regular;Thin liquids Temperature Spikes Noted: No Respiratory Status: Room air History of Recent Intubation: No Behavior/Cognition: Alert;Cooperative;Confused;Distractible Oral Cavity - Dentition: Adequate natural dentition Self-Feeding Abilities: Able to feed self Patient Positioning: Upright in bed Baseline Vocal Quality: Clear Volitional Cough: Strong Volitional Swallow: Able to elicit    Oral/Motor/Sensory Function Overall Oral Motor/Sensory Function: Appears within functional limits for tasks assessed   Ice Chips     Thin Liquid Thin Liquid: Within functional limits Presentation: Cup;Straw    Nectar Thick  Nectar Thick Liquid: Not tested   Honey Thick Honey Thick Liquid: Not tested   Puree Puree: Within functional limits   Solid   GO    Solid: Impaired Presentation: Self Fed Oral  Phase Impairments: Impaired mastication (prolonged mastication)      Harlon DittyBonnie Mileidy Atkin, MA CCC-SLP [phone]  Grazia Taffe, Riley NearingBonnie Caroline 06/07/2014,2:56 PM

## 2014-06-07 NOTE — Progress Notes (Signed)
Sat with pt for 30+ min to feed dinner. Pt consumed about 40% with me redirecting and bringing food to mouth without accepting excuse that she was "done". She would continue to eat when I brought the food to her mouth to eat. Family came and I showed them the food she consumed.

## 2014-06-07 NOTE — Progress Notes (Signed)
INITIAL NUTRITION ASSESSMENT  DOCUMENTATION CODES Per approved criteria  -Not Applicable   INTERVENTION: Provide Ensure Enlive TID in between meals , each supplement provides 350 kcal and 20 grams of protein Provide Magic Cup ice cream BID with meals, each supplement provides 290 kcal and 13 grams of protein Encourage PO intake  NUTRITION DIAGNOSIS: Inadequate oral intake related to diarrhea and poor appetite as evidenced by meal refusal x 2 days.   Goal: Pt to meet >/= 90% of their estimated nutrition needs   Monitor:  PO intake, supplement acceptance, weight trend, labs, I/O's  Reason for Assessment: Consult for Poor PO intake/Assessment of Nutrition Status  64 y.o. female  Admitting Dx: <principal problem not specified>  ASSESSMENT: 64 y/o F w/ PMHx of rheumatic heart disease s/p mechanical MVR, HTN, admitted on 05/29/14 w/ atrial fibrillation w/ RVR, also found to have LA thrombus.  RD met with patient a couple days ago at which time patient reported having a good appetite, eating well, and maintaining her weight around 157 lbs. Per nursing notes pt was eating 50-100% of all meals 2/21 to 2/23. For the past 2 days patient has refused most meals- she ate 25% of one meal.  Pt is very difficulty to obtain information from- she gives a lot of analogies and examples that don't seem to make sense. She seems to related her poor PO intake to her medications and diarrhea. She states she has a chocolate Boost the other day which she liked and reports tolerating well.  RD brought patient a chocolate Ensure Enlive which pt took 2 sips of and said she liked. RD encouraged pt to eat; emphasized the importance of getting adequate nutrition.   -8.9 L fluid balance  Labs reviewed.   Height: Ht Readings from Last 1 Encounters:  06/02/14 '5\' 4"'  (1.626 m)    Weight: Wt Readings from Last 1 Encounters:  06/07/14 162 lb 14.4 oz (73.891 kg)  06/05/14 163 lb  Ideal Body Weight: 120 lb  %  Ideal Body Weight: 135%  Wt Readings from Last 10 Encounters:  06/07/14 162 lb 14.4 oz (73.891 kg)  01/09/13 179 lb (81.194 kg)  01/01/13 180 lb (81.647 kg)  12/18/12 182 lb (82.555 kg)  01/05/12 177 lb (80.287 kg)  11/17/11 180 lb (81.647 kg)  12/01/09 180 lb (81.647 kg)  10/11/08 174 lb (78.926 kg)  06/02/07 179 lb (81.194 kg)  11/15/05 182 lb (82.555 kg)  05/29/14 168 lb  Usual Body Weight: 157 lb  % Usual Body Weight: 103%  BMI:  Body mass index is 27.95 kg/(m^2).  Estimated Nutritional Needs: Kcal: 1600-1800 Protein: 70-85 grams Fluid: 1.8 L/day  Skin: +1 generalized edema; +2 RLE and LLE edema  Diet Order: Diet Heart  EDUCATION NEEDS: -No education needs identified at this time   Intake/Output Summary (Last 24 hours) at 06/07/14 1123 Last data filed at 06/07/14 0950  Gross per 24 hour  Intake    410 ml  Output    651 ml  Net   -241 ml    Last BM: 2/25   Labs:   Recent Labs Lab 06/04/14 0305 06/06/14 0410 06/06/14 2049  NA 137 136 137  K 3.5 4.0 4.4  CL 101 101 101  CO2 '28 22 21  ' BUN '16 19 21  ' CREATININE 0.90 0.85 0.88  CALCIUM 8.5 8.9 9.0  GLUCOSE 133* 116* 132*    CBG (last 3)   Recent Labs  06/05/14 0621  GLUCAP 122*  Scheduled Meds: . furosemide  40 mg Oral Daily  . metoprolol tartrate  75 mg Oral BID  . pantoprazole  40 mg Oral Daily  . [MAR Hold] pneumococcal 23 valent vaccine  0.5 mL Intramuscular Tomorrow-1000  . potassium chloride  40 mEq Oral Daily  . sodium chloride  3 mL Intravenous Q12H  . [START ON 06/08/2014] warfarin  10 mg Oral Q48H  . warfarin  5 mg Oral Q48H  . Warfarin - Pharmacist Dosing Inpatient   Does not apply q1800    Continuous Infusions: . sodium chloride Stopped (06/03/14 1000)    Past Medical History  Diagnosis Date  . Hypertension   . History of chickenpox   . S/P MVR (mitral valve replacement) 1994  . Rheumatic fever     as a child  . Heart murmur   . Atrial fibrillation with RVR  05/29/2014    Archie Endo 05/29/2014    Past Surgical History  Procedure Laterality Date  . Mitral valve replacement  1994  . Tee without cardioversion  05/30/2014  . Tee without cardioversion N/A 05/30/2014    Procedure: TRANSESOPHAGEAL ECHOCARDIOGRAM (TEE);  Surgeon: Lelon Perla, MD;  Location: [address];  Service: Cardiovascular;  Laterality: N/A;    Pryor Ochoa RD, LDN Inpatient Clinical Dietitian Pager: [phone] After Hours Pager: [phone]

## 2014-06-07 NOTE — Progress Notes (Signed)
ANTICOAGULATION CONSULT NOTE - Follow Up Consult  Pharmacy Consult for Coumadin Indication: mech MVR  Allergies  Allergen Reactions  . Sulfamethoxazole     REACTION: unspecified    Patient Measurements: Height: 5\' 4"  (162.6 cm) Weight: 162 lb 14.4 oz (73.891 kg) (scale B) IBW/kg (Calculated) : 54.7 Heparin Dosing Weight: 71 kg  Vital Signs: Temp: 97.8 F (36.6 C) (02/26 0510) Temp Source: Oral (02/26 0510) BP: 119/91 mmHg (02/26 1009) Pulse Rate: 75 (02/26 1009)  Labs:  Recent Labs  06/05/14 0500 06/06/14 0410 06/06/14 2049 06/07/14 0417  HGB 14.0  --   --   --   HCT 42.4  --   --   --   PLT 169  --   --   --   LABPROT 24.4* 29.9*  --  37.7*  INR 2.18* 2.82*  --  3.79*  CREATININE  --  0.85 0.88  --     Estimated Creatinine Clearance: 62.8 mL/min (by C-G formula based on Cr of 0.88).   Assessment: H63 YOF with mech MVR on warf PTA admitted 05/28/2014 presenting with AFib/RVR.  PMH: Mech MVR, HTN, new afib,  Coag/Heme: Warfarin PTA for mechanical MVR; now new Afib, TEE shows large thrombus in LAA, DCCV was not done on 2/18. Warfarin resumed on 2/18. Patient on heparin 2/17-2/22. LMWH 2/24 for INR<2.5. INR  1.8>>2.8>>3.84>>2.62>2.18>2.82>3.79 today really bouncing around. CBC Stable. Plts improved 169. -PTA warfarin dose = alternates 5 mg and 10 mg (Admit INR 2.86)  CV: BP 119/91, Brady improved to 75, HF EF 20-25%, afib (CHADS VASC 4). Meds: digoxin 0.25 (CrCl 62, K=4.4), po furosemide, metoprolol, K40, - 2/24: Digoxin level =1.  Endo: TSH nml  GI: AST and ALT slightly elevated along with TBili of 2.2 on 2/16. None since.  Neuro: sundowning and delirium since staring digoxin? NH4 normal. Neuro consulted. CT 2/21 with no acute changes. Repeat head CT  Renal: AKI- holding ARB. SCr 2.52>0.88  Pulm: RA  PTA meds: Benicar HCT    Goal of Therapy:  INR 2.5-3.5 Monitor platelets by anticoagulation protocol: Yes   Plan:  Coumadin 5mg  and 10mg   alternating. Daily INR   Rachel Cantu, PharmD, BCPS Clinical Staff Pharmacist Pager [phone]  Pasty Spillersobertson, Rachel Cantu 06/07/2014,10:32 AM

## 2014-06-07 NOTE — Progress Notes (Signed)
Subjective: Patient has no complaints.  Mental status unchanged.    Objective: Current vital signs: BP 119/91 mmHg  Pulse 75  Temp(Src) 97.8 F (36.6 C) (Oral)  Resp 18  Ht '5\' 4"'  (1.626 m)  Wt 73.891 kg (162 lb 14.4 oz)  BMI 27.95 kg/m2  SpO2 99% Vital signs in last 24 hours: Temp:  [97.8 F (36.6 C)-98.1 F (36.7 C)] 97.8 F (36.6 C) (02/26 0510) Pulse Rate:  [75-100] 75 (02/26 1009) Resp:  [18] 18 (02/26 0510) BP: (107-119)/(73-91) 119/91 mmHg (02/26 1009) SpO2:  [99 %] 99 % (02/26 0510) Weight:  [73.891 kg (162 lb 14.4 oz)] 73.891 kg (162 lb 14.4 oz) (02/26 0510)  Intake/Output from previous day: 02/25 0701 - 02/26 0700 In: 600 [P.O.:600] Out: 551 [Urine:551] Intake/Output this shift: Total I/O In: 50 [P.O.:50] Out: 100 [Urine:100] Nutritional status: Diet Heart  Neurologic Exam: General: Mental Status: Alert, oriented to year, month and hospital but if continues to talk will become tangential and show word substitution. Thought content not appropriate for discussion at times. Speech fluent.  Able to follow 3 step commands without difficulty today. Cranial Nerves: II: Visual fields grossly normal, pupils equal, round, reactive to light and accommodation III,IV, VI: ptosis not present, extra-ocular motions intact bilaterally V,VII: smile symmetric, facial light touch sensation normal bilaterally VIII: hearing normal bilaterally IX,X: gag reflex present XI: bilateral shoulder shrug XII: midline tongue extension without atrophy or fasciculations  Motor: 4/5 throughout Sensory: Pinprick and light touch intact throughout, bilaterally.  Deep Tendon Reflexes:  Right: Upper Extremity   Left: Upper extremity   biceps (C-5 to C-6) 2/4   biceps (C-5 to C-6) 2/4 tricep (C7) 2/4    triceps (C7) 2/4 Brachioradialis (C6) 2/4  Brachioradialis (C6) 2/4  Lower Extremity Lower Extremity  quadriceps (L-2 to L-4) 2/4   quadriceps (L-2 to L-4) 2/4 Achilles (S1) 2/4   Achilles  (S1) 2/4  Plantars: Right: downgoing   Left: downgoing Cerebellar: normal finger-to-nose,  normal heel-to-shin test    Lab Results: Basic Metabolic Panel:  Recent Labs Lab 06/02/14 1508 06/03/14 0240 06/04/14 0305 06/06/14 0410 06/06/14 2049  NA 137 138 137 136 137  K 3.9 4.1 3.5 4.0 4.4  CL 107 106 101 101 101  CO2 '23 26 28 22 21  ' GLUCOSE 131* 105* 133* 116* 132*  BUN '16 16 16 19 21  ' CREATININE 0.94 0.98 0.90 0.85 0.88  CALCIUM 8.9 8.7 8.5 8.9 9.0    Liver Function Tests:  Recent Labs Lab 06/06/14 2049  AST 31  ALT 36*  ALKPHOS 134*  BILITOT 1.3*  PROT 6.1  ALBUMIN 3.1*   No results for input(s): LIPASE, AMYLASE in the last 168 hours.  Recent Labs Lab 06/07/14 0800  AMMONIA <9*    CBC:  Recent Labs Lab 06/01/14 0325 06/02/14 0430 06/03/14 0246 06/04/14 0305 06/05/14 0500  WBC 7.5 7.8 7.9 7.0 8.0  HGB 14.4 14.5 14.1 13.6 14.0  HCT 43.4 43.5 42.6 41.1 42.4  MCV 92.7 92.6 92.4 92.8 92.8  PLT 132* 139* 128* 140* 169    Cardiac Enzymes: No results for input(s): CKTOTAL, CKMB, CKMBINDEX, TROPONINI in the last 168 hours.  Lipid Panel: No results for input(s): CHOL, TRIG, HDL, CHOLHDL, VLDL, LDLCALC in the last 168 hours.  CBG:  Recent Labs Lab 06/02/14 2046 06/05/14 0621  GLUCAP 116* 122*    Microbiology: Results for orders placed or performed during the hospital encounter of 05/28/14  MRSA PCR Screening  Status: None   Collection Time: 05/29/14  4:15 AM  Result Value Ref Range Status   MRSA by PCR NEGATIVE NEGATIVE Final    Comment:        The GeneXpert MRSA Assay (FDA approved for NASAL specimens only), is one component of a comprehensive MRSA colonization surveillance program. It is not intended to diagnose MRSA infection nor to guide or monitor treatment for MRSA infections.   MRSA PCR Screening     Status: None   Collection Time: 06/02/14  2:02 PM  Result Value Ref Range Status   MRSA by PCR NEGATIVE NEGATIVE Final     Comment:        The GeneXpert MRSA Assay (FDA approved for NASAL specimens only), is one component of a comprehensive MRSA colonization surveillance program. It is not intended to diagnose MRSA infection nor to guide or monitor treatment for MRSA infections.     Coagulation Studies:  Recent Labs  06/05/14 0500 06/06/14 0410 06/07/14 0417  LABPROT 24.4* 29.9* 37.7*  INR 2.18* 2.82* 3.79*    Imaging: Ct Head Wo Contrast  06/06/2014   CLINICAL DATA:  64 year old female with altered mental status, confusion. Initial encounter.  EXAM: CT HEAD WITHOUT CONTRAST  TECHNIQUE: Contiguous axial images were obtained from the base of the skull through the vertex without intravenous contrast.  COMPARISON:  Head CT without contrast 06/02/2014.  FINDINGS: Visualized paranasal sinuses and mastoids are clear. No acute osseous abnormality identified. Dysconjugate gaze, otherwise negative orbits soft tissues.  Negative scalp soft tissues except for superficial soft tissue irregularity at the left vertex seen on series 4, image 65.  Calcified atherosclerosis at the skull base. Stable right greater than left PCA territory encephalomalacia which appears to be chronic. Small chronic appearing cerebellar lacunar infarcts re- identified. Moderate patchy white matter hypodensity elsewhere is nonspecific. No ventriculomegaly. No midline shift, mass effect, or evidence of intracranial mass lesion. No suspicious intracranial vascular hyperdensity. No acute cortically based infarct identified. No acute intracranial hemorrhage identified.  IMPRESSION: 1.  No acute intracranial abnormality identified. 2. Stable and chronic appearing bilateral PCA and cerebellar ischemia. 3. Superficial/dermal scalp soft tissue lesion at the left vertex, see series 4, image 65.   Electronically Signed   By: Genevie Ann M.D.   On: 06/06/2014 19:41    Medications:  Scheduled: . furosemide  40 mg Oral Daily  . metoprolol tartrate  75 mg  Oral BID  . pantoprazole  40 mg Oral Daily  . [MAR Hold] pneumococcal 23 valent vaccine  0.5 mL Intramuscular Tomorrow-1000  . potassium chloride  40 mEq Oral Daily  . sodium chloride  3 mL Intravenous Q12H  . [START ON 06/08/2014] warfarin  10 mg Oral Q48H  . warfarin  5 mg Oral Q48H  . Warfarin - Pharmacist Dosing Inpatient   Does not apply q1800   Etta Quill PA-C [facility] [phone]  06/07/2014, 10:52 AM  Patient seen and examined.  Clinical course and management discussed.  Necessary edits performed.  I agree with the above.  Assessment and plan of care developed and discussed below.   Assessment/Plan: 64 year old female with altered mental status. Remaining neurological examination is non-focal. Patient admitted with atrial fibrillation. INR subtherapeutic at that time. Echocardiogram reveals an atrial thrombus and depressed LV function. Etiology for mental status unclear.  Digoxin level normal at 1.0. Ammonia and ESR WNL.  Liver functions improved.  Repeat head CT personally reviewed and unchanged.  Showering of emboli remains on the  differential and at this point is likely.  Can not MRI.   30 minutes spent in discussion with cardiology and daughter.        Alexis Goodell, MD [facility] [phone]  06/07/2014  1:33 PM

## 2014-06-07 NOTE — Progress Notes (Signed)
Ordered Ensure given tonight with PM medication. Patient drank very little and continued to hold and pocket pills in her mouth while refusing to swallow the Ensure.  RN continued to encourage patient to swallow what she had in her mouth. Patient refused to swallow and kept both pills and Ensure in her mouth for nearly 10 minutes before swallowing. Patient is currently resting comfortably in bed, family has left and bed alarm is in use. Will continue to monitor. Troy SineWalker, Janzen Sacks M

## 2014-06-08 LAB — BASIC METABOLIC PANEL
Anion gap: 11 (ref 5–15)
BUN: 24 mg/dL — ABNORMAL HIGH (ref 6–23)
CALCIUM: 8.4 mg/dL (ref 8.4–10.5)
CO2: 18 mmol/L — AB (ref 19–32)
Chloride: 106 mmol/L (ref 96–112)
Creatinine, Ser: 0.87 mg/dL (ref 0.50–1.10)
GFR calc Af Amer: 79 mL/min — ABNORMAL LOW (ref >90)
GFR, EST NON AFRICAN AMERICAN: 68 mL/min — AB (ref >90)
Glucose, Bld: 123 mg/dL — ABNORMAL HIGH (ref 70–99)
POTASSIUM: 4.4 mmol/L (ref 3.5–5.1)
Sodium: 135 mmol/L (ref 135–145)

## 2014-06-08 LAB — CBC
HCT: 43.9 % (ref 36.0–46.0)
Hemoglobin: 15 g/dL (ref 12.0–15.0)
MCH: 31.9 pg (ref 26.0–34.0)
MCHC: 34.2 g/dL (ref 30.0–36.0)
MCV: 93.4 fL (ref 78.0–100.0)
Platelets: 173 10*3/uL (ref 150–400)
RBC: 4.7 MIL/uL (ref 3.87–5.11)
RDW: 14.2 % (ref 11.5–15.5)
WBC: 7.2 10*3/uL (ref 4.0–10.5)

## 2014-06-08 LAB — PROTIME-INR
INR: 4.85 — ABNORMAL HIGH (ref 0.00–1.49)
PROTHROMBIN TIME: 45.6 s — AB (ref 11.6–15.2)

## 2014-06-08 NOTE — Progress Notes (Signed)
ANTICOAGULATION CONSULT NOTE - Follow Up Consult  Pharmacy Consult for Coumadin Indication: mech MVR/PAF  Allergies  Allergen Reactions  . Sulfamethoxazole     REACTION: unspecified    Patient Measurements: Height: 5\' 4"  (162.6 cm) Weight: 161 lb 15.2 oz (73.46 kg) (Scale B) IBW/kg (Calculated) : 54.7 Heparin Dosing Weight: 71 kg  Vital Signs: Temp: 98 F (36.7 C) (02/27 0529) Temp Source: Oral (02/27 0529) BP: 115/74 mmHg (02/27 0529) Pulse Rate: 68 (02/27 0529)  Labs:  Recent Labs  06/06/14 0410 06/06/14 2049 06/07/14 0417 06/08/14 0350  HGB  --   --   --  15.0  HCT  --   --   --  43.9  PLT  --   --   --  173  LABPROT 29.9*  --  37.7* 45.6*  INR 2.82*  --  3.79* 4.85*  CREATININE 0.85 0.88  --  0.87    Estimated Creatinine Clearance: 63.3 mL/min (by C-G formula based on Cr of 0.87).   Assessment: [age] YOF with mech MVR on warf PTA admitted 05/28/2014 with AFib/RVR (CHADSVASc 4). TEE with evidence of large LAA thrombus, DCCV canceled on 2/18 and warfarin resumed. INR very labile, today SUPRAtherapeutic at 4.85. CBC remains wnl and stable with no reported significant s/s bleeding.    PTA warfarin dose: alternates 5 mg and 10 mg (Admit INR 2.86); last coumadin clinic note states 7.5 daily except 5 MWF?  Goal of Therapy:  INR 2.5-3.5 Monitor platelets by anticoagulation protocol: Yes   Plan:  - Hold Coumadin tonight with elevated INR - Daily INR - Monitor CBC and for s/s bleeding  Rachel Cantu Rachel Cantu, PharmD, BCPS Clinical Pharmacist - Resident Pager: [phone] Pharmacy: [phone] 06/08/2014 11:25 AM

## 2014-06-08 NOTE — Progress Notes (Signed)
SUBJECTIVE: The patient is doing well today.  She says she feels well today.  She is alert and bright.  I remember Ms Rachel Cantu very well from the care that I gave to her mother several years ago.  The patient has a known affect which is somewhat unusual.  She appears to be cognitively near the baseline that I remember today.  No new focal neuro changes. No bleeding  At this time, she denies chest pain, shortness of breath, or any new concerns.  . feeding supplement (ENSURE COMPLETE)  237 mL Oral TID BM  . furosemide  40 mg Oral Daily  . metoprolol tartrate  75 mg Oral BID  . pantoprazole  40 mg Oral Daily  . [MAR Hold] pneumococcal 23 valent vaccine  0.5 mL Intramuscular Tomorrow-1000  . potassium chloride  40 mEq Oral Daily  . sodium chloride  3 mL Intravenous Q12H  . Warfarin - Pharmacist Dosing Inpatient   Does not apply q1800   . sodium chloride Stopped (06/03/14 1000)    OBJECTIVE: Physical Exam: Filed Vitals:   06/07/14 1009 06/07/14 1431 06/07/14 2027 06/08/14 0529  BP: 119/91 126/97 117/73 115/74  Pulse: 75 60 61 68  Temp:  97.8 F (36.6 C) 98.3 F (36.8 C) 98 F (36.7 C)  TempSrc:  Oral Oral Oral  Resp:  Height:      Weight:    161 lb 15.2 oz (73.46 kg)  SpO2:  94% 99% 100%    Intake/Output Summary (Last 24 hours) at 06/08/14 1314 Last data filed at 06/08/14 0845  Gross per 24 hour  Intake    350 ml  Output    275 ml  Net     75 ml    Telemetry reveals afib  GEN- The patient is well appearing, alert but perseverates and has difficulty providing a directed history (perhaps baseline) Head- normocephalic, atraumatic Eyes-  Sclera clear, conjunctiva pink Ears- hearing intact Oropharynx- clear Neck- supple,   Lungs- Clear to ausculation bilaterally, normal work of breathing Heart- irregular rate and rhythm, mechanical S1 GI- soft, NT, ND, + BS Extremities- no clubbing, cyanosis, or edema Skin- no rash or lesion Psych- euthymic mood, full  affect Neuro- strength and sensation are intact  LABS: Basic Metabolic Panel:  Recent Labs  45/40/98 2049 06/08/14 0350  NA 137 135  K 4.4 4.4  CL 101 106  CO2 21 18*  GLUCOSE 132* 123*  BUN 21 24*  CREATININE 0.88 0.87  CALCIUM 9.0 8.4   Liver Function Tests:  Recent Labs  06/06/14 2049  AST 31  ALT 36*  ALKPHOS 134*  BILITOT 1.3*  PROT 6.1  ALBUMIN 3.1*   No results for input(s): LIPASE, AMYLASE in the last 72 hours. CBC:  Recent Labs  06/08/14 0350  WBC 7.2  HGB 15.0  HCT 43.9  MCV 93.4  PLT 173    ASSESSMENT AND PLAN:   ASSESSMENT AND PLAN: 64 y/o F w/ PMHx of rheumatic heart disease s/p mechanical MVR (1995), HTN, admitted on 05/29/14 w/ atrial fibrillation w/ RVR, also found to have LA thrombus. Patient developed respiratory distress on 06/02/14 which improved w/ BiPAP. Transferred to tele from CCU on 06/04/14 with ongoing altered mental status (oriented to person and place but has tangential thoughts).   1. Atrial Fibrillation with RVR: Rate controlled on metoprolol  BID. Goal INR 2.5-2.5  2. Acute on Chronic Systolic CHF: EF (20-25%).  Appears to be near euvolemic today. Will  resume Lasix 40mg  qd.   3. Acute Encephalopathy:   I think that her tangential thoughts are not as far off of baseline as we have been led to believe.  I remember her well from care that I provided to her mother and find our conversation today very similar to ones that we had in the past.   4. Cardiomyopathy: Presumed to be related to atrial fib. She will need ischemic evaluation if no improvement in LVEF after rate control.   5. S/p MVR: on coumadin with goal INR 2.5-3.5  Hopefully to home in the next 1-2 days   Hillis RangeJames Ladarrius Bogdanski, MD 06/08/2014 1:14 PM

## 2014-06-09 ENCOUNTER — Inpatient Hospital Stay (HOSPITAL_COMMUNITY): Payer: Medicare Other

## 2014-06-09 DIAGNOSIS — R404 Transient alteration of awareness: Secondary | ICD-10-CM

## 2014-06-09 LAB — PROTIME-INR
INR: 2.92 — ABNORMAL HIGH (ref 0.00–1.49)
PROTHROMBIN TIME: 30.7 s — AB (ref 11.6–15.2)

## 2014-06-09 MED ORDER — WARFARIN SODIUM 7.5 MG PO TABS
7.5000 mg | ORAL_TABLET | Freq: Once | ORAL | Status: AC
  Administered 2014-06-09: 7.5 mg via ORAL
  Filled 2014-06-09: qty 1

## 2014-06-09 MED ORDER — METOPROLOL TARTRATE 50 MG PO TABS
50.0000 mg | ORAL_TABLET | Freq: Two times a day (BID) | ORAL | Status: DC
  Administered 2014-06-09 – 2014-06-12 (×5): 50 mg via ORAL
  Filled 2014-06-09 (×7): qty 1

## 2014-06-09 MED ORDER — POTASSIUM CHLORIDE 20 MEQ/15ML (10%) PO SOLN
40.0000 meq | Freq: Every day | ORAL | Status: DC
  Administered 2014-06-09 – 2014-06-12 (×4): 40 meq via ORAL
  Filled 2014-06-09 (×4): qty 30

## 2014-06-09 MED ORDER — SODIUM CHLORIDE 0.9 % IV BOLUS (SEPSIS)
100.0000 mL | Freq: Once | INTRAVENOUS | Status: AC
  Administered 2014-06-09: 100 mL via INTRAVENOUS

## 2014-06-09 NOTE — Progress Notes (Signed)
SUBJECTIVE: The patient is doing well today.   No new focal neuro changes. No bleeding  At this time, she denies chest pain, shortness of breath, or any new concerns.  . feeding supplement (ENSURE COMPLETE)  237 mL Oral TID BM  . furosemide  40 mg Oral Daily  . metoprolol tartrate  75 mg Oral BID  . pantoprazole  40 mg Oral Daily  . [MAR Hold] pneumococcal 23 valent vaccine  0.5 mL Intramuscular Tomorrow-1000  . potassium chloride  40 mEq Oral Daily  . sodium chloride  3 mL Intravenous Q12H  . warfarin  7.5 mg Oral ONCE-1800  . Warfarin - Pharmacist Dosing Inpatient   Does not apply q1800   . sodium chloride Stopped (06/03/14 1000)    OBJECTIVE: Physical Exam: Filed Vitals:   06/08/14 1456 06/08/14 2000 06/09/14 0617 06/09/14 1033  BP: 105/67 117/88 116/72 120/84  Pulse: 56 89 80 86  Temp: 97.9 F (36.6 C) 97.5 F (36.4 C) 97.6 F (36.4 C)   TempSrc: Oral Oral Oral   Resp: Height:      Weight:   161 lb 4.8 oz (73.165 kg)   SpO2: 100% 100% 100%     Intake/Output Summary (Last 24 hours) at 06/09/14 1226 Last data filed at 06/09/14 1000  Gross per 24 hour  Intake    860 ml  Output   1000 ml  Net   -140 ml    Telemetry reveals afib  GEN- The patient is well appearing, alert but perseverates and has difficulty providing a directed history (perhaps baseline) Head- normocephalic, atraumatic Eyes-  Sclera clear, conjunctiva pink Ears- hearing intact Oropharynx- clear Neck- supple,   Lungs- Clear to ausculation bilaterally, normal work of breathing Heart- irregular rate and rhythm, mechanical S1 GI- soft, NT, ND, + BS Extremities- no clubbing, cyanosis, or edema Skin- no rash or lesion Psych- euthymic mood, full affect Neuro- strength and sensation are intact  LABS: Basic Metabolic Panel:  Recent Labs  29/47/65 2049 06/08/14 0350  NA 137 135  K 4.4 4.4  CL 101 106  CO2 21 18*  GLUCOSE 132* 123*  BUN 21 24*  CREATININE 0.88 0.87  CALCIUM 9.0  8.4   Liver Function Tests:  Recent Labs  06/06/14 2049  AST 31  ALT 36*  ALKPHOS 134*  BILITOT 1.3*  PROT 6.1  ALBUMIN 3.1*   No results for input(s): LIPASE, AMYLASE in the last 72 hours. CBC:  Recent Labs  06/08/14 0350  WBC 7.2  HGB 15.0  HCT 43.9  MCV 93.4  PLT 173    ASSESSMENT AND PLAN:   ASSESSMENT AND PLAN: 64 y/o F w/ PMHx of rheumatic heart disease s/p mechanical MVR (1995), HTN, admitted on 05/29/14 w/ atrial fibrillation w/ RVR, also found to have LA thrombus. Patient developed respiratory distress on 06/02/14 which improved w/ BiPAP. Transferred to tele from CCU on 06/04/14 with ongoing altered mental status (oriented to person and place but has tangential thoughts).   1. Atrial Fibrillation with RVR: Rate controlled on metoprolol  BID. Goal INR 2.5-2.5  2. Acute on Chronic Systolic CHF: EF (20-25%).  Appears to be near euvolemic today. Will resume Lasix  qd.   3. Acute Encephalopathy:   I think that her tangential thoughts are not as far off of baseline as we have been led to believe.  I remember her well from care that I provided to her mother and find our conversation today very  similar to ones that we had in the past.   4. Cardiomyopathy: Presumed to be related to atrial fib. She will need ischemic evaluation if no improvement in LVEF after rate control.   5. S/p MVR: on coumadin with goal INR 2.5-3.5  Probably ready for discharge.  Will need CIR consult on Monday   Hillis RangeJames Anali Cabanilla, MD 06/09/2014 12:26 PM

## 2014-06-09 NOTE — Progress Notes (Signed)
Called by RN re: hypotension.  BP 72/42 mmHg  Pulse 86  Temp(Src) 97.6 F (36.4 C) (Oral)  Resp 18  Ht 5\' 4"  (1.626 m)  Wt 161 lb 4.8 oz (73.165 kg)  BMI 27.67 kg/m2  SpO2 100% Ms. Rogelia RohrerHolman received her medications this morning including metoprolol 75 mg and Lasix 40 mg.  This p.m., her tremor appears to be a little bit worse than usual and her blood pressure is low. The family is concerned.  Advised the RN that Dr. Johney FrameAllred felt the patient's mental status was at baseline today, after receiving her medications. With the low blood pressure, will give a small bolus of normal saline 100 mL. Requested that the patient be placed in semi-Fowler's or modified Trendelenburg. If these interventions do not help, called back. Will also decrease the metoprolol dose to 50 mg twice a day. If the patient's mental status does not improve, we'll check a head CT.  Theodore DemarkRhonda Cherly Erno, PA-C 06/09/2014 2:37 PM Beeper [phone]

## 2014-06-09 NOTE — Significant Event (Signed)
Rapid Response Event Note  Overview: Time Called: 1425 Arrival Time: 1430 Event Type: Hypotension  Initial Focused Assessment:  Called by primary RN for patient with hypotension, SBP 70's.  Upon my arrival to patients room, RN and husband at bedside.  Patient sitting up in bed with nasal cannula on.  Patient denies cp, states she has slight SOB.     Interventions:  RN paged and spoke to MD prior to my arrival with orders received.  Patient not able to tolerate trendelenburg.    Event Summary:  RN to call if assistance needed   at      at          [facility], Maryagnes Amosenise Ann

## 2014-06-09 NOTE — Progress Notes (Signed)
ANTICOAGULATION CONSULT NOTE - Follow Up Consult  Pharmacy Consult for Coumadin Indication: mech MVR/PAF  Allergies  Allergen Reactions  . Sulfamethoxazole     REACTION: unspecified    Patient Measurements: Height: 5\' 4"  (162.6 cm) Weight: 161 lb 4.8 oz (73.165 kg) (Scale B) IBW/kg (Calculated) : 54.7 Heparin Dosing Weight: 71 kg  Vital Signs: Temp: 97.6 F (36.4 C) (02/28 0617) Temp Source: Oral (02/28 0617) BP: 116/72 mmHg (02/28 0617) Pulse Rate: 80 (02/28 0617)  Labs:  Recent Labs  06/06/14 2049 06/07/14 0417 06/08/14 0350 06/09/14 0428  HGB  --   --  15.0  --   HCT  --   --  43.9  --   PLT  --   --  173  --   LABPROT  --  37.7* 45.6* 30.7*  INR  --  3.79* 4.85* 2.92*  CREATININE 0.88  --  0.87  --     Estimated Creatinine Clearance: 63.2 mL/min (by C-G formula based on Cr of 0.87).   Assessment: [age] YOF with mech MVR on warf PTA admitted 05/28/2014 with AFib/RVR (CHADSVASc 4). TEE with evidence of large LAA thrombus, DCCV canceled on 2/18 and warfarin resumed. INR very labile, today therapeutic at 2.92 after held dose yesterday for SUPRAtherapeutic INR at 4.85. CBC remains wnl and stable with no reported significant s/s bleeding.    PTA warfarin dose: alternates 5 mg and 10 mg (Admit INR 2.86); last coumadin clinic note states 7.5 daily except 5 MWF?  Goal of Therapy:  INR 2.5-3.5 Monitor platelets by anticoagulation protocol: Yes   Plan:  - Coumadin 7.5 mg PO x 1 tonight - Daily INR - Monitor CBC and for s/s bleeding  Rachel Cantu K. Bonnye FavaNicolsen, PharmD, BCPS Clinical Pharmacist - Resident Pager: [phone] Pharmacy: [phone] 06/09/2014 8:05 AM

## 2014-06-09 NOTE — Progress Notes (Signed)
Spoke to Rachel Cantu's daughter Rachel Cantu about updates from today.  Patient doing well this evening.  Vitals stable no complaints of pain.  Enjoying watching TV and snoozing in and out at the moment. Will continue to monitor for changes.

## 2014-06-10 LAB — PROTIME-INR
INR: 2.94 — ABNORMAL HIGH (ref 0.00–1.49)
Prothrombin Time: 30.9 seconds — ABNORMAL HIGH (ref 11.6–15.2)

## 2014-06-10 MED ORDER — DIGOXIN 125 MCG PO TABS
0.1250 mg | ORAL_TABLET | Freq: Every day | ORAL | Status: DC
  Administered 2014-06-10 – 2014-06-12 (×3): 0.125 mg via ORAL
  Filled 2014-06-10 (×3): qty 1

## 2014-06-10 MED ORDER — WARFARIN SODIUM 5 MG PO TABS
5.0000 mg | ORAL_TABLET | Freq: Once | ORAL | Status: AC
  Administered 2014-06-10: 5 mg via ORAL
  Filled 2014-06-10 (×2): qty 1

## 2014-06-10 NOTE — Progress Notes (Signed)
Utilization review complete. Londyn Hotard RN CCM Case Mgmt phone [phone] 

## 2014-06-10 NOTE — Progress Notes (Signed)
Patient Profile: 64 y/o F w/ PMHx of rheumatic heart disease s/p mechanical MVR (1995), HTN, admitted on 05/29/14 w/ atrial fibrillation w/ RVR, also found to have LA thrombus. Patient developed respiratory distress on 06/02/14 which improved w/ BiPAP. Transferred to tele from CCU on 06/04/14 with ongoing altered mental status (oriented to person and place but has tangential thoughts).   Subjective: No complaints. Odd Behavior (per Dr. Johney FrameAllred, this is her baseline).   Objective: Vital signs in last 24 hours: Temp:  [97.2 F (36.2 C)-97.6 F (36.4 C)] 97.5 F (36.4 C) (02/29 0602) Pulse Rate:  [71-86] 71 (02/29 0602) Resp:  [17-18] 17 (02/29 0602) BP: (72-120)/(42-88) 108/88 mmHg (02/29 0602) SpO2:  [94 %-100 %] 94 % (02/29 0602) Weight:  [160 lb 4.4 oz (72.7 kg)] 160 lb 4.4 oz (72.7 kg) (02/29 0602) Last BM Date: 06/09/14  Intake/Output from previous day: 02/28 0701 - 02/29 0700 In: 780 [P.O.:780] Out: 350 [Urine:350] Intake/Output this shift:    Medications Current Facility-Administered Medications  Medication Dose Route Frequency Provider Last Rate Last Dose  . 0.9 %  sodium chloride infusion  1,000 mL Intravenous Continuous Linwood DibblesJon Knapp, MD   Stopped at 06/03/14 1000  . 0.9 %  sodium chloride infusion  250 mL Intravenous PRN Ardis RowanMatthew Whitlock, MD      . acetaminophen (TYLENOL) tablet 650 mg  650 mg Oral Q4H PRN Ardis RowanMatthew Whitlock, MD      . feeding supplement (ENSURE COMPLETE) (ENSURE COMPLETE) liquid 237 mL  237 mL Oral TID BM Lorraine Laxeanne J Barnett, RD   237 mL at 06/09/14 2029  . furosemide (LASIX) tablet 40 mg  40 mg Oral Daily Courtney ParisEden W Jones, MD   40 mg at 06/09/14 1034  . metoprolol (LOPRESSOR) tablet 50 mg  50 mg Oral BID Joline SaltRhonda G Barrett, PA-C   50 mg at 06/09/14 2235  . pantoprazole (PROTONIX) EC tablet 40 mg  40 mg Oral Daily Mihai Croitoru, MD   40 mg at 06/09/14 1033  . [MAR Hold] pneumococcal 23 valent vaccine (PNU-IMMUNE) injection 0.5 mL  0.5 mL Intramuscular Tomorrow-1000  Mihai Croitoru, MD      . potassium chloride 20 MEQ/15ML (10%) solution 40 mEq  40 mEq Oral Daily Mihai Croitoru, MD   40 mEq at 06/09/14 1132  . sodium chloride 0.9 % injection 3 mL  3 mL Intravenous Q12H Ardis RowanMatthew Whitlock, MD   3 mL at 06/09/14 2236  . sodium chloride 0.9 % injection 3 mL  3 mL Intravenous PRN Ardis RowanMatthew Whitlock, MD      . Warfarin - Pharmacist Dosing Inpatient   Does not apply q1800 Severiano GilbertFrank Rhea Wilson, [facility]   Stopped at 06/08/14 1800    PE: General appearance: alert, cooperative and no distress Neck: no carotid bruit and no JVD Lungs: clear to auscultation bilaterally Heart: irregularly irregular rhythm Extremities: no edema Pulses: 2+ and symmetric Skin: warm and dry Neurologic: strength and sensation are intact.   Lab Results:   Recent Labs  06/08/14 0350  WBC 7.2  HGB 15.0  HCT 43.9  PLT 173   BMET  Recent Labs  06/08/14 0350  NA 135  K 4.4  CL 106  CO2 18*  GLUCOSE 123*  BUN 24*  CREATININE 0.87  CALCIUM 8.4   PT/INR  Recent Labs  06/08/14 0350 06/09/14 0428 06/10/14 0352  LABPROT 45.6* 30.7* 30.9*  INR 4.85* 2.92* 2.94*      Assessment/Plan  Active Problems:   Atrial fibrillation with RVR  S/P MVR (mitral valve replacement)   Acute diastolic CHF (congestive heart failure), NYHA class 3   Chronic anticoagulation   Atrial fibrillation with rapid ventricular response   Renal insufficiency   Acute systolic CHF (congestive heart failure), NYHA class 3   Renal failure   Mental status change  1. Atrial Fibrillation with RVR: Resting rate in the 90s-low 100s. Metoprolol decreased down to  BID due to hypotension on higher dose. ? Adding digoxin for added rate control given LV dysfunction. Continue Coumadin for a/c. INR is therapeutic at 2.94. Goal INR 2.5-3.5 (has mechanical MV).   2. Acute on Chronic Systolic CHF: EF (20-25%). Appears euvolemic today. Continue Lasix  qd.   3. Cardiomyopathy: Presumed to be related to  atrial fib. She will need ischemic evaluation if no improvement in LVEF after rate control. Recheck echo in 3 months.   4. S/p MVR: on coumadin with goal INR 2.5-3.5  5. Hypotension: patient developed hypotension yesterday with SBP in th3 70s. Metoprolol was decreased from 75 mg BID down to 50 mg BID. BP improved. Most recent was 108/88. Asymptomatic.      LOS: 12 days    Janeisha Ryle M. Sharol Harness, PA-C 06/10/2014 8:08 AM

## 2014-06-10 NOTE — Progress Notes (Signed)
Physical Therapy Treatment Patient Details Name: Rachel Cantu MRN: [id number] DOB: 1950-04-14 Today's Date: 06/10/2014    History of Present Illness [age] y.o. female w/ PMHx significant for rheumatic mitral dz s/p mech MVR, HTN who [facility] with complaints of 7 days of shortness of breath and palpitations. She presents with her husband. She is a rather difficult historian to follow and answers questions repetitively and tangentially goes off.  She was admitted with a dx of a fib with RVR.    PT Comments    Patient making some improvements with mobility and gait.  Will need 24 hour assist for mobility and safety at discharge.  Patient incontinent in chair and took no action (did not call for assist prior, or for clean up).  Will need to verify 24 hour assist available.  If not, may need to consider SNF.   Follow Up Recommendations  Home health PT;Supervision/Assistance - 24 hour (will need to verify 24 hour assist available)     Equipment Recommendations  None recommended by PT    Recommendations for Other Services       Precautions / Restrictions Precautions Precautions: Fall Restrictions Weight Bearing Restrictions: No    Mobility  Bed Mobility Overal bed mobility: Needs Assistance Bed Mobility: Sit to Supine       Sit to supine: Min assist   General bed mobility comments: Assist to bring LE's onto bed  Transfers Overall transfer level: Needs assistance Equipment used: 1 person hand held assist Transfers: Sit to/from Stand Sit to Stand: Min assist         General transfer comment: Tactile cues to initiate movement.  Assist to move to standing from chair and toilet.  As patient stood from chair, patient had been incontinent of bowel and bladder.  Patient to bathroom to "finish" her BM (loose, watery - RN notified).  Patient stood at sink for PT to clean patient and change gown.  Ambulation/Gait Ambulation/Gait assistance: Min  assist Ambulation Distance (Feet): 33 Feet (To and from bathroom - 15' x 2; and 3' chair to bed) Assistive device: 1 person hand held assist Gait Pattern/deviations: Step-through pattern;Decreased step length - right;Decreased step length - left;Decreased stride length Gait velocity: decreased Gait velocity interpretation: Below normal speed for age/gender General Gait Details: Patient declined use of RW.  Provided hand-held assist.  Patient with fair balance during gait.  Limited by fatigue.     Stairs            Wheelchair Mobility    Modified Rankin (Stroke Patients Only)       Balance           Standing balance support: Single extremity supported Standing balance-Leahy Scale: Fair                      Cognition Arousal/Alertness: Awake/alert Behavior During Therapy: WFL for tasks assessed/performed Overall Cognitive Status: Impaired/Different from baseline Area of Impairment: Attention;Following commands;Safety/judgement;Problem solving   Current Attention Level: Sustained   Following Commands: Follows one step commands inconsistently Safety/Judgement: Decreased awareness of safety   Problem Solving: Slow processing;Decreased initiation;Difficulty sequencing;Requires verbal cues General Comments: Repeated cues to remain focused on task.    Exercises      General Comments General comments (skin integrity, edema, etc.): Patient states she was not aware of being incontinent in chair.  Then thanked PT for cleaning her, stating it was uncomfortable.  Reinforced to patient to call nursing if she  needs to go to bathroom.      Pertinent Vitals/Pain Pain Assessment: No/denies pain    Home Living                      Prior Function            PT Goals (current goals can now be found in the care plan section) Progress towards PT goals: Progressing toward goals    Frequency  Min 3X/week    PT Plan Current plan remains appropriate     Co-evaluation             End of Session Equipment Utilized During Treatment: Gait belt Activity Tolerance: Patient limited by fatigue Patient left: in bed;with call bell/phone within reach;with bed alarm set (Nsg requested pt back to bed for IV team)     Time: 4098-1191 PT Time Calculation (min) (ACUTE ONLY): 26 min  Charges:  $Gait Training: 23-37 mins                    G Codes:      Vena Austria 07/04/2014, 7:10 PM Durenda Hurt. Renaldo Fiddler, [facility] Acute Rehab Services Pager [phone]

## 2014-06-10 NOTE — Progress Notes (Signed)
Physical medicine and rehabilitation consult requested with chart reviewed. Patient presently does not meet medical necessity for inpatient rehabilitation services. Latest therapy notes documents plan for home health therapies. Hold on formal rehabilitation consult at this time and recommendations follow-up physical therapy

## 2014-06-10 NOTE — Progress Notes (Signed)
ANTICOAGULATION CONSULT NOTE - Follow Up Consult  Pharmacy Consult for Coumadin Indication: mech MVR/PAF  Allergies  Allergen Reactions  . Sulfamethoxazole     REACTION: unspecified    Patient Measurements: Height: 5\' 4"  (162.6 cm) Weight: 160 lb 4.4 oz (72.7 kg) IBW/kg (Calculated) : 54.7 Heparin Dosing Weight: 71 kg  Vital Signs: Temp: 97.5 F (36.4 C) (02/29 0602) Temp Source: Oral (02/29 0602) BP: 108/88 mmHg (02/29 0602) Pulse Rate: 71 (02/29 0602)  Labs:  Recent Labs  06/08/14 0350 06/09/14 0428 06/10/14 0352  HGB 15.0  --   --   HCT 43.9  --   --   PLT 173  --   --   LABPROT 45.6* 30.7* 30.9*  INR 4.85* 2.92* 2.94*  CREATININE 0.87  --   --     Estimated Creatinine Clearance: 63 mL/min (by C-G formula based on Cr of 0.87).   Assessment: [age] YOF with mech MVR on warf PTA admitted 05/28/2014 with AFib/RVR (CHADSVASc 4). TEE with evidence of large LAA thrombus, DCCV canceled on 2/18 and warfarin resumed.   INR very labile, today therapeutic at 2.94   PTA warfarin dose: alternates 5 mg and 10 mg (Admit INR 2.86); last coumadin clinic note states 7.5 daily except 5 MWF?  Goal of Therapy:  INR 2.5-3.5 Monitor platelets by anticoagulation protocol: Yes   Plan:  - Coumadin 5 mg PO x 1 tonight - Daily INR - Monitor CBC and for s/s bleeding  Thank you. Okey RegalLisa Khalifa Knecht, PharmD [phone] 06/10/2014 10:06 AM

## 2014-06-11 DIAGNOSIS — I513 Intracardiac thrombosis, not elsewhere classified: Secondary | ICD-10-CM | POA: Diagnosis present

## 2014-06-11 LAB — PROTIME-INR
INR: 3.17 — AB (ref 0.00–1.49)
Prothrombin Time: 32.8 seconds — ABNORMAL HIGH (ref 11.6–15.2)

## 2014-06-11 MED ORDER — WARFARIN SODIUM 5 MG PO TABS
5.0000 mg | ORAL_TABLET | Freq: Once | ORAL | Status: AC
  Administered 2014-06-11: 5 mg via ORAL
  Filled 2014-06-11: qty 1

## 2014-06-11 MED ORDER — ENSURE COMPLETE PO LIQD: 237.0000 mL | ORAL | Status: DC

## 2014-06-11 MED ORDER — ADULT MULTIVITAMIN W/MINERALS CH
1.0000 | ORAL_TABLET | Freq: Every day | ORAL | Status: DC
  Administered 2014-06-11 – 2014-06-12 (×2): 1 via ORAL
  Filled 2014-06-11 (×2): qty 1

## 2014-06-11 MED ORDER — ENSURE PUDDING PO PUDG
1.0000 | ORAL | Status: DC
  Administered 2014-06-12: 1 via ORAL

## 2014-06-11 MED ORDER — BOOST / RESOURCE BREEZE PO LIQD
1.0000 | ORAL | Status: DC
  Administered 2014-06-12: 1 via ORAL

## 2014-06-11 NOTE — Progress Notes (Signed)
.  Patient Profile: 64 y/o F w/ PMHx of rheumatic heart disease s/p mechanical MVR (1995), HTN, admitted on 05/29/14 w/ atrial fibrillation w/ RVR, also found to have LA thrombus. Patient developed respiratory distress on 06/02/14 which improved w/ BiPAP. Transferred to tele from CCU on 06/04/14 with ongoing altered mental status (oriented to person and place but has tangential thoughts).   Subjective: No complaints. Odd Behavior (per Dr. Johney FrameAllred, this is her baseline).   Objective: Vital signs in last 24 hours: Temp:  [97.5 F (36.4 C)-98 F (36.7 C)] 97.5 F (36.4 C) (03/01 0703) Pulse Rate:  [59-120] 67 (03/01 0703) Resp:  [18-20] 18 (03/01 0703) BP: (93-129)/(59-78) 109/72 mmHg (03/01 0703) SpO2:  [93 %-100 %] 97 % (03/01 0703) Weight:  [159 lb 6.4 oz (72.303 kg)] 159 lb 6.4 oz (72.303 kg) (03/01 0558) Last BM Date: 06/10/14  Intake/Output from previous day: 02/29 0701 - 03/01 0700 In: 780 [P.O.:780] Out: 500 [Urine:500] Intake/Output this shift:    Medications Current Facility-Administered Medications  Medication Dose Route Frequency Provider Last Rate Last Dose  . 0.9 %  sodium chloride infusion  1,000 mL Intravenous Continuous Linwood DibblesJon Knapp, MD   Stopped at 06/03/14 1000  . 0.9 %  sodium chloride infusion  250 mL Intravenous PRN Ardis RowanMatthew Whitlock, MD      . acetaminophen (TYLENOL) tablet 650 mg  650 mg Oral Q4H PRN Ardis RowanMatthew Whitlock, MD      . digoxin (LANOXIN) tablet 0.125 mg  0.125 mg Oral Daily Quintella Reichertraci R Turner, MD   0.125 mg at 06/10/14 1440  . feeding supplement (ENSURE COMPLETE) (ENSURE COMPLETE) liquid 237 mL  237 mL Oral TID BM Lorraine Laxeanne J Barnett, RD   237 mL at 06/10/14 2000  . furosemide (LASIX) tablet 40 mg  40 mg Oral Daily Courtney ParisEden W Jones, MD   40 mg at 06/10/14 1012  . metoprolol (LOPRESSOR) tablet 50 mg  50 mg Oral BID Rhonda G Barrett, PA-C   50 mg at 06/10/14 1012  . pantoprazole (PROTONIX) EC tablet 40 mg  40 mg Oral Daily Mihai Croitoru, MD   40 mg at 06/10/14 1013  .  [MAR Hold] pneumococcal 23 valent vaccine (PNU-IMMUNE) injection 0.5 mL  0.5 mL Intramuscular Tomorrow-1000 Mihai Croitoru, MD      . potassium chloride 20 MEQ/15ML (10%) solution 40 mEq  40 mEq Oral Daily Mihai Croitoru, MD   40 mEq at 06/10/14 1012  . sodium chloride 0.9 % injection 3 mL  3 mL Intravenous Q12H Ardis RowanMatthew Whitlock, MD   3 mL at 06/11/14 0019  . sodium chloride 0.9 % injection 3 mL  3 mL Intravenous PRN Ardis RowanMatthew Whitlock, MD      . warfarin (COUMADIN) tablet 5 mg  5 mg Oral ONCE-1800 Thurmon FairMihai Croitoru, MD      . Warfarin - Pharmacist Dosing Inpatient   Does not apply q1800 Severiano GilbertFrank Rhea Wilson, [facility]        PE: General appearance: alert, cooperative and no distress Neck: no carotid bruit and no JVD Lungs: clear to auscultation bilaterally Heart: irregularly irregular rhythm Extremities: no edema Pulses: 2+ and symmetric Skin: warm and dry Neurologic: strength and sensation are intact.   Lab Results:  No results for input(s): WBC, HGB, HCT, PLT in the last 72 hours. BMET No results for input(s): NA, K, CL, CO2, GLUCOSE, BUN, CREATININE, CALCIUM in the last 72 hours. PT/INR  Recent Labs  06/09/14 0428 06/10/14 0352 06/11/14 0545  LABPROT 30.7* 30.9* 32.8*  INR  2.92* 2.94* 3.17*     Assessment/Plan  Active Problems:   Atrial fibrillation with RVR   S/P MVR (mitral valve replacement)   Acute diastolic CHF (congestive heart failure), NYHA class 3   Chronic anticoagulation   Atrial fibrillation with rapid ventricular response   Renal insufficiency   Acute systolic CHF (congestive heart failure), NYHA class 3   Renal failure   Mental status change   1. Atrial Fibrillation with RVR: Resting rate in the 80s-90s. Metoprolol decreased down to  BID due to hypotension on higher dose. Digoxin added yesterday for additional rate control given LV dysfunction. Continue Coumadin for a/c. INR is therapeutic at 3.17. Goal INR 2.5-3.5 (has mechanical MV and LAA thrombus).   2.  Acute on Chronic Systolic CHF: EF (20-25%). Appears euvolemic today. Denies dyspnea. Continue Lasix  qd.   3. Cardiomyopathy: Presumed to be related to atrial fib. She will need ischemic evaluation if no improvement in LVEF after rate control. Recheck echo in 3 months.   4. S/p MVR: on coumadin with goal INR 2.5-3.5  5. Hypotension:  BP improved. No further hypotension with decrease in BB.   6. Dispo: Evaluated for inpatient rehab and felt not to be a candidate. PT recommends Home Health PT;Supervision/Assistance - 24 hour (will need to verify 24 hour assist available). If not, may need to consider SNF. Case Management assisting.      LOS: 13 days    Aulani Shipton M. Sharol Harness, PA-C 06/11/2014 10:05 AM

## 2014-06-11 NOTE — Care Management Note (Addendum)
CARE MANAGEMENT NOTE 06/11/2014  Patient:  Rachel Cantu, Rachel Cantu   Account Number:  [id number]  Date Initiated:  05/30/2014  Documentation initiated by:  GRAVES-BIGELOW,BRENDA  Subjective/Objective Assessment:   Pt admitted for Atrial fibrillation with RVR. Initiated on Cardizem gtt.     Action/Plan:   CM to monitor for disposition needs.  06/11/2014   Anticipated DC Date:  06/13/2014   Anticipated DC Plan:  [place]  CM consult      Choice offered to / List presented to:             Status of service:  Completed, signed off Medicare Important Message given?  YES (If response is "NO", the following Medicare IM given date fields will be blank) Date Medicare IM given:  05/31/2014 Medicare IM given by:  Whitman Hero Date Additional Medicare IM given:  06/07/2014 Additional Medicare IM given by:  JULIE AMERSON 06/11/2014 IM given today, CRoyal RN MPH, case manager, [phone] Discharge Disposition:    Per UR Regulation:  Reviewed for med. necessity/level of care/duration of stay  If discussed at [place] of Stay Meetings, dates discussed:   06/04/2014    Comments:  06/11/2014 Met with pt who now is considering short term SNF due to need for 24hr assistance. CSW notified. CM will follow. CRoyal RN MPH, case manager, [phone]

## 2014-06-11 NOTE — Progress Notes (Signed)
FOLLOW-UP NUTRITION ASSESSMENT  INTERVENTION: Continue Ensure Enlive once daily, supplement provides 350 kcal and 20 grams of protein Provide Magic Cup ice cream BID with meals, each supplement provides 290 kcal and 13 grams of protein Provide Raytheonesource Breeze once daily Provide Ensure Pudding once daily Provide Multivitamin with minerals daily Encourage PO intake  NUTRITION DIAGNOSIS: Inadequate oral intake related to diarrhea and poor appetite as evidenced by meal refusal x 2 days; ongoing  Goal: Pt to meet >/= 90% of their estimated nutrition needs; unmet  Monitor:  PO intake, supplement acceptance, weight trend, labs, I/O's  ASSESSMENT: 64 y/o F w/ PMHx of rheumatic heart disease s/p mechanical MVR, HTN, admitted on 05/29/14 w/ atrial fibrillation w/ RVR, also found to have LA thrombus.  Pt continues to eat poorly with meal completion ranging 10% to 25%. Pt is being provided with supplements but, family in room report that patient is not drinking them. Patient is unable to explain why she isn't eating; she states that her appetite is very good. She is agreeable to trying a variety of nutritional supplements. RD emphasized the importance of nutrition and encouraged pt to eat at least half of the food on her meal trays.   -7.9 L fluid balance  Labs reviewed.   Height: Ht Readings from Last 1 Encounters:  06/02/14 5\' 4"  (1.626 m)    Weight: Wt Readings from Last 1 Encounters:  06/11/14 159 lb 6.4 oz (72.303 kg)  06/05/14 163 lb 05/29/14 168 lb  Usual Body Weight: 157 lb  BMI:  Body mass index is 27.35 kg/(m^2).  Estimated Nutritional Needs: Kcal: 1600-1800 Protein: 70-85 grams Fluid: 1.8 L/day  Skin: +1 generalized edema; +2 RLE and LLE edema  Diet Order: Diet Heart   Intake/Output Summary (Last 24 hours) at 06/11/14 1617 Last data filed at 06/11/14 1300  Gross per 24 hour  Intake    540 ml  Output    800 ml  Net   -260 ml    Last BM:  2/29  Labs:   Recent Labs Lab 06/06/14 0410 06/06/14 2049 06/08/14 0350  NA 136 137 135  K 4.0 4.4 4.4  CL 101 101 106  CO2 22 21 18*  BUN 19 21 24*  CREATININE 0.85 0.88 0.87  CALCIUM 8.9 9.0 8.4  GLUCOSE 116* 132* 123*    CBG (last 3)  No results for input(s): GLUCAP in the last 72 hours.  Scheduled Meds: . digoxin  0.125 mg Oral Daily  . feeding supplement (ENSURE COMPLETE)  237 mL Oral TID BM  . furosemide  40 mg Oral Daily  . metoprolol tartrate  50 mg Oral BID  . pantoprazole  40 mg Oral Daily  . [MAR Hold] pneumococcal 23 valent vaccine  0.5 mL Intramuscular Tomorrow-1000  . potassium chloride  40 mEq Oral Daily  . sodium chloride  3 mL Intravenous Q12H  . warfarin  5 mg Oral ONCE-1800  . Warfarin - Pharmacist Dosing Inpatient   Does not apply q1800    Continuous Infusions: . sodium chloride Stopped (06/03/14 1000)   Ian Malkineanne Barnett RD, LDN Inpatient Clinical Dietitian Pager: [phone] After Hours Pager: [phone]

## 2014-06-11 NOTE — Progress Notes (Signed)
ANTICOAGULATION CONSULT NOTE - Follow Up Consult  Pharmacy Consult for Coumadin Indication: mech MVR/PAF  Allergies  Allergen Reactions  . Sulfamethoxazole     REACTION: unspecified    Patient Measurements: Height: 5\' 4"  (162.6 cm) Weight: 159 lb 6.4 oz (72.303 kg) (scale B) IBW/kg (Calculated) : 54.7 Heparin Dosing Weight: 71 kg  Vital Signs: Temp: 97.5 F (36.4 C) (03/01 0703) Temp Source: Oral (03/01 0703) BP: 109/72 mmHg (03/01 0703) Pulse Rate: 67 (03/01 0703)  Labs:  Recent Labs  06/09/14 0428 06/10/14 0352 06/11/14 0545  LABPROT 30.7* 30.9* 32.8*  INR 2.92* 2.94* 3.17*    Estimated Creatinine Clearance: 62.8 mL/min (by C-G formula based on Cr of 0.87).   Assessment: [age] YOF with mech MVR on warf PTA admitted 05/28/2014 with AFib/RVR (CHADSVASc 4). TEE with evidence of large LAA thrombus, DCCV canceled on 2/18 and warfarin resumed.   INR very labile, today therapeutic at 3.17  PTA warfarin dose: alternates 5 mg and 10 mg (Admit INR 2.86); last coumadin clinic note states 7.5 daily except 5 MWF?  Goal of Therapy:  INR 2.5-3.5 Monitor platelets by anticoagulation protocol: Yes   Plan:  - Coumadin 5 mg PO x 1 tonight - Daily INR - Monitor CBC and for s/s bleeding  If home today, recommend 7.5 mg MWF, 5 mg other days with follow up on Friday (small dose decrease)  Thank you. Okey RegalLisa Shahrzad Koble, PharmD [phone] 06/11/2014 8:41 AM

## 2014-06-11 NOTE — Progress Notes (Signed)
Speech Language Pathology Treatment: Cognitive-Linquistic  Patient Details Name: Rachel Cantu MRN: [id number] DOB: 1950/08/28 Today's Date: 06/11/2014 Time: 1030-1050 SLP Time Calculation (min) (ACUTE ONLY): 20 min  Assessment / Plan / Recommendation Clinical Impression  F/u for cognition.  Per MD notes, pt's communication patterns are C/W baseline.  Language is fluent with circumlocutions.  Grammatical/syntactical form are normal.  There are clear deficits in word-retrieval with presence of phonemic paraphasias.  There is poor self-regulation.  Attempted to call daughter for insight re: length of time pt has shown qualitative language changes.  Rec continued SLP f/u - formalized aphasia assessment next date for further differential diagnosis.     HPI HPI: 64 y.o. female w/ PMHx significant for rheumatic mitral dz s/p mech MVR, HTN [facility] with complaints of 7 days of shortness of breath and palpitations. 64 year old female with altered mental status. Remaining neurological examination is non-focal. Showering of emboli remains on the differential and at this point is likely per MD. Can not MRI.    Pertinent Vitals Pain Assessment: No/denies pain  SLP Plan  Continue with current plan of care    Recommendations                Plan: Continue with current plan of care       Blenda MountsCouture, Rachel Cantu 06/11/2014, 3:04 PM

## 2014-06-12 ENCOUNTER — Telehealth: Payer: Self-pay | Admitting: Cardiology

## 2014-06-12 DIAGNOSIS — I213 ST elevation (STEMI) myocardial infarction of unspecified site: Secondary | ICD-10-CM

## 2014-06-12 DIAGNOSIS — I5043 Acute on chronic combined systolic (congestive) and diastolic (congestive) heart failure: Secondary | ICD-10-CM

## 2014-06-12 DIAGNOSIS — I10 Essential (primary) hypertension: Secondary | ICD-10-CM

## 2014-06-12 LAB — PROTIME-INR
INR: 3.09 — AB (ref 0.00–1.49)
PROTHROMBIN TIME: 32.1 s — AB (ref 11.6–15.2)

## 2014-06-12 LAB — BASIC METABOLIC PANEL
ANION GAP: 5 (ref 5–15)
BUN: 24 mg/dL — ABNORMAL HIGH (ref 6–23)
CALCIUM: 9.1 mg/dL (ref 8.4–10.5)
CHLORIDE: 105 mmol/L (ref 96–112)
CO2: 28 mmol/L (ref 19–32)
Creatinine, Ser: 0.98 mg/dL (ref 0.50–1.10)
GFR calc Af Amer: 69 mL/min — ABNORMAL LOW (ref >90)
GFR calc non Af Amer: 59 mL/min — ABNORMAL LOW (ref >90)
Glucose, Bld: 185 mg/dL — ABNORMAL HIGH (ref 70–99)
Potassium: 4.1 mmol/L (ref 3.5–5.1)
Sodium: 138 mmol/L (ref 135–145)

## 2014-06-12 MED ORDER — FUROSEMIDE 10 MG/ML IJ SOLN
INTRAMUSCULAR | Status: AC
  Filled 2014-06-12: qty 4

## 2014-06-12 MED ORDER — WARFARIN SODIUM 5 MG PO TABS: ORAL_TABLET | ORAL | Status: DC

## 2014-06-12 MED ORDER — DIGOXIN 125 MCG PO TABS: 0.1250 mg | ORAL_TABLET | Freq: Every day | ORAL | Status: AC

## 2014-06-12 MED ORDER — PANTOPRAZOLE SODIUM 40 MG PO TBEC: 40.0000 mg | DELAYED_RELEASE_TABLET | Freq: Every day | ORAL | Status: DC

## 2014-06-12 MED ORDER — FUROSEMIDE 10 MG/ML IJ SOLN
40.0000 mg | Freq: Once | INTRAMUSCULAR | Status: AC
  Administered 2014-06-12: 40 mg via INTRAVENOUS

## 2014-06-12 MED ORDER — POTASSIUM CHLORIDE ER 20 MEQ PO TBCR: 10.0000 meq | EXTENDED_RELEASE_TABLET | Freq: Every day | ORAL | Status: DC

## 2014-06-12 MED ORDER — WARFARIN SODIUM 5 MG PO TABS
5.0000 mg | ORAL_TABLET | Freq: Once | ORAL | Status: AC
  Administered 2014-06-12: 5 mg via ORAL
  Filled 2014-06-12: qty 1

## 2014-06-12 MED ORDER — FUROSEMIDE 40 MG PO TABS: 40.0000 mg | ORAL_TABLET | Freq: Every day | ORAL | Status: DC

## 2014-06-12 MED ORDER — METOPROLOL TARTRATE 50 MG PO TABS: 50.0000 mg | ORAL_TABLET | Freq: Two times a day (BID) | ORAL | Status: AC

## 2014-06-12 NOTE — Clinical Social Work Placement (Signed)
Clinical Social Work Department CLINICAL SOCIAL WORK PLACEMENT NOTE 06/12/2014  Patient:  Rachel Cantu,Rachel Cantu  Account Number:  [id number] Admit date:  05/28/2014  Clinical Social Worker:  Corlis HoveJENEYA MCLEAN, CLINICAL SOCIAL WORKER  Date/time:  06/07/2014 10:26 PM  Clinical Social Work is seeking post-discharge placement for this patient at the following level of care:   SKILLED NURSING   (*CSW will update this form in Epic as items are completed)   06/07/2014  Patient/family provided with [facility] Department of Clinical Social Works list of facilities offering this level of care within the geographic area requested by the patient (or if unable, by the patients family).  06/07/2014  Patient/family informed of their freedom to choose among providers that offer the needed level of care, that participate in Medicare, Medicaid or managed care program needed by the patient, have an available bed and are willing to accept the patient.  06/07/2014  Patient/family informed of [facility] ownership interest in [facility], as well as of the fact that they are under no obligation to receive care at this facility.  PASARR submitted to EDS on 06/07/2014 PASARR number received on 06/07/2014  FL2 transmitted to all facilities in geographic area requested by pt/family on  06/07/2014 FL2 transmitted to all facilities within larger geographic area on 06/07/2014  Patient informed that his/her managed care company has contracts with or will negotiate with  certain facilities, including the following:   NA     Patient/family informed of bed offers received:  06/12/2014 [facility] [place] Physician recommends and patient chooses bed at    [facility] [place] on  06/12/2014 Patient to be transferred to facility by Ambulance (EMS) Patient and family notified of transfer on 06/12/2014 Name of family member  notified:  Daughter- Rachel Cantu and Husband- Rachel Cantu  The following physician request were entered in Epic: Physician Request  Please prepare priority discharge summary and prescriptions.  Please sign FL2.    Additional Comments: 06/12/14 Per MD- patient is medically stable for d/c today to SNF.  CSW spoke extensively throughout the day to patient's daughter Rachel Cantu and to Husband Rachel Cantu as d/c options were reviewed.  Family was somewhat conflicted as to what course of action to take as patient's brother and sister came from out of town and could help with patient at home until the first of next week. After that- there would not be anyone to stay with patient and she still requires 24 hour supervision.  Family has elected to seek short term SNF and hopefully patient will improve to where she can either be managed at home or in an Assisted Living. Daughter Rachel Cantu is considering the possibiltiy of briniging her to live in [place] where she [place].  Patient remains alert and oriented to person some of the time with extensive tansgenital thoughts.  She is also eating very poorly at this time. Daughter verbalized frustrations that her mother was being d/c'd frome the hospital due to this as well as the fact that she required 6624 hou supervision. An extensive discussion was held with both daughter and patient's husband to re-discuss hospital stay criteria vs SNF vs ALF vs SNF.  Physical Therapy had recommended home health PT until today when evalaution was changed to SNF.  Daughter is also very frustrated that her mother did not have a financial or HCPOA and she is unable to acccess her mother's financial situation to determine status  of social security, disability, and Medicaid. Patient receives Medicare but was still working full time prior to this hospitalization. She was very private in her personal fiancial situation and her husband did not have access to this information as she paid the bills etc.  Daughter requested  guidance on what do to as she cannot determine the status of her mother's finances. Discussed possible need to seek Guardianship due to her mothers cognitive deterioration and discussed this process. Daughter is aware that this process can take a long period of time.  Family is aware of private duty option as well as home health and may seek this in the future.  CSW spoke with daughter and husband off and on throughout the day. Daughter is requesting patient's medical records and patient's husband was notified to go to Medical Records to request records. Nursing notified of d/c plan and called report to [facility]. EMS arranged.  Patient was notified but it is unsure if she fully understands but she is agreeable to rehab stay. Her thought remain tangenital. Daughter and patient's husband were appreciative of CSW's information and assistance but continue to verbalize frustrations regarding her hospitalization, the medical care that she recieved  while here and daugther wants to be very invovled in her mother's atercare appointments and needs.  This family is extremely supportive and caring. CSW signing off. Lorri Frederick. Amoni Morales, LCSW  [phone]

## 2014-06-12 NOTE — Discharge Summary (Signed)
Physician Discharge Summary  Patient ID: Rachel Cantu MRN: [id number] DOB/AGE: [birth year] 64 y.o.   Primary Cardiologist: Dr. 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 female, followed in the past by Dr. 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 She presents with her husband. She is a rather difficult historian to follow and answers questions repetitively and tangentially goes off. In ER, she was found to be in atrial fibrillation w/ RVR in the 140s and also in acute decompensated HF. She developed respiratory distress and initially required BiPap. She was placed on IV cardizem as well as IV Lasix and was admitted. Cardiac enzymes were cycled and were minimally elevated with flat trend. 2D echo showed systolic dysfunction with EF of 20-25%. She denied chest pain. It was felt troponin elevation was secondary to demand ischemia in the setting of atrial fibrillation w/ RVR and that her cardiomyopathy was also tachy induced. Her rate improved with IV Cardizem, however she continue in atrial fibrillation. Decision was made to attempt TEE guided DCCV. However, TEE demonstrated a large LAA thrombus. Her mechanical valve was ok. Due to LAA thrombus, DCCV was canceled. She was also not a candidate for  Amiodarone due to thrombus. Decision was made to continue rate control with metoprolol. Digoxin was added for additional rate control. She was continued on Coumadin. Her atrial fibrillation was well controlled medically in the 80s resting. She diuresed well with IV Lasix. Symptoms improved. Also notable this admission, the patient was evaluated by head CT in the setting of suspected AMS and confusion. This was a normal study. She was assessed by PT who determined she would need 24 hr assistance at time of discharge. She was evaluated for inpatient rehab and felt not to be a candidate. Social work was consulted to assist patient and family with needs. Her husband works and cannot take care of her and all the bedrooms are upstairs. SNF placement was recommended and arranged. She was seen and examined by Dr. Mayford Knifeurner who felt she was stable for discharge to SNF. Pharmacy recommended 7.5 mg of Coumadin MWF and 5 mg on other days. It was also recommended that her INR be checked on 06/14/14. Post-hospital f/u has been arranged with Tereso NewcomerScott Weaver, PA-C on 06/27/14. It has been recommended that she get a repeat 2D echo in 3 months to reassess LVF.    Consults: None  Significant Diagnostic Studies:    2D echo 05/29/14  Study Conclusions  - Left ventricle: The cavity size was normal. D-shaped septum consistent with right ventricular pressure/ volume overload. There was mild focal basal hypertrophy of the septum. Systolic function was severely reduced. The estimated ejection fraction was in the range of 20% to 25%. Diffuse hypokinesis. - Ventricular septum: Septal motion showed paradox. - Aortic valve: There was mild regurgitation. - Mitral  valve: A mechanical prosthesis was present and functioning normally. The prosthesis had a normal range of motion. The sewing ring appeared normal, had no rocking motion, and showed no evidence of dehiscence. - Left atrium: The atrium was moderately dilated. -  Right ventricle: The cavity size was moderately dilated. Wall thickness was normal. Systolic function was moderately reduced. - Right atrium: The atrium was moderately dilated. - Tricuspid valve: There was moderate regurgitation. - Pulmonary arteries: Systolic pressure was moderately increased. PA peak pressure: 44 mm Hg (S).  Impressions:  - When compared to prior echocardiogram, 2009, EF is now reduced. (Previously 60%)   TEE 05/30/14 Study Conclusions  - Left ventricle: Systolic function was severely reduced. The estimated ejection fraction was in the range of 25% to 30%. Diffuse hypokinesis. - Aortic valve: No evidence of vegetation. There was trivial regurgitation. - Mitral valve: A mechanical prosthesis was present. There was mild regurgitation. - Left atrium: The atrium was moderately dilated. There was an apparent, largethrombusin the appendage. There was mild spontaneous echo contrast (&quot;smoke&quot;) in the cavity. - Right ventricle: The cavity size was mildly dilated. Systolic function was severely reduced. - Right atrium: The atrium was mildly dilated. No evidence of thrombus in the atrial cavity or appendage. - Atrial septum: No defect or patent foramen ovale was identified. - Tricuspid valve: No evidence of vegetation. There was moderate-severe regurgitation. - Pulmonic valve: No evidence of vegetation.  Impressions:  - Severe global reduction in LV function; moderate LAE with large LAA thrombus noted; mild RAE/RVE with severely reduced RV function; mechanical MV with mild MR; mildly elevated mean gradient of 9 mmHg; mild MR; mild AI; moderate to severe TR.   Treatments: See Hospital Course   Discharge Exam: Blood pressure 106/57, pulse 52, temperature 98.1 F (36.7 C), temperature source Oral, resp. rate 18, height  (1.626 m), weight 159 lb 2.1 oz (72.18 kg), SpO2 100 %.   Disposition:       Discharge  Instructions    Diet - low sodium heart healthy    Complete by:  As directed      Increase activity slowly    Complete by:  As directed             Medication List    STOP taking these medications        olmesartan-hydrochlorothiazide 40-12.5 MG per tablet  Commonly known as:  BENICAR HCT     valsartan-hydrochlorothiazide 160-25 MG per tablet  Commonly known as:  DIOVAN-HCT      TAKE these medications        digoxin 0.125 MG tablet  Commonly known as:  LANOXIN  Take 1 tablet (0.125 mg total) by mouth daily.     furosemide 40 MG tablet  Commonly known as:  LASIX  Take 1 tablet (40 mg total) by mouth daily.     metoprolol 50 MG tablet  Commonly known as:  LOPRESSOR  Take 1 tablet (50 mg total) by mouth 2 (two) times daily.     pantoprazole 40 MG tablet  Commonly known as:  PROTONIX  Take 1 tablet (40 mg total) by mouth daily.     Potassium Chloride ER 20 MEQ Tbcr  Take 10 mEq by mouth daily.     warfarin 5 MG tablet  Commonly known as:  COUMADIN  Take 1 1/2 tablets (7.5 mg) MWF. Take 1 tablet (5 mg) Tue, Thurs, Sat, Sun       Follow-up Information    Follow up with  Tereso Newcomer, PA-C On 06/27/2014.   Specialty:  Physician Assistant   Why:  3:20 pm    Contact information:   [address] [place] [address] [phone]       TIME SPENT ON DISCHARGE, INCLUDING PHYSICIAN TIME: >30 MINTUES  Signed: Thadeus Gandolfi 06/12/2014, 1:40 PM

## 2014-06-12 NOTE — Progress Notes (Signed)
ANTICOAGULATION CONSULT NOTE - Follow Up Consult  Pharmacy Consult for Coumadin Indication: mech MVR/PAF  Allergies  Allergen Reactions  . Sulfamethoxazole     REACTION: unspecified    Patient Measurements: Height: 5\' 4"  (162.6 cm) Weight: 159 lb 2.1 oz (72.18 kg) (Scale B) IBW/kg (Calculated) : 54.7 Heparin Dosing Weight: 71 kg  Vital Signs: Temp: 98.1 F (36.7 C) (03/02 0701) Temp Source: Oral (03/02 0701) BP: 111/58 mmHg (03/02 0701) Pulse Rate: 70 (03/02 0701)  Labs:  Recent Labs  06/10/14 0352 06/11/14 0545 06/12/14 0500  LABPROT 30.9* 32.8* 32.1*  INR 2.94* 3.17* 3.09*    Estimated Creatinine Clearance: 62.8 mL/min (by C-G formula based on Cr of 0.87).   Assessment: [age] YOF with mech MVR on warf PTA admitted 05/28/2014 with AFib/RVR (CHADSVASc 4). TEE with evidence of large LAA thrombus, DCCV canceled on 2/18 and warfarin resumed.   INR very labile, today therapeutic at 3.09  PTA warfarin dose: alternates 5 mg and 10 mg (Admit INR 2.86); last coumadin clinic note states 7.5 daily except 5 MWF?  Goal of Therapy:  INR 2.5-3.5 Monitor platelets by anticoagulation protocol: Yes   Plan:  - Coumadin 5 mg PO x 1 tonight - Daily INR - Monitor CBC and for s/s bleeding  If discharged today, recommend 7.5 mg MWF, 5 mg other days with follow up on Friday (small dose decrease)  Thank you. Okey RegalLisa Arrow Tomko, PharmD [phone] 06/12/2014 8:50 AM

## 2014-06-12 NOTE — Progress Notes (Signed)
Speech Language Pathology Treatment: Cognitive-Linquistic  Patient Details Name: Rachel DumasJanice E Yager MRN: [id number] DOB: 07/18/1950 Today's Date: 06/12/2014 Time: 1600-1630 SLP Time Calculation (min) (ACUTE ONLY): 30 min  Assessment / Plan / Recommendation Clinical Impression  Skilled treatment session focused on addressing diagnostic treatment of cognitive-linguisitc abilities.  Patient completed verbal expression subsection of the Burns Left Hemisphere Inventory with deficits in the areas of automatic speech and responsive naming (verbs) sections.  Topic maintenance and perseveration were other observed deficits that impact overall expressive communication abilities.  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  Recommend that patient participate in a full aphasia battery/assessment at the next level of care; recommendations shared with patient and husband.      HPI HPI: 64 y.o. female w/ PMHx significant for rheumatic mitral dz s/p mech MVR, HTN who [facility]2014 with complaints of 7 days of shortness of breath and palpitations. 64 year old female with altered mental status. Remaining neurological examination is non-focal. Showering of emboli remains on the differential and at this point is likely per MD. Can not MRI.    Pertinent Vitals Pain Assessment: No/denies pain  SLP Plan  Continue with current plan of care    Recommendations                Oral Care Recommendations: Oral care BID Follow up Recommendations: 24 hour supervision/assistance;Skilled Nursing facility;Inpatient Rehab Plan: Continue with current plan of care    GO    Charlane FerrettiMelissa Mckynzi Cammon, M.A., CCC-SLP [phone]  Keyanna Sandefer 06/12/2014, 4:46 PM

## 2014-06-12 NOTE — Progress Notes (Signed)
Physical Therapy Treatment Patient Details Name: Rachel DumasJanice E Gellatly MRN: [id number] DOB: 1950-08-10 Today's Date: 06/12/2014    History of Present Illness 64 y.o. female w/ PMHx significant for rheumatic mitral dz s/p mech MVR, HTN who [facility] with complaints of 7 days of shortness of breath and palpitations. She presents with her husband. She is a rather difficult historian to follow and answers questions repetitively and tangentially goes off.  She was admitted with a dx of a fib with RVR.    PT Comments    Patient seen for therapy session, progressed with ambulation distance but continues to demonstrate some instability and poor safety awareness. Patient with decreased recognition of deficits.  At this time, feel patient may benefit from ST SNF upon acute discharge to address mobility/balance deficits and safety/cognitive concerns. Will continue to see as indicated and progress as tolerated.   Follow Up Recommendations  SNF;Supervision/Assistance - 24 hour (will need to verify 24 hour assist available)     Equipment Recommendations  None recommended by PT    Recommendations for Other Services Speech consult     Precautions / Restrictions Precautions Precautions: Fall Restrictions Weight Bearing Restrictions: No    Mobility  Bed Mobility               General bed mobility comments: up in chair  Transfers Overall transfer level: Needs assistance Equipment used: 1 person hand held assist Transfers: Sit to/from Stand Sit to Stand: Min assist         General transfer comment: Increased time to initiate tasks, minimal assist for stability upon coming to standing, x3  Ambulation/Gait Ambulation/Gait assistance: Min assist Ambulation Distance (Feet): 110 Feet (x2 with assist and increased time (3 rest breaks)) Assistive device: 1 person hand held assist Gait Pattern/deviations: Step-through pattern;Decreased stride length Gait  velocity: decreased Gait velocity interpretation: Below normal speed for age/gender General Gait Details: patient with some noted instability during ambulation, Max cues to direct to task, significant time to perform activity. 3 standing rest breaks.    Stairs            Wheelchair Mobility    Modified Rankin (Stroke Patients Only)       Balance   Sitting-balance support: No upper extremity supported Sitting balance-Leahy Scale: Good     Standing balance support: Single extremity supported Standing balance-Leahy Scale: Fair                      Cognition Arousal/Alertness: Awake/alert Behavior During Therapy: WFL for tasks assessed/performed Overall Cognitive Status: Impaired/Different from baseline Area of Impairment: Attention;Following commands;Safety/judgement;Problem solving   Current Attention Level: Sustained   Following Commands: Follows one step commands inconsistently Safety/Judgement: Decreased awareness of safety   Problem Solving: Slow processing;Decreased initiation;Difficulty sequencing;Requires verbal cues General Comments: Repeated cues to remain focused on task.    Exercises General Exercises - Lower Extremity Ankle Circles/Pumps: AROM;Both Long Arc Quad: AROM;Both Hip Flexion/Marching: AROM;Both    General Comments General comments (skin integrity, edema, etc.): attempted to perform multiple ther ex with Max cues and redirection to tasks      Pertinent Vitals/Pain      Home Living                      Prior Function            PT Goals (current goals can now be found in the care plan section) Acute Rehab PT Goals  Patient Stated Goal: home PT Goal Formulation: With patient Time For Goal Achievement: 06/19/14 Potential to Achieve Goals: Good Progress towards PT goals: Progressing toward goals    Frequency  Min 3X/week    PT Plan Current plan remains appropriate    Co-evaluation             End of  Session Equipment Utilized During Treatment: Gait belt Activity Tolerance: Patient tolerated treatment well;Patient limited by fatigue (and cognition) Patient left: in chair;with call bell/phone within reach;with chair alarm set (Nsg requested pt back to bed for IV team)     Time: 2130-8657 PT Time Calculation (min) (ACUTE ONLY): 27 min  Charges:  $Gait Training: 8-22 mins $Therapeutic Activity: 8-22 mins                    G CodesFabio Asa 2014-06-24, 1:19 PM Charlotte Crumb, PT DPT  [phone]

## 2014-06-12 NOTE — Telephone Encounter (Signed)
ERROR

## 2014-06-12 NOTE — Progress Notes (Signed)
Patient Profile: 64 y/o F w/ PMHx of rheumatic heart disease s/p mechanical MVR (1995), HTN, admitted on 05/29/14 w/ atrial fibrillation w/ RVR, also found to have LA thrombus. Patient developed respiratory distress on 06/02/14 which improved w/ BiPAP. Transferred to tele from CCU on 06/04/14 with ongoing altered mental status (oriented to person and place but has tangential thoughts).   Subjective: No complaints. Denies CP and dyspnea. Odd Behavior (per Dr. Johney FrameAllred, this is her baseline).   Objective: Vital signs in last 24 hours: Temp:  [97.6 F (36.4 C)-98.7 F (37.1 C)] 98.1 F (36.7 C) (03/02 0701) Pulse Rate:  [52-78] 52 (03/02 1039) Resp:  [18] 18 (03/02 0701) BP: (106-118)/(57-66) 106/57 mmHg (03/02 1039) SpO2:  [100 %] 100 % (03/02 0701) Weight:  [159 lb 2.1 oz (72.18 kg)] 159 lb 2.1 oz (72.18 kg) (03/02 0701) Last BM Date: 06/11/14  Intake/Output from previous day: 03/01 0701 - 03/02 0700 In: 1140 [P.O.:1140] Out: 1375 [Urine:1375] Intake/Output this shift: Total I/O In: 60 [P.O.:60] Out: 150 [Urine:150]  Medications Current Facility-Administered Medications  Medication Dose Route Frequency Provider Last Rate Last Dose  . 0.9 %  sodium chloride infusion  1,000 mL Intravenous Continuous Linwood DibblesJon Knapp, MD   Stopped at 06/03/14 1000  . 0.9 %  sodium chloride infusion  250 mL Intravenous PRN Ardis RowanMatthew Whitlock, MD      . acetaminophen (TYLENOL) tablet 650 mg  650 mg Oral Q4H PRN Ardis RowanMatthew Whitlock, MD      . digoxin (LANOXIN) tablet 0.125 mg  0.125 mg Oral Daily Quintella Reichertraci R Turner, MD   0.125 mg at 06/12/14 1039  . feeding supplement (ENSURE COMPLETE) (ENSURE COMPLETE) liquid 237 mL  237 mL Oral Q24H Reanne Vista LawmanJ Barnett, RD      . feeding supplement (ENSURE) (ENSURE) pudding 1 Container  1 Container Oral Q24H Lorraine Laxeanne J Barnett, RD      . feeding supplement (RESOURCE BREEZE) (RESOURCE BREEZE) liquid 1 Container  1 Container Oral Q24H Lorraine Laxeanne J Barnett, RD   1 Container at 06/12/14 0800  .  furosemide (LASIX) tablet 40 mg  40 mg Oral Daily Courtney ParisEden W Jones, MD   40 mg at 06/12/14 1039  . metoprolol (LOPRESSOR) tablet 50 mg  50 mg Oral BID Joline SaltRhonda G Barrett, PA-C   50 mg at 06/12/14 1039  . multivitamin with minerals tablet 1 tablet  1 tablet Oral Daily Lorraine Laxeanne J Barnett, RD   1 tablet at 06/12/14 1040  . pantoprazole (PROTONIX) EC tablet 40 mg  40 mg Oral Daily Mihai Croitoru, MD   40 mg at 06/12/14 1040  . [MAR Hold] pneumococcal 23 valent vaccine (PNU-IMMUNE) injection 0.5 mL  0.5 mL Intramuscular Tomorrow-1000 Mihai Croitoru, MD      . potassium chloride 20 MEQ/15ML (10%) solution 40 mEq  40 mEq Oral Daily Mihai Croitoru, MD   40 mEq at 06/12/14 1041  . sodium chloride 0.9 % injection 3 mL  3 mL Intravenous Q12H Ardis RowanMatthew Whitlock, MD   3 mL at 06/12/14 1041  . sodium chloride 0.9 % injection 3 mL  3 mL Intravenous PRN Ardis RowanMatthew Whitlock, MD      . warfarin (COUMADIN) tablet 5 mg  5 mg Oral ONCE-1800 Thurmon FairMihai Croitoru, MD      . Warfarin - Pharmacist Dosing Inpatient   Does not apply q1800 Severiano GilbertFrank Rhea Wilson, [facility]        PE: General appearance: alert, cooperative and no distress Neck: no carotid bruit and no JVD Lungs: clear to  auscultation bilaterally Heart: irregularly irregular rhythm Extremities: no edema Pulses: 2+ and symmetric Skin: warm and dry Neurologic: strength and sensation are intact.   Lab Results:  No results for input(s): WBC, HGB, HCT, PLT in the last 72 hours. BMET No results for input(s): NA, K, CL, CO2, GLUCOSE, BUN, CREATININE, CALCIUM in the last 72 hours. 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 Atrial Fibrillation with RVR: Resting rate in the 80s-90s. Metoprolol decreased down to  BID due to hypotension on higher dose. Digoxin added yesterday for additional rate control given LV dysfunction. Continue Coumadin for a/c. INR is therapeutic at 3.09. Goal INR 2.5-3.5 (has mechanical MV and LAA thrombus).   2. Acute on Chronic Systolic CHF: EF (20-25%). Euvolemic on exam. Denies dyspnea. Continue Lasix  qd.   3. Cardiomyopathy: Presumed to be related to atrial fib. She will need ischemic evaluation if no improvement in LVEF after rate control. Recheck echo in 3 months.   4. S/p MVR: on coumadin with goal INR 2.5-3.5  5. Hypotension:  BP improved. No further hypotension with decrease in BB.   6. Dispo: Evaluated for inpatient rehab and felt not to be a candidate. PT recommends Home Health PT;Supervision/Assistance - 24 housr. She will not have 24 hr assistance at home. She will need SNF placement. Case Management assisting. She is medically stable. D/c once she has placement.      LOS: 14 days    Brittainy M. Sharol Harness, PA-C 06/12/2014 10:45 AM

## 2014-06-14 ENCOUNTER — Ambulatory Visit: Payer: Medicare Other | Admitting: Internal Medicine

## 2014-06-14 ENCOUNTER — Non-Acute Institutional Stay (SKILLED_NURSING_FACILITY): Payer: Self-pay | Admitting: Adult Health

## 2014-06-14 DIAGNOSIS — Z952 Presence of prosthetic heart valve: Secondary | ICD-10-CM

## 2014-06-14 DIAGNOSIS — K219 Gastro-esophageal reflux disease without esophagitis: Secondary | ICD-10-CM

## 2014-06-14 DIAGNOSIS — I4891 Unspecified atrial fibrillation: Secondary | ICD-10-CM

## 2014-06-14 DIAGNOSIS — I5043 Acute on chronic combined systolic (congestive) and diastolic (congestive) heart failure: Secondary | ICD-10-CM

## 2014-06-14 DIAGNOSIS — Z954 Presence of other heart-valve replacement: Secondary | ICD-10-CM

## 2014-06-17 ENCOUNTER — Encounter: Payer: Self-pay | Admitting: Internal Medicine

## 2014-06-17 ENCOUNTER — Non-Acute Institutional Stay (SKILLED_NURSING_FACILITY): Payer: Medicare Other | Admitting: Adult Health

## 2014-06-17 ENCOUNTER — Ambulatory Visit (INDEPENDENT_AMBULATORY_CARE_PROVIDER_SITE_OTHER): Payer: Medicare Other | Admitting: Internal Medicine

## 2014-06-17 VITALS — BP 100/70 | Temp 97.5°F | Ht 64.0 in | Wt 162.6 lb

## 2014-06-17 DIAGNOSIS — Z5181 Encounter for therapeutic drug level monitoring: Secondary | ICD-10-CM

## 2014-06-17 DIAGNOSIS — I4891 Unspecified atrial fibrillation: Secondary | ICD-10-CM

## 2014-06-17 DIAGNOSIS — Z952 Presence of prosthetic heart valve: Secondary | ICD-10-CM

## 2014-06-17 DIAGNOSIS — I5043 Acute on chronic combined systolic (congestive) and diastolic (congestive) heart failure: Secondary | ICD-10-CM

## 2014-06-17 DIAGNOSIS — Z09 Encounter for follow-up examination after completed treatment for conditions other than malignant neoplasm: Secondary | ICD-10-CM

## 2014-06-17 DIAGNOSIS — Z954 Presence of other heart-valve replacement: Secondary | ICD-10-CM

## 2014-06-17 DIAGNOSIS — R4182 Altered mental status, unspecified: Secondary | ICD-10-CM

## 2014-06-17 DIAGNOSIS — Z7901 Long term (current) use of anticoagulants: Secondary | ICD-10-CM

## 2014-06-17 NOTE — Clinical Social Work Psychosocial (Addendum)
    Clinical Social Work Department BRIEF PSYCHOSOCIAL ASSESSMENT 06/07/2014  Patient:  Rachel Cantu,Rachel Cantu     Account Number:  [id number]     Admit date:  05/28/2014  Clinical Social Worker:  Manya SilvasROWDER,Jerene Yeager, LCSW  Date/Time:  06/07/2014 03:40 PM  Referred by:  Physician  Date Referred:  06/05/2014 Referred for  Other - See comment   Other Referral:   ? [facility]   Interview type:  Other - See comment Other interview type:   Patient (confused most of the time)  Telephonic conversation with daughter Rachel Cantu    PSYCHOSOCIAL DATA Living Status:  HUSBAND Admitted from facility:   Level of care:   Primary support name:  Rachel Cantu   [phone] Primary support relationship to patient:  SPOUSE Degree of support available:   good support  Daughter- Rachel Cantu  [phone]  (lives in [place] but very invovled in her mother's care.    CURRENT CONCERNS Current Concerns  Other - See comment   Other Concerns:   Patient not at baseline mentally per daughter- does not recognize her daugher  Husband works full time    SOCIAL WORK ASSESSMENT / PLAN 64 year old female admitted from home where she lived with her husband Rachel Cantu. Per extensive telephone call with patient's daughter Rachel Cantu- her mother worked full time and was completly independent prior to this hospitalization. Daughter verbalized multiple concerns that her mother did not recognize her and this is not normal as she and her mother were always very close. Daughter feels that there are many unanswered questions and concerns about her mother's care.  Daughter states that she lives in [place] and is considering bringing her mother out to [place] for continued care. Briefly discussed possible SNF placement for rehab.  Daughter is agreeable to consider this option as well but wants answers to why her mother's health and mentation have deteriorated.   Assessment/plan status:  Psychosocial Support/Ongoing Assessment of  Needs Other assessment/ plan:   Information/referral to community resources:   SNF bed list will be left in patient's room for family to review    PATIENT'S/FAMILY'S RESPONSE TO PLAN OF CARE: Patient is alert and oriented to person but not to place, situation or time. She is able to speak clearly and is very responsive- however- her thoughts are  quite tangenital. Patient will respond to some questions clearly and then she seems to loose focus and will speak almost non-stop about various things that are not related.  Patient does not acknowlege that she has a daughter or a husband but she is very verbal about her perception of her current hospital situation.  CSW attempted to redirect patient multiple times with little success. Will need to follow up with patient's daughter and husband to determine next course of action.

## 2014-06-17 NOTE — Progress Notes (Signed)
Pre visit review using our clinic review tool, if applicable. No additional management support is needed unless otherwise documented below in the visit note.  Chief Complaint  Patient presents with  . Hospitalization Follow-up    HPI: Patient comes in today as follow up from hospitalization for a fibe w rvr and acute chf renal failure and LA thrombus on anticoagulation  With underlying ht  MVR  She has not been seen in office for over a year . She is here with her daughter and son-in-law who live in [place] and are here for a visit. She was discharged from the hospital March 2 and [place] [facility] [place]  Her case has been managed by cardioloy and she has post hsp fu on march 17th  Daughter states that she had acute change in her mental status and mental clarity  i in hospital around Feb 21st . Went back in afib and chr and then had    Hypotension and then  Changing in mental status .  Neurology  Saw her . At the daughter's request and it was felt that she could've had some showering effects causing mental status changes. Like a mini stroke. Daughter is concerned because ever since then her cognitive abilities seem to be very off her mind seems to be wandering. Daughter has to go back to [place] and has questions about health care power of attorney and designated party release which Ms. Ronny Flurry is signed.  ROS: See pertinent positives and negatives per HPI. No current chest pain shortness of breath can walk short distances has some leg edema no active bleeding hasn't been eating much but increasing in that area. She was not on digoxin near the end of her hospitalization was placed back on it. Has some questions about this. It may have been that her pulse was too slow at that time.  Past Medical History  Diagnosis Date  . Hypertension   . History of chickenpox   . S/P MVR (mitral valve replacement) 1994  . Rheumatic fever     as a child  . Heart murmur   . Atrial  fibrillation with RVR 05/29/2014    Archie Endo 05/29/2014    Family History  Problem Relation Age of Onset  . Heart disease Father     premature age 19  . Alzheimer's disease Mother     History   Social History  . Marital Status: Married    Spouse Name: N/A  . Number of Children: N/A  . Years of Education: N/A   Social History Main Topics  . Smoking status: Never Smoker   . Smokeless tobacco: Never Used  . Alcohol Use: No  . Drug Use: No  . Sexual Activity: Yes   Other Topics Concern  . None   Social History Narrative   hh of 2    G1P1   Divorced    Outpatient Encounter Prescriptions as of 06/17/2014  Medication Sig  . digoxin (LANOXIN) 0.125 MG tablet Take 1 tablet (0.125 mg total) by mouth daily.  . furosemide (LASIX) 40 MG tablet Take 1 tablet (40 mg total) by mouth daily.  . metoprolol (LOPRESSOR) 50 MG tablet Take 1 tablet (50 mg total) by mouth 2 (two) times daily.  . pantoprazole (PROTONIX) 40 MG tablet Take 1 tablet (40 mg total) by mouth daily.  . potassium chloride 20 MEQ TBCR Take 10 mEq by mouth daily.  Marland Kitchen warfarin (COUMADIN) 5 MG tablet Take 1 1/2 tablets (7.5  mg) MWF. Take 1 tablet (5 mg) Tue, Thurs, Sat, Sun    EXAM:  BP 100/70 mmHg  Temp(Src) 97.5 F (36.4 C) (Oral)  Ht _0  (1.626 m)  Wt 162 lb 9.6 oz (73.755 kg)  BMI 27.90 kg/m2  Body mass index is 27.9 kg/(m^2).  GENERAL: vitals reviewed and listed above, alert, o appears well hydrated and in no acute distress looks more clinical but I've ever seen her mildly wasted alert but although speech is clear her thought process seems to be rambling when asked a question she begins to answer it and then speaks of something totally unrelated HEENT: atraumatic, conjunctiva  clear, no obvious abnormalities on inspection of external nose and ears NECK: no obvious masses on inspection palpation  LUNGS: clear to auscultation bilaterally, no wheezes, rales or rhonchi,  CV: HRIRR rate about 64, no clubbing  cyanosis +1 peripheral edema nl cap refill  MS: moves all extremities without noticeable focal  Abnormality no trremor or abnormal movements   pleasant and cooperative, quiet for her  ASSESSMENT AND PLAN:  Discussed the following assessment and plan:  Hospital discharge follow-up  Altered mental status, unspecified altered mental status type - inhosp after low bp and afib event   Mechanical heart valve present  Anticoagulated on Coumadin  Acute on chronic combined systolic and diastolic ACC/AHA stage C congestive heart failure  S/P MVR (mitral valve replacement) I agree with daughter that since the last time I saw her over a year ago her mental status or cognitive status significantly different. She cannot remember the password to her main computer although she might do better with visual cues. Hopefully  this will improve over time. Advise consult with neuro rehabilitation if this is not already done daughter will check into a perhaps considering being seen out of state where she lives. Dependence on her final disposition. Patient is in significant rehabilitation attending physician there will have to direct care at this time. Otherwise. Other questions may need to be directed cardiology. Patient did sign a DRP for daughter and handout on advanced care and health care power of attorney is given to them today.  Total visit 50 mins > 50% spent counseling and coordinating care   Patient Instructions  Please discuss with cardiology questions about   Medications. Fluid pills   Can help heart failure sx .  Daily weight  Are importants to decide if increasing fluid retention.  Get health care power of attorney .  i advise neurology evaluation .           Standley Brooking. Panosh M.D. Below is summary of hospital and neurology consult. Hospital Course: 64 y.o. female, followed in the past by Dr. Harrington Challenger, w/ PMHx significant for rheumatic mitral dz s/p mech MVR on chronic coumadin and h/o HTN who  presented to [facility] on 05/29/2014 with complaints of 7 days of shortness of breath and palpitations. She presents with her husband. She is a rather difficult historian to follow and answers questions repetitively and tangentially goes off. In ER, she was found to be in atrial fibrillation w/ RVR in the 140s and also in acute decompensated HF. She developed respiratory distress and initially required BiPap. She was placed on IV cardizem as well as IV Lasix and was admitted. Cardiac enzymes were cycled and were minimally elevated with flat trend. 2D echo showed systolic dysfunction with EF of 20-25%. She denied chest pain. It was felt troponin elevation was secondary to demand ischemia in the  setting of atrial fibrillation w/ RVR and that her cardiomyopathy was also tachy induced. Her rate improved with IV Cardizem, however she continue in atrial fibrillation. Decision was made to attempt TEE guided DCCV. However, TEE demonstrated a large LAA thrombus. Her mechanical valve was ok. Due to LAA thrombus, DCCV was canceled. She was also not a candidate for Amiodarone due to thrombus. Decision was made to continue rate control with metoprolol. Digoxin was added for additional rate control. She was continued on Coumadin. Her atrial fibrillation was well controlled medically in the 80s resting. She diuresed well with IV Lasix. Symptoms improved. Also notable this admission, the patient was evaluated by head CT in the setting of suspected AMS and confusion. This was a normal study. She was assessed by PT who determined she would need 24 hr assistance at time of discharge. She was evaluated for inpatient rehab and felt not to be a candidate. Social work was consulted to assist patient and family with needs. Her husband works and cannot take care of her and all the bedrooms are upstairs. SNF placement was recommended and arranged. She was seen and examined by Dr. Radford Pax who felt she was stable for discharge to SNF.  Pharmacy recommended 7.5 mg of Coumadin MWF and 5 mg on other days. It was also recommended that her INR be checked on 06/14/14. Post-hospital f/u has been arranged with Richardson Dopp, PA-C on 06/27/14. It has been recommended that she get a repeat 2D echo in 3 months to reassess LVF.   Assessment/Plan: 64 year old female with altered mental status. Remaining neurological examination is non-focal. Patient admitted with atrial fibrillation. INR subtherapeutic at that time. Echocardiogram reveals an atrial thrombus and depressed LV function. Can not rule out the possibility that the mental status change may be secondary to a shower of emboli in this clinical scenario. Head CT from 2/21 personally reviewed and shows no acute changes. Atrophy is noted. Patient unable to have a MRI due to her prosthetic valve. Digoxin level normal at 1.0. No other possible offending medications noted in [facility]. No evidence of infection. Initial lab work did reveal elevated LFT's. Will repeat to rule out a hepatic etiology.   Recommendations: 1. Repeat head CT without contrast 2. ESR, CMET 3. Will continue to follow with you  Assessment/Plan: 64 year old female with altered mental status. Remaining neurological examination is non-focal. Patient admitted with atrial fibrillation. INR subtherapeutic at that time. Echocardiogram reveals an atrial thrombus and depressed LV function. Etiology for mental status unclear. Digoxin level normal at 1.0. Ammonia and ESR WNL. Liver functions improved. Repeat head CT personally reviewed and unchanged. Showering of emboli remains on the differential and at this point is likely. Can not MRI.  30 minutes spent in discussion with cardiology and daughter.      Alexis Goodell, MD [facility]

## 2014-06-17 NOTE — Patient Instructions (Signed)
Please discuss with cardiology questions about   Medications. Fluid pills   Can help heart failure sx .  Daily weight  Are importants to decide if increasing fluid retention.  Get health care power of attorney .  i advise neurology evaluation .

## 2014-06-18 ENCOUNTER — Encounter: Payer: Self-pay | Admitting: Internal Medicine

## 2014-06-18 ENCOUNTER — Non-Acute Institutional Stay (SKILLED_NURSING_FACILITY): Payer: Self-pay | Admitting: Internal Medicine

## 2014-06-18 DIAGNOSIS — I5043 Acute on chronic combined systolic (congestive) and diastolic (congestive) heart failure: Secondary | ICD-10-CM

## 2014-06-18 DIAGNOSIS — I480 Paroxysmal atrial fibrillation: Secondary | ICD-10-CM

## 2014-06-18 DIAGNOSIS — I4819 Other persistent atrial fibrillation: Secondary | ICD-10-CM | POA: Insufficient documentation

## 2014-06-18 DIAGNOSIS — R2990 Unspecified symptoms and signs involving the nervous system: Secondary | ICD-10-CM

## 2014-06-18 DIAGNOSIS — R4189 Other symptoms and signs involving cognitive functions and awareness: Secondary | ICD-10-CM | POA: Insufficient documentation

## 2014-06-18 DIAGNOSIS — I513 Intracardiac thrombosis, not elsewhere classified: Secondary | ICD-10-CM

## 2014-06-18 DIAGNOSIS — Z952 Presence of prosthetic heart valve: Secondary | ICD-10-CM

## 2014-06-18 DIAGNOSIS — I10 Essential (primary) hypertension: Secondary | ICD-10-CM

## 2014-06-18 DIAGNOSIS — Z8619 Personal history of other infectious and parasitic diseases: Secondary | ICD-10-CM

## 2014-06-18 DIAGNOSIS — K219 Gastro-esophageal reflux disease without esophagitis: Secondary | ICD-10-CM

## 2014-06-18 DIAGNOSIS — Z954 Presence of other heart-valve replacement: Secondary | ICD-10-CM

## 2014-06-18 DIAGNOSIS — I213 ST elevation (STEMI) myocardial infarction of unspecified site: Secondary | ICD-10-CM

## 2014-06-18 DIAGNOSIS — Z8679 Personal history of other diseases of the circulatory system: Secondary | ICD-10-CM

## 2014-06-18 NOTE — Progress Notes (Signed)
Patient ID: Rachel Cantu, female   DOB: 03-22-1951, 64 y.o.   MRN: [id number]    HISTORY AND PHYSICAL  Location:  [facility]    Place of Service: SNF [phone])   Extended Emergency Contact Information Primary Emergency Contact: Lindroth,William Address: [address]          [place], [place] Home Phone: [phone] Work Phone: [phone] Relation: Spouse Secondary Emergency Contact: Annabell Howells [place] Home Phone: [phone] Mobile Phone: [phone] Relation: Daughter  Advanced Directive information  FULL code  Chief Complaint  Patient presents with  . New Admit To SNF    left atrial thrombus, new onset afib w RVR, s/p MVR on coumadin, HTN, acute/chronic CHF, renal insufficency, elevated bilirubin, elevated Troponin, hx rheumatic fever    HPI:  64 yo female seen today as a new admission into SNF. She was dx with a large left atrial thrombus while on coumadin for MVR. She is unsure if she was therapeutic on coumadin. She had palpitations and SOB prior to hospitalization but that has improved and she no longer has palpitations. She will see cardio next week. Denies any bleeding or bruising. Her story is difficult to follow as she has tangential though and she never directly answers questions.   New onset afib with improved rate control on metoprolol and digoxin. She is not amiodarone candidate due to atrial thrombus.  BP stable on metoprolol. She has been diuresing on lasix for edema and heart failure. She has not been wearing TED stockings as instructed  GERD is stable on pantoprazole.  Past Medical History  Diagnosis Date  . Hypertension   . History of chickenpox   . S/P MVR (mitral valve replacement) 1994  . Rheumatic fever     as a child  . Heart murmur   . Atrial fibrillation with RVR 05/29/2014    Hattie Perch 05/29/2014    Past Surgical History  Procedure Laterality Date  . Mitral valve  replacement  1994  . Tee without cardioversion  05/30/2014  . Tee without cardioversion N/A 05/30/2014    Procedure: TRANSESOPHAGEAL ECHOCARDIOGRAM (TEE);  Surgeon: Lewayne Bunting, MD;  [facility]: [facility] ENDOSCOPY;  Service: Cardiovascular;  Laterality: N/A;    Patient Care Team: Madelin Headings, MD as PCP - General Pricilla Riffle, MD (Cardiology) Genia Del, MD as Attending Physician (Obstetrics and Gynecology)  History   Social History  . Marital Status: Married    Spouse Name: N/A  . Number of Children: N/A  . Years of Education: N/A   Occupational History  . Not on file.   Social History Main Topics  . Smoking status: Never Smoker   . Smokeless tobacco: Never Used  . Alcohol Use: No  . Drug Use: No  . Sexual Activity: Yes   Other Topics Concern  . Not on file   Social History Narrative   hh of 2    G1P1   Divorced     reports that she has never smoked. She has never used smokeless tobacco. She reports that she does not drink alcohol or use illicit drugs.  Family History  Problem Relation Age of Onset  . Heart disease Father     premature age 2  . Alzheimer's disease Mother    No family status information on file.    Immunization History  Administered Date(s) Administered  . Influenza Split 01/20/2011  . Influenza Whole 01/16/2008, 02/10/2009, 03/13/2010  . Influenza,  High Dose Seasonal PF 04/22/2014  . Influenza,inj,Quad PF,36+ Mos 12/18/2012  . Pneumococcal Conjugate-13 04/13/2004    Allergies  Allergen Reactions  . Sulfamethoxazole     REACTION: unspecified    Medications: Patient's Medications  New Prescriptions   No medications on file  Previous Medications   DIGOXIN (LANOXIN) 0.125 MG TABLET    Take 1 tablet (0.125 mg total) by mouth daily.   FUROSEMIDE (LASIX) 40 MG TABLET    Take 1 tablet (40 mg total) by mouth daily.   METOPROLOL (LOPRESSOR) 50 MG TABLET    Take 1 tablet (50 mg total) by mouth 2 (two) times daily.   PANTOPRAZOLE  (PROTONIX) 40 MG TABLET    Take 1 tablet (40 mg total) by mouth daily.   POTASSIUM CHLORIDE 20 MEQ TBCR    Take 10 mEq by mouth daily.   WARFARIN (COUMADIN) 5 MG TABLET    Take 1 1/2 tablets (7.5 mg) MWF. Take 1 tablet (5 mg) Tue, Thurs, Sat, Sun  Modified Medications   No medications on file  Discontinued Medications   No medications on file    Review of Systems  Constitutional: Positive for fatigue. Negative for fever, chills, diaphoresis, activity change and appetite change.  HENT: Negative for ear pain and sore throat.   Eyes: Negative for visual disturbance.  Respiratory: Positive for shortness of breath. Negative for cough and chest tightness.   Cardiovascular: Positive for leg swelling. Negative for chest pain and palpitations.  Gastrointestinal: Negative for nausea, vomiting, abdominal pain, diarrhea, constipation and blood in stool.  Genitourinary: Negative for dysuria.  Musculoskeletal: Negative for arthralgias.  Neurological: Negative for dizziness, tremors, numbness and headaches.  Psychiatric/Behavioral: Negative for behavioral problems and sleep disturbance. The patient is nervous/anxious.     Filed Vitals:   06/18/14 1542  BP: 105/51  Pulse: 95  Temp: 97.7 F (36.5 C)  Weight: 161 lb (73.029 kg)  SpO2: 98%   Body mass index is 27.62 kg/(m^2).  Physical Exam  Constitutional: She appears well-developed and well-nourished. No distress.  Looks well in NAD. No conversational dyspnea. Awake and alert. Unable to assess orientation due to her tangential speech  HENT:  Mouth/Throat: Oropharynx is clear and moist. No oropharyngeal exudate.  Eyes: Pupils are equal, round, and reactive to light. No scleral icterus.  Neck: Neck supple. No JVD present. No tracheal deviation present. No thyromegaly present.  Cardiovascular: Normal rate.  An irregularly irregular rhythm present. Exam reveals gallop. Exam reveals no friction rub.   Murmur heard.  Systolic murmur is present  with a grade of 1/6  +2 pitting LE edema b/l. no calf TTP. No carotid bruit b/l  Pulmonary/Chest: Effort normal and breath sounds normal. No stridor. No respiratory distress. She has no wheezes. She has no rales.  Abdominal: Soft. Bowel sounds are normal. She exhibits no distension and no mass. There is no tenderness. There is no rebound and no guarding.  Lymphadenopathy:    She has no cervical adenopathy.  Neurological: She is alert. She has normal reflexes.  Skin: Skin is warm and dry. No rash noted.  Psychiatric: She has a normal mood and affect. Her behavior is normal. Judgment normal. Her speech is tangential. Thought content is not paranoid and not delusional.  Thought is tangential     Labs reviewed: Admission on 05/28/2014, Discharged on 06/12/2014  No results displayed because visit has over 200 results.    Anti-coag visit on 04/26/2014  Component Date Value Ref Range Status  . INR  04/26/2014 3.7   Final  Anti-coag visit on 03/29/2014  Component Date Value Ref Range Status  . INR 03/29/2014 3.5   Final   CBC Latest Ref Rng 06/08/2014 06/05/2014 06/04/2014  WBC 4.0 - 10.5 K/uL 7.2 8.0 7.0  Hemoglobin 12.0 - 15.0 g/dL 16.115.0 09.614.0 04.513.6  Hematocrit 36.0 - 46.0 % 43.9 42.4 41.1  Platelets 150 - 400 K/uL 173 169 140(L)    CMP Latest Ref Rng 06/12/2014 06/08/2014 06/06/2014  Glucose 70 - 99 mg/dL 409(W185(H) 119(J123(H) 478(G132(H)  BUN 6 - 23 mg/dL 95(A24(H) 21(H24(H) 21  Creatinine 0.50 - 1.10 mg/dL 0.860.98 5.780.87 4.690.88  Sodium 135 - 145 mmol/L 138 135 137  Potassium 3.5 - 5.1 mmol/L 4.1 4.4 4.4  Chloride 96 - 112 mmol/L 105 106 101  CO2 19 - 32 mmol/L 28 18(L) 21  Calcium 8.4 - 10.5 mg/dL 9.1 8.4 9.0  Total Protein 6.0 - 8.3 g/dL - - 6.1  Total Bilirubin 0.3 - 1.2 mg/dL - - 1.3(H)  Alkaline Phos 39 - 117 U/L - - 134(H)  AST 0 - 37 U/L - - 31  ALT 0 - 35 U/L - - 36(H)      Dg Chest 1 View  06/02/2014   CLINICAL DATA:  Shortness of breath for 5 days  EXAM: CHEST  1 VIEW  COMPARISON:  05/28/2014   FINDINGS: Previous median sternotomy and mitral valve repair. The heart size is enlarged. There are small pleural effusions and mild interstitial edema.  IMPRESSION: 1. Congestive heart failure.   Electronically Signed   By: Signa Kellaylor  Stroud M.D.   On: 06/02/2014 12:23   Ct Head Wo Contrast  06/06/2014   CLINICAL DATA:  64 year old female with altered mental status, confusion. Initial encounter.  EXAM: CT HEAD WITHOUT CONTRAST  TECHNIQUE: Contiguous axial images were obtained from the base of the skull through the vertex without intravenous contrast.  COMPARISON:  Head CT without contrast 06/02/2014.  FINDINGS: Visualized paranasal sinuses and mastoids are clear. No acute osseous abnormality identified. Dysconjugate gaze, otherwise negative orbits soft tissues.  Negative scalp soft tissues except for superficial soft tissue irregularity at the left vertex seen on series 4, image 65.  Calcified atherosclerosis at the skull base. Stable right greater than left PCA territory encephalomalacia which appears to be chronic. Small chronic appearing cerebellar lacunar infarcts re- identified. Moderate patchy white matter hypodensity elsewhere is nonspecific. No ventriculomegaly. No midline shift, mass effect, or evidence of intracranial mass lesion. No suspicious intracranial vascular hyperdensity. No acute cortically based infarct identified. No acute intracranial hemorrhage identified.  IMPRESSION: 1.  No acute intracranial abnormality identified. 2. Stable and chronic appearing bilateral PCA and cerebellar ischemia. 3. Superficial/dermal scalp soft tissue lesion at the left vertex, see series 4, image 65.   Electronically Signed   By: Odessa FlemingH  Hall M.D.   On: 06/06/2014 19:41   Ct Head Wo Contrast  06/03/2014   CLINICAL DATA:  Mental status change.  New onset confusion.  EXAM: CT HEAD WITHOUT CONTRAST  TECHNIQUE: Contiguous axial images were obtained from the base of the skull through the vertex without intravenous  contrast.  COMPARISON:  None.  FINDINGS: Generalized atrophy and chronic small vessel ischemic change. Encephalomalacia in the right parietal-occipital lobe consistent prior infarct. No intracranial hemorrhage, mass effect, or midline shift. No hydrocephalus. The basilar cisterns are patent. No evidence of acute territorial infarct. No intracranial fluid collection. Calvarium is intact. Included paranasal sinuses and mastoid air cells are well aerated.  IMPRESSION: Atrophy,  chronic small vessel ischemia, and remote prior infarct. No CT findings of acute intracranial abnormality.   Electronically Signed   By: Rubye Oaks M.D.   On: 06/03/2014 01:26   US Renal  05/29/2014   CLINICAL DATA:  Renal failure.  Hypertension  EXAM: RENAL/URINARY TRACT ULTRASOUND COMPLETE  COMPARISON:  None.  FINDINGS: Right Kidney:  Length: 10.1 cm. Echogenicity within normal limits. No mass or hydronephrosis visualized.  Left Kidney:  Length: 11.3 cm. Echogenicity within normal limits. No mass or hydronephrosis visualized.  Bladder:  Appears normal for degree of bladder distention.  Other:  Small right pleural effusion identified.  IMPRESSION: 1. Normal appearance of the kidneys. No explanation for patient's renal failure.   Electronically Signed   By: Signa Kell M.D.   On: 05/29/2014 10:45   Dg Chest Port 1 View  06/09/2014   CLINICAL DATA:  Shortness of breath  EXAM: PORTABLE CHEST - 1 VIEW  COMPARISON:  06/02/2014  FINDINGS: Cardiac shadow remains enlarged. Postsurgical changes are again identified. Mild vascular congestion is again identified and stable. Persistent right basilar density is seen likely representing atelectasis. A small effusion is noted as well.  IMPRESSION: Persistent vascular congestion and changes in the right base. No other focal abnormality is noted.   Electronically Signed   By: Alcide Clever M.D.   On: 06/09/2014 20:30   Dg Chest Portable 1 View  05/28/2014   CLINICAL DATA:  Dyspnea.  Atrial  fibrillation.  EXAM: PORTABLE CHEST - 1 VIEW  COMPARISON:  None  FINDINGS: There is marked cardiomegaly. There is prior sternotomy and valvuloplasty. Extensive vascular and interstitial congestion is present, without focal airspace consolidation. No large effusion is evident.  IMPRESSION: Marked cardiomegaly.  Probable mild congestive heart failure.   Electronically Signed   By: Ellery Plunk M.D.   On: 05/28/2014 23:46   Hospital records reviewed. TEE revealed EF 20-25% along with large left atrial thrombus. No cardioversion done due to atrial thrombus. She is not a cndidate for amiodarone due to atrial thrombus. She was d/c'd on digoxin and metoprolol  Assessment/Plan   ICD-9-CM ICD-10-CM   1. 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 S/P MVR (mitral valve replacement) due to rheumatic fever- on coumadin V43.3 Z95.4   3. 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 Paroxysmal atrial fibrillation - rate controlled on metoprolol and digoxin 427.31 I48.0   6. Essential hypertension - stable on metoprolol 401.9 I10   7. History of rheumatic fever s/p MVR V12.09 Z86.19   8. GERD stable on pantoprazole  --psych referral to evaluate tangential though/speech  --INR goal 3-3.5 in light of atrial thrombosis while on coumadin  --PT/OT/ST as indicated  --continue current medications as ordered  --f/u with cardiology next week as scheduled. Repeat 2D echo in 3 mos  --check CMP in 3/14th to follow bilirubin and LFTs as well as renal fxn  --wear TED stockings daily to reduce fluid retention. Keep legs elevated when seated  --will follow  --GOAL: short term rehab and d/c home when medically appropriate. Communicated with pt and nursing.   Demosthenes Virnig S. Ancil Linsey  [facility] [address] [place], [place] [address] [phone] Office (Wednesdays and Fridays 8 AM -  5 PM) [phone] Cell (Monday-Friday 8 AM - 5 PM)

## 2014-06-21 ENCOUNTER — Telehealth: Payer: Self-pay | Admitting: Internal Medicine

## 2014-06-21 DIAGNOSIS — R2990 Unspecified symptoms and signs involving the nervous system: Secondary | ICD-10-CM

## 2014-06-21 DIAGNOSIS — Z952 Presence of prosthetic heart valve: Secondary | ICD-10-CM

## 2014-06-21 DIAGNOSIS — R4189 Other symptoms and signs involving cognitive functions and awareness: Secondary | ICD-10-CM

## 2014-06-21 DIAGNOSIS — I4891 Unspecified atrial fibrillation: Secondary | ICD-10-CM

## 2014-06-21 NOTE — Telephone Encounter (Signed)
Order placed in the system. 

## 2014-06-21 NOTE — Telephone Encounter (Signed)
Pt daughter called to say that she has reached out to [facility] as per Dr Fabian SharpPanosh request   She called to request a referral and need it to be [facility] so that thay can see her at there next available appt. If not [facility] they will not be able to see her till May

## 2014-06-27 ENCOUNTER — Encounter: Payer: Self-pay | Admitting: Physician Assistant

## 2014-06-27 ENCOUNTER — Telehealth: Payer: Self-pay | Admitting: Physician Assistant

## 2014-06-27 ENCOUNTER — Ambulatory Visit (INDEPENDENT_AMBULATORY_CARE_PROVIDER_SITE_OTHER): Payer: Self-pay | Admitting: Physician Assistant

## 2014-06-27 VITALS — BP 120/74 | HR 74 | Ht 64.0 in | Wt 156.0 lb

## 2014-06-27 DIAGNOSIS — I10 Essential (primary) hypertension: Secondary | ICD-10-CM

## 2014-06-27 DIAGNOSIS — I429 Cardiomyopathy, unspecified: Secondary | ICD-10-CM

## 2014-06-27 DIAGNOSIS — I5022 Chronic systolic (congestive) heart failure: Secondary | ICD-10-CM

## 2014-06-27 DIAGNOSIS — I481 Persistent atrial fibrillation: Secondary | ICD-10-CM

## 2014-06-27 DIAGNOSIS — R4182 Altered mental status, unspecified: Secondary | ICD-10-CM

## 2014-06-27 DIAGNOSIS — Z952 Presence of prosthetic heart valve: Secondary | ICD-10-CM

## 2014-06-27 DIAGNOSIS — I4819 Other persistent atrial fibrillation: Secondary | ICD-10-CM

## 2014-06-27 DIAGNOSIS — Z954 Presence of other heart-valve replacement: Secondary | ICD-10-CM

## 2014-06-27 MED ORDER — POTASSIUM CHLORIDE ER 20 MEQ PO TBCR: 20.0000 meq | EXTENDED_RELEASE_TABLET | Freq: Every day | ORAL | Status: DC

## 2014-06-27 MED ORDER — FUROSEMIDE 40 MG PO TABS: 60.0000 mg | ORAL_TABLET | Freq: Every day | ORAL | Status: DC

## 2014-06-27 NOTE — Progress Notes (Signed)
Cardiology Office Note   Date:  06/27/2014   ID:  Rachel Cantu, DOB 1950-04-27, MRN [id number]  PCP:  Lorretta HarpPANOSH,WANDA KOTVAN, MD  Cardiologist:  Dr. Tobias AlexanderKatarina Nelson      Chief Complaint  Patient presents with  . Atrial Fibrillation  . Congestive Heart Failure  . Hospitalization Follow-up     History of Present Illness: Rachel Cantu is a 64 y.o. female with a hx of MV disease s/p [employer] [place] in [place], HTN.  Last seen by Dr. Dietrich PatesPaula Ross 08/2010.    Admitted 2/16-3/2 with volume overload, elevated Troponins and AKI in the setting of AFib with RVR.  TEE-DCCV was planned but TEE demonstrated LAA clot.  Echo demonstrated reduced LVF with EF 20-25% and mod RV dysfunction.  LV dysfunction was felt to be related to tachycardia, but she would need ischemic evaluation if her EF does not improve with rate control/NSR.  Rate control was adjusted.  Elevated Troponin was thought to be 2/2 demand ischemia. Diltiazem was initially used and then DC'd 2/2 low EF.  HR control was problematic (no Amiodarone due to LAA clot) and she developed worsening pulmonary edema.  She was followed by CCM as well in the ICU.  Respiratory status improved with diuresis.  Hospital course was complicated by ongoing confusion.  She was seen by Neurology.  Head CT was done and repeated without change.  MRI could not be done due to mechanical MVR.  It was suspected she had showering of emboli to her brain to account for MS changes.  She was ultimately DC to SNF.  She returns for FU.  [facility] is [facility] at [facility].  She is here today with her husband.  Her daughter is also on speaker phone.  She is working with PT and she feels pretty good.  She denies significant DOE.  She sleeps on 2-3 pillows.  She denies PND.  She denies chest pain, dizziness, syncope, palpitations.  She notes LE edema.  This is overall stable.  She denies cough or wheezing.     Studies/Reports Reviewed Today:  Echo 05/29/14 -  Mild focal basal hypertrophy of the septum. EF 20-25%. Diffuse hypokinesis. - Ventricular septum: Septal motion showed paradox. - Aortic valve: There was mild regurgitation. - Mitral valve: A mechanical prosthesis was present and functioning normally. The prosthesis had a normal range of motion. The sewing ring appeared normal, had no rocking motion, and showed no evidence of dehiscence. - Left atrium: The atrium was moderately dilated. - Right ventricle: The cavity size was moderately dilated. Wall thickness was normal. Systolic function was moderately reduced. - Right atrium: The atrium was moderately dilated. - Tricuspid valve: There was moderate regurgitation. - PA peak pressure: 44 mm Hg (S).   TEE 05/30/14 - EF 25-30%.  Diffuse hypokinesis. - Aortic valve: No evidence of vegetation. There was trivial regurgitation. - Mitral valve: A mechanical prosthesis was present. There was mild regurgitation. - Left atrium: The atrium was moderately dilated. There was an apparent, large thrombusin the appendage. There was mild spontaneous echo contrast (&quot;smoke&quot;) in the cavity. - Right ventricle: The cavity size was mildly dilated. Systolic function was severely reduced. - Right atrium: The atrium was mildly dilated. No evidence of thrombus in the atrial cavity or appendage. - Atrial septum: No defect or patent foramen ovale was identified. - Tricuspid valve: No evidence of vegetation. There was   moderate-severe regurgitation. - Pulmonic valve: No evidence of vegetation.  Past Medical History  Diagnosis Date  . Hypertension   . History of chickenpox   . S/P MVR (mitral valve replacement) 1994  . Rheumatic fever     as a child  . Heart murmur   . Atrial fibrillation with RVR 05/29/2014    Hattie Perch 05/29/2014    Past Surgical History  Procedure Laterality Date  . Mitral valve replacement  1994  . Tee without cardioversion  05/30/2014  . Tee without cardioversion N/A 05/30/2014     Procedure: TRANSESOPHAGEAL ECHOCARDIOGRAM (TEE);  Surgeon: Lewayne Bunting, MD;  Location: [facility] ENDOSCOPY;  Service: Cardiovascular;  Laterality: N/A;     Current Outpatient Prescriptions  Medication Sig Dispense Refill  . digoxin (LANOXIN) 0.125 MG tablet Take 1 tablet (0.125 mg total) by mouth daily. 30 tablet 5  . furosemide (LASIX) 40 MG tablet Take 1 tablet (40 mg total) by mouth daily. 30 tablet 5  . metoprolol (LOPRESSOR) 50 MG tablet Take 1 tablet (50 mg total) by mouth 2 (two) times daily. 60 tablet 5  . pantoprazole (PROTONIX) 40 MG tablet Take 1 tablet (40 mg total) by mouth daily. 30 tablet 5  . potassium chloride 20 MEQ TBCR Take 10 mEq by mouth daily. 30 tablet 5  . warfarin (COUMADIN) 5 MG tablet Take 1 1/2 tablets (7.5 mg) MWF. Take 1 tablet (5 mg) Tue, Thurs, Sat, Sun 60 tablet 5   No current facility-administered medications for this visit.    Allergies:   Sulfamethoxazole    Social History:  The patient  reports that she has never smoked. She has never used smokeless tobacco. She reports that she does not drink alcohol or use illicit drugs.   Family History:  The patient's family history includes Alzheimer's disease in her mother; Heart disease in her father. There is no history of Heart attack or Stroke.    ROS:   Please see the history of present illness.   Review of Systems  Constitution: Negative for fever.  Respiratory: Negative for cough.   Gastrointestinal: Negative for diarrhea, hematochezia, melena and vomiting.  Genitourinary: Negative for hematuria.  All other systems reviewed and are negative.    PHYSICAL EXAM: VS:  BP 120/74 mmHg  Pulse 74  Ht  (1.626 m)  Wt 156 lb (70.761 kg)  BMI 26.76 kg/m2    Wt Readings from Last 3 Encounters:  06/18/14 161 lb (73.029 kg)  06/17/14 162 lb 9.6 oz (73.755 kg)  06/12/14 159 lb 2.1 oz (72.18 kg)     GEN: Well nourished, well developed, in no acute distress HEENT: normal Neck: + JVD, no  masses Cardiac:  Normal S1/Mechanical S2, irreg irreg rhythm; no murmur ,  no rubs or gallops, 1+ edema  Respiratory:  clear to auscultation bilaterally, no wheezing, rhonchi or rales. GI: soft, nontender, nondistended, + BS MS: no deformity or atrophy Skin: warm and dry  Neuro:  CNs II-XII intact, Strength and sensation are intact Psych: Normal affect   EKG:  EKG is ordered today.  It demonstrates:   AFib, HR 74, inf-lat TWI   Recent Labs: 05/29/2014: TSH 1.494 06/06/2014: ALT 36* 06/08/2014: Hemoglobin 15.0; Platelets 173 06/12/2014: BUN 24*; Creatinine 0.98; Potassium 4.1; Sodium 138    Lipid Panel    Component Value Date/Time   CHOL 244* 11/23/2011 0849   TRIG 167.0* 11/23/2011 0849   HDL 54.40 11/23/2011 0849   CHOLHDL 4 11/23/2011 0849   VLDL 33.4 11/23/2011 0849   LDLCALC 115* 06/13/2007 1128  LDLDIRECT 167.7 11/23/2011 0849      ASSESSMENT AND PLAN:  Persistent atrial fibrillation She remains in AFib with controlled VR.  [facility]  She had a large LAA clot at TEE in the hospital and therefore no DCCV was done.  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  Restoring NSR would likely be the most beneficial.  We need to confirm her INR is 2.5-3.5 for at least 4 weeks before attempting TEE-DCCV again.    -  Check INR weekly and send results here.    -  Continue current dose of beta-blocker and digoxin.    -  Check Digoxin level.  Chronic systolic heart failure She appears to still be s/w volume overloaded.  I will try to advance her diuresis more.    -  Increase Lasix to 60 mg QD and K+ 20 mEq QD.    -  BMET tomorrow with Dig level (to be done at [facility]) and BMET in 1 week.  Cardiomyopathy Likely tachycardia mediated.  If EF remains low after rate control/NSR, consider ischemic evaluation.  As noted, Lasix is being adjusted.  Will monitor renal function closely.  If renal function remains stable at FU, consider adding ACE inhibitor.    Essential hypertension Controlled.   S/P MVR (mitral valve replacement) Reviewed with Dr. Tobias Alexander after the patient left the office.  She should be on ASA in addition to coumadin with a mechanical valve prosthesis.      -  Will call the SNF and start ASA 81 mg QD.    -  Coumadin managed by SNF.  This will be managed by PCP after DC.  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  Patient's daughter questioned why an MRI was not done.  She was under the impression that her valve was MRI compatible.  I did some research after the patient left the office and reviewed with some of the physicians in the office.  Overall, MRIs are generally felt to be safe in patient's with mechanical heart valves.  However, at this point, an MRI would not likely change her outcome or treatment.  At this time, I would not recommend proceeding with an MRI.   Current medicines are reviewed at length with the patient today.  The patient's family has questions and concerns regarding medicines.  The following changes have been made:  As above.    Labs/ tests ordered today include:   Orders Placed This Encounter  Procedures  . EKG 12-Lead    Disposition:   FU with Dr. Tobias Alexander 3-4 weeks.  Of note, 60 minutes was spent with the patient and her family answering questions, counseling and medical decision making.    Signed, Brynda Rim, MHS 06/27/2014 3:17 PM    [facility] [address] [place], [place], [place]  [address] Phone: [phone]; Fax: [phone]

## 2014-06-27 NOTE — Telephone Encounter (Signed)
Reviewed with Dr. Tobias AlexanderKatarina Nelson. We reviewed current recommendations for mechanical heart valves.  She should be on ASA in addition to Coumadin for stroke prophylaxis. Goal INR is 2.5-3.5. Please start ASA 81 mg QD. Tereso NewcomerScott Malique Driskill, PA-C   06/27/2014 5:24 PM

## 2014-06-27 NOTE — Patient Instructions (Addendum)
1. INCREASE LASIX TO 60 MG DAILY  2. INCREASE POTASSIUM 20 MEQ DAILY  3. STOP PROTONIX  LAB WORK TOMORROW 06/28/14 PER SCOTT WEAVER, PAC BMET AND DIGOXIN LEVEL (TO BE DONE BEFORE TAKING DIGOXIN); FAX RESULTS TO Lowe's CompaniesSCOTT WEAVER, PAC [phone]  LAB WORK IN 1 WEEK; BMET; FAX RESULTS TO SCOTT WEAVER, PAC [phone]  PER SCOTT WEAVER, PAC TO HAVE [facility] CHECK INR WEEKLY WITH RESULTS FAXED OVER TO DR. Delton SeeNELSON OR SCOTT WEAVER, PAC [phone]   FOLLOW UP WITH DR. Delton SeeNELSON IN 3-4 WEEKS

## 2014-06-28 MED ORDER — ASPIRIN EC 81 MG PO TBEC: 81.0000 mg | DELAYED_RELEASE_TABLET | Freq: Every day | ORAL | Status: DC

## 2014-06-28 NOTE — Telephone Encounter (Signed)
I s/w Dois DavenportSandra, [facility]. RN notified per Tereso NewcomerScott Weaver, PA and Dr. Delton SeeNelson to have pt start ASA 81 mg along with coumadin for stroke prophylaxis. I will fax this note to Roetta SessionsSandra tonight to [phone].

## 2014-06-28 NOTE — Addendum Note (Signed)
Addended by: Tarri FullerFIATO, Zandon Talton M on: 06/28/2014 05:49 PM   Modules accepted: Orders

## 2014-07-01 ENCOUNTER — Encounter: Payer: Self-pay | Admitting: Neurology

## 2014-07-01 ENCOUNTER — Ambulatory Visit (INDEPENDENT_AMBULATORY_CARE_PROVIDER_SITE_OTHER): Payer: Medicare Other | Admitting: Neurology

## 2014-07-01 ENCOUNTER — Telehealth: Payer: Self-pay | Admitting: *Deleted

## 2014-07-01 VITALS — BP 106/64 | HR 57 | Resp 16 | Ht 65.0 in | Wt 157.0 lb

## 2014-07-01 DIAGNOSIS — F0151 Vascular dementia with behavioral disturbance: Secondary | ICD-10-CM

## 2014-07-01 MED ORDER — DONEPEZIL HCL 5 MG PO TABS: 5.0000 mg | ORAL_TABLET | Freq: Every day | ORAL | Status: DC

## 2014-07-01 NOTE — Progress Notes (Addendum)
NEUROLOGY CONSULTATION NOTE  Rachel DumasJanice E Cantu MRN: [id number] DOB: [birth year]  Referring provider: Dr. Fabian SharpPanosh Primary care provider: Dr. Fabian SharpPanosh  Reason for consult:  Cognitive impairment.  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  Records, labs, echo reports and CT of head reviewed.  She is accompanied by her daughter who provides most of the history.  On 05/28/14, she was [facility] with respiratory distress and palpitations.  She was found to be in atrial fibrillation with RVR in 140s, as well as acute decompensated heart failure.  2D echo showed EF 20-25%.  Troponins were mildly elevated and though to be secondary to demand ischemia in setting of atrial fibrillation with RVR.  TEE showed large LA thrombus.  Her rate was controlled with metoprolol and Digoxin.  She was continued on Coumadin.  She was discharged to SNF, as she required 24 hour assistance.  She had an episode of dyspnea that required sedation and monitoring on the ICU.  After she woke up, she acted strange.  Her [place] in from [place] to see her.  The patient appeared not to recognize her.  She also seemed unconcerned and was tangential in speech.  She was also asking about her mother who is deceased.  At one point, she referred to her daughter by a different name.  At first, it was thought to be secondary to coming out of sedation.  Although she had improved, she was far from baseline.  To assess altered mental status, CT of head was performed on 06/03/14, which showed atrophy and chronic small vessel disease with remote right greater than left PCA territory ischemia involving parietal-occipital lobes and cerebellum.  Repeat imaging on 06/06/14 was unchanged.  She is unable to have an MRI due to mechanical valve.    She was discharged to a SNF.   She has a tendency to wander.  Currently, she does not remember her passwords to her various financial files on the computer.  She is able to dress and bathe herself but has required some assistance in the SNF due to physical limitations.  She has a poor appetite.  She is not combative.  She is scheduled to be discharged home tomorrow.    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  She has a master's degree.  She lives with her husband.  Prior to her hospitalization, she managed her own finances.   Her mother had Alzheimer's.  PAST MEDICAL HISTORY: Past Medical History  Diagnosis Date  . Hypertension   . History of chickenpox   . S/P MVR (mitral valve replacement) 1994  . Rheumatic fever     as a child  . Heart murmur   . Atrial fibrillation with RVR 05/29/2014    Hattie Perch/notes 05/29/2014    PAST SURGICAL HISTORY: Past Surgical History  Procedure Laterality Date  . Mitral valve replacement  1994  . Tee without cardioversion  05/30/2014  . Tee without cardioversion N/A 05/30/2014    Procedure: TRANSESOPHAGEAL ECHOCARDIOGRAM (TEE);  Surgeon: Lewayne BuntingBrian S Crenshaw, MD;  Location[facility] END[facility]COPY;  Service: Cardiovascular;  Laterality: N/A;    MEDICATIONS: Current Outpatient Prescriptions on File Prior to Visit  Medication Sig Dispense Refill  . digoxin (LANOXIN) 0.125 MG tablet Take 1 tablet (0.125 mg total) by mouth daily. 30 tablet 5  . furosemide (  LASIX) 40 MG tablet Take 1.5 tablets (60 mg total) by mouth daily.    . metoprolol (LOPRESSOR) 50 MG tablet Take 1 tablet (50 mg total) by mouth 2 (two) times daily. 60 tablet 5  . Potassium Chloride ER 20 MEQ TBCR Take 20 mEq by mouth daily.    Marland Kitchen warfarin (COUMADIN) 5 MG tablet Take 1 1/2 tablets (7.5 mg) MWF. Take 1 tablet (5 mg) Tue, Thurs, Sat, Sun (Patient taking differently: Take 1 & 1/2 Tablets daily) 60 tablet 5   No current facility-administered medications on file prior to visit.    ALLERGIES: Allergies  Allergen Reactions   . Sulfamethoxazole     REACTION: unspecified    FAMILY HISTORY: Family History  Problem Relation Age of Onset  . Heart disease Father     premature age 63  . Alzheimer's disease Mother   . Heart attack Neg Hx   . Stroke Neg Hx     SOCIAL HISTORY: History   Social History  . Marital Status: Married    Spouse Name: N/A  . Number of Children: N/A  . Years of Education: N/A   Occupational History  . Not on file.   Social History Main Topics  . Smoking status: Never Smoker   . Smokeless tobacco: Never Used  . Alcohol Use: No  . Drug Use: No  . Sexual Activity: Yes   Other Topics Concern  . Not on file   Social History Narrative   hh of 2    G1P1   Divorced    REVIEW OF SYSTEMS: Constitutional: No fevers, chills, or sweats, no generalized fatigue, change in appetite Eyes: No visual changes, double vision, eye pain Ear, nose and throat: No hearing loss, ear pain, nasal congestion, sore throat Cardiovascular: No chest pain, palpitations Respiratory:  No shortness of breath at rest or with exertion, wheezes GastrointestinaI: No nausea, vomiting, diarrhea, abdominal pain, fecal incontinence Genitourinary:  No dysuria, urinary retention or frequency Musculoskeletal:  No neck pain, back pain Integumentary: No rash, pruritus, skin lesions Neurological: as above Psychiatric: No depression, insomnia, anxiety Endocrine: No palpitations, fatigue, diaphoresis, mood swings, change in appetite, change in weight, increased thirst Hematologic/Lymphatic:  No anemia, purpura, petechiae. Allergic/Immunologic: no itchy/runny eyes, nasal congestion, recent allergic reactions, rashes  PHYSICAL EXAM: Filed Vitals:   07/01/14 1308  BP: 106/64  Pulse: 57  Resp: 16   General: No acute distress Head:  Normocephalic/atraumatic Eyes:  fundi unremarkable, without vessel changes, exudates, hemorrhages or papilledema. Neck: supple, no paraspinal tenderness, full range of motion Back:  No paraspinal tenderness Heart: regular rate, irregular rhythm Lungs: Clear to auscultation bilaterally. Vascular: No carotid bruits. Neurological Exam: Mental status: alert and oriented to person, place, day and year (not date or month), recent memory poor, remote memory fair, fund of knowledge fair, attention and concentration poor, speech fluent and not dysarthric, language intact. Montreal Cognitive Assessment  07/01/2014  Visuospatial/ Executive (0/5) 1  Naming (0/3) 3  Attention: Read list of digits (0/2) 0  Attention: Read list of letters (0/1) 0  Attention: Serial 7 subtraction starting at 100 (0/3) 0  Language: Repeat phrase (0/2) 2  Language : Fluency (0/1) 0  Abstraction (0/2) 0  Delayed Recall (0/5) 0  Orientation (0/6) 4  Total 10   Cranial nerves: CN I: not tested CN II: pupils equal, round and reactive to light, visual fields intact, fundi unremarkable, without vessel changes, exudates, hemorrhages or papilledema. CN III, IV, VI:  full range of motion, no  nystagmus, no ptosis CN V: facial sensation intact CN VII: upper and lower face symmetric CN VIII: hearing intact CN IX, X: gag intact, uvula midline CN XI: sternocleidomastoid and trapezius muscles intact CN XII: tongue midline Bulk & Tone: normal, no fasciculations. Motor:  5/5 throughout Sensation:  Temperature and vibration intact Deep Tendon Reflexes:  2+ throughout, toes downgoing Finger to nose testing:  No dysmetria Heel to shin:  No dysmetria Gait:  Wide based gait.  Difficulty with walking in tandem. Romberg negative.  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 Given the atrophy out of proportion to age on CT, possible underlying neurodegenerative dementia is possible.  PLAN: 1.  Given the possibility of underlying neurodegenerative process, will start Aricept .  They should contact us in 4 weeks with update and we can then increase to   daily. 2.  Refer for neuropsychological testing 3.  Home Health safety assessment. 4.  Should not drive. 5.  Needs somebody to make sure she takes her medications correctly. 6.  On Coumadin 7.  Follow up after testing.   Note:  Her daughter states that she will likely bring her back with her to [place] for further management within the next month or so.  Thank you for allowing me to take part in the care of this patient.  Shon Millet, DO  CC:  Berniece Andreas, MD

## 2014-07-01 NOTE — Progress Notes (Signed)
Patient ID: Rachel Cantu, female   DOB: [birth year], 64 y.o.   MRN: [id number]  Rachel Cantu living [place]     Allergies  Allergen Reactions  . Sulfamethoxazole     REACTION: unspecified       Chief Complaint  Patient presents with  . Hospitalization Follow-up    HPI:  She has been hospitalized for acute systolic/diastolic heart failure; acute renal failure; and afib with rvr. She is status post mitral valve replacement. She has had a cva. She is unable to fully participate in the hpi or ros due to her difficulty in speech wandering. She is aware of surroundings and is aware of what people are saying to her. She has difficulty answering questions as she cannot maintain a line of speech.    Past Medical History  Diagnosis Date  . Hypertension   . History of chickenpox   . S/P MVR (mitral valve replacement) 1994  . Rheumatic fever     as a child  . Heart murmur   . Atrial fibrillation with RVR 05/29/2014    Rachel Cantu 05/29/2014    Past Surgical History  Procedure Laterality Date  . Mitral valve replacement  1994  . Tee without cardioversion  05/30/2014  . Tee without cardioversion N/A 05/30/2014    Procedure: TRANSESOPHAGEAL ECHOCARDIOGRAM (TEE);  Surgeon: Rachel Perla, MD;  Location: [facility] ENDOSCOPY;  Service: Cardiovascular;  Laterality: N/A;    VITAL SIGNS BP 125/67 mmHg  Pulse 65  Ht 5' 4" (1.626 m)  Wt 160 lb (72.576 kg)  BMI 27.45 kg/m2  SpO2 98%   Outpatient Encounter Prescriptions as of 06/14/2014  Medication Sig  . digoxin (LANOXIN) 0.125 MG tablet Take 1 tablet (0.125 mg total) by mouth daily.  . metoprolol (LOPRESSOR) 50 MG tablet Take 1 tablet (50 mg total) by mouth 2 (two) times daily.   protonix 40 mg  Take daily    k dur 10 meq  Take daily    Lasix 40 mg  Take daily   . warfarin (COUMADIN) 5 MG tablet Take 1 1/2 tablets (7.5 mg) MWF. Take 1 tablet (5 mg) Tue, Thurs, Sat, Sun (Patient taking differently: Take 1 & 1/2 Tablets daily)     SIGNIFICANT  DIAGNOSTIC EXAMS  05-28-14: chest x-ray: Marked cardiomegaly.  Probable mild congestive heart failure.  05-29-14: renal ultrasound: Normal appearance of the kidneys. No explanation for patient's renal failure.  05-29-14: 2-d echo: Left ventricle: The cavity size was normal. D-shaped septum consistent with right ventricular pressure/ volume overload. There was mild focal basal hypertrophy of the septum. Systolic function was severely reduced. The estimated ejection fraction was in the range of 20% to 25%. Diffuse hypokinesis. - Ventricular septum: Septal motion showed paradox. - Aortic valve: There was mild regurgitation. - Mitral valve: A mechanical prosthesis was present and functioning normally. The prosthesis had a normal range of motion. The sewing ring appeared normal, had no rocking motion, and showed no evidence of dehiscence. - Left atrium: The atrium was moderately dilated. - Right ventricle: The cavity size was moderately dilated. Wall thickness was normal. Systolic function was moderately reduced. - Right atrium: The atrium was moderately dilated. - Tricuspid valve: There was moderate regurgitation. - Pulmonary arteries: Systolic pressure was moderately increased.    06-02-14: chest x-ray: Congestive heart failure  06-03-14: ct of head: Atrophy, chronic small vessel ischemia, and remote prior infarct. No CT findings of acute intracranial abnormality.  06-06-14: ct of head: 1.  No acute intracranial abnormality identified. 2.  Stable and chronic appearing bilateral PCA and cerebellar ischemia. 3. Superficial/dermal scalp soft tissue lesion at the left vertex,  06-09-14: chest x-ray: Persistent vascular congestion and changes in the right base. No other focal abnormality is noted    LABS REVIEWED:   05-28-14: wbc 11.7; hct 15.2; hct 44.8; mcv 92.2; plt 133; glucose 154; bun 54; creat 2.52; k+4.2; na++135; ast 60; alt 48; t bili 2.2; albumin 3.0 05-29-14: tsh 1.494 06-02-14: wbc 7.8;  hgb 14.9; hct 43.5 ;mcv 92.6; plt 139; glucose 131; bun 16; creat 0.94 ;k+3.9; na++137 06-05-14: dig 1.0 06-06-14: glucose 1132; bun 21; creat 0.88; k+4.4; na++137; ast 21; alt 36; t bili 1.3; alk phos 134; albumin 3.1; sed rate 1 06-12-14: glucose 185; bun 24; creat 0.98; k+4.1; na++138  3-3-116; inr 2.292     Review of Systems  Constitutional: Negative for malaise/fatigue.  Respiratory: Negative for cough and shortness of breath.   Cardiovascular: Negative for chest pain and palpitations.  Gastrointestinal: Negative for heartburn, abdominal pain and constipation.  Musculoskeletal: Negative for myalgias and joint pain.  Skin: Negative.   Psychiatric/Behavioral: The patient is not nervous/anxious.      Physical Exam  Constitutional: She appears well-developed and well-nourished. No distress.  Neck: Neck supple. No JVD present. No thyromegaly present.  Cardiovascular: Normal rate, regular rhythm and intact distal pulses.   Respiratory: Effort normal and breath sounds normal. No respiratory distress.  GI: Soft. Bowel sounds are normal. She exhibits no distension. There is no tenderness.  Musculoskeletal: Normal range of motion. She exhibits edema.  2+ lower extremity edema   Neurological: She is alert.  Skin: Skin is warm and dry. She is not diaphoretic.       ASSESSMENT/ PLAN:  1. Afib; status post mvr: her inr is 2.92; with a goal 2.5-3.5. Will continue her coumadin 7.5 mg three days per week and 5 mg on other days; will continue lopressor 50 mg twice daily for rate control and digoxin 0.125 mg daily for rate control. Will monitor her status.   2. Acute diastolic and systolic heart failure: her EF is 20-25%; will continue lasix 40 mg daily with k+ 10 meq daily she has 2+ lower extremity edema; will continue her daily weights will monitor  3. Cognitive impairment: her ct scan does show atrophy present; will continue st as directed; and will monitor her status. She will follow up  with neurology   4. Gerd: will continue protonix 40 mg daily    Time spent with patient 50 minutes.    Ok Edwards NP [facility]  Contact [phone] Monday through Friday 8am- 5pm  After hours call [phone]

## 2014-07-01 NOTE — Patient Instructions (Signed)
I think the primary cause of the cognitive and behavioral changes are likely due to a stroke. 1.  In case there might be an underlying neurodegenerative dementia, we will start aricept.  We will start donepezil (Aricept) 5mg  daily for four weeks.  If you are tolerating the medication, then after four weeks, we will increase the dose to 10mg  daily.  Side effects include nausea, vomiting, diarrhea, vivid dreams, and muscle cramps.  Please call the clinic if you experience any of these symptoms. 2.  We will refer you for neuropsychological testing 3.  We will try and set up a home health assessment for safety 4.  No driving 5.  Will need somebody to make sure she takes her medications correctly. 6.  Follow up after testing

## 2014-07-02 ENCOUNTER — Non-Acute Institutional Stay (SKILLED_NURSING_FACILITY): Payer: Medicare Other | Admitting: Adult Health

## 2014-07-02 ENCOUNTER — Telehealth: Payer: Self-pay | Admitting: Internal Medicine

## 2014-07-02 DIAGNOSIS — E785 Hyperlipidemia, unspecified: Secondary | ICD-10-CM

## 2014-07-02 DIAGNOSIS — R4189 Other symptoms and signs involving cognitive functions and awareness: Secondary | ICD-10-CM

## 2014-07-02 DIAGNOSIS — R2990 Unspecified symptoms and signs involving the nervous system: Secondary | ICD-10-CM

## 2014-07-02 DIAGNOSIS — I4891 Unspecified atrial fibrillation: Secondary | ICD-10-CM

## 2014-07-02 DIAGNOSIS — I5043 Acute on chronic combined systolic (congestive) and diastolic (congestive) heart failure: Secondary | ICD-10-CM

## 2014-07-02 NOTE — Telephone Encounter (Signed)
Advise she get her coumadin now monitored at cardiology   [facility]. We can do the general home health orders ( Cardiac rehab and med managment  should go to cards)

## 2014-07-02 NOTE — Telephone Encounter (Signed)
Verbal order given to check patient's INR.

## 2014-07-02 NOTE — Telephone Encounter (Signed)
[facility] would like to know if you want them to check pt's coum? She is not on any body's schedule and has not been done lately.

## 2014-07-02 NOTE — Telephone Encounter (Signed)
All faxes were sent

## 2014-07-02 NOTE — Telephone Encounter (Signed)
Spoke to TecumsehAnnette and informed her that coumadin can be monitored at cardiology.  We will be able to do the general home health orders but will not be able to do the cardiac rehab or med management.  Those should go to cardiology.  Drinda Buttsnnette has the name and number of cardiology physician.  She will be in contact with their office.

## 2014-07-02 NOTE — Telephone Encounter (Signed)
[facility]. Rachel Cantu would like to know if md will be signing home health orders and also who will be managing her coumadin

## 2014-07-08 ENCOUNTER — Telehealth: Payer: Self-pay | Admitting: Internal Medicine

## 2014-07-08 LAB — POCT INR: INR: 3.4

## 2014-07-08 NOTE — Telephone Encounter (Signed)
Discussed with Theodosia PalingJosh Hawkins, RN and he will address.

## 2014-07-08 NOTE — Telephone Encounter (Signed)
Arline AspCindy is out today so Dr Fabian SharpPanosh will need to address.

## 2014-07-08 NOTE — Telephone Encounter (Signed)
Spoke with patient.  Per Lilian ComaScott Weaver-PA at Cardiology, the INR goal is 2.5-3.5.  INR of 3.4 is within range.  Dr. Fabian SharpPanosh has previously advised patient to have INR/Coumadin managed by her Cardiologist.  Discussed this with patient.  She verbalized and understanding and stated she would prefer to have her Coumadin managed by the individual prescribing her therapy.  She will have her home health nurse Alvino Chapel(Ellen) call the Cardiology office in the morning.  Encouraged to call with questions.

## 2014-07-08 NOTE — Telephone Encounter (Signed)
Calling to alert you to an elevated PT/INR of 40.5/3.4. Is currently taking 7.5 mg QD. Please advise. OK to LM if needed.

## 2014-07-09 ENCOUNTER — Telehealth: Payer: Self-pay | Admitting: Cardiology

## 2014-07-09 ENCOUNTER — Ambulatory Visit (INDEPENDENT_AMBULATORY_CARE_PROVIDER_SITE_OTHER): Payer: Self-pay | Admitting: Interventional Cardiology

## 2014-07-09 DIAGNOSIS — Z954 Presence of other heart-valve replacement: Secondary | ICD-10-CM

## 2014-07-09 DIAGNOSIS — Z952 Presence of prosthetic heart valve: Secondary | ICD-10-CM

## 2014-07-09 NOTE — Telephone Encounter (Signed)
error 

## 2014-07-11 DIAGNOSIS — I6931 Cognitive deficits following cerebral infarction: Secondary | ICD-10-CM

## 2014-07-17 DIAGNOSIS — R413 Other amnesia: Secondary | ICD-10-CM | POA: Diagnosis not present

## 2014-07-19 ENCOUNTER — Ambulatory Visit (INDEPENDENT_AMBULATORY_CARE_PROVIDER_SITE_OTHER): Payer: Self-pay | Admitting: Pharmacist

## 2014-07-19 DIAGNOSIS — Z952 Presence of prosthetic heart valve: Secondary | ICD-10-CM

## 2014-07-19 DIAGNOSIS — I480 Paroxysmal atrial fibrillation: Secondary | ICD-10-CM | POA: Diagnosis not present

## 2014-07-19 DIAGNOSIS — N189 Chronic kidney disease, unspecified: Secondary | ICD-10-CM | POA: Diagnosis not present

## 2014-07-19 DIAGNOSIS — I5021 Acute systolic (congestive) heart failure: Secondary | ICD-10-CM | POA: Diagnosis not present

## 2014-07-19 DIAGNOSIS — I129 Hypertensive chronic kidney disease with stage 1 through stage 4 chronic kidney disease, or unspecified chronic kidney disease: Secondary | ICD-10-CM | POA: Diagnosis not present

## 2014-07-19 DIAGNOSIS — Z954 Presence of other heart-valve replacement: Secondary | ICD-10-CM

## 2014-07-19 DIAGNOSIS — I6931 Cognitive deficits following cerebral infarction: Secondary | ICD-10-CM | POA: Diagnosis not present

## 2014-07-19 LAB — POCT INR: INR: 5.2

## 2014-07-21 NOTE — Progress Notes (Signed)
Patient ID: Rachel Cantu, female   DOB: 09-Feb-1951, 64 y.o.   MRN: [id number]  Rachel Cantu living      Allergies  Allergen Reactions  . Sulfamethoxazole     REACTION: unspecified       Chief Complaint  Patient presents with  . Acute Visit    family concerns     HPI:  Her family has many questions regarding her overall status. Everyone keeps telling the daughter that she is doing well/ her daughter states her mother is sick and cannot figure why everyone denies that her mother is sick. I spent an extensive period of time discussing that doing well is all relative to whom we are speaking of. We discussed that for her she is doing well. We do recognize that her overall health is poor. We discussed her treatment options; her further follow up care. Her daughter is going to move her parents to [place] with her in the near future.    Past Medical History  Diagnosis Date  . Hypertension   . History of chickenpox   . S/P MVR (mitral valve replacement) 1994  . Rheumatic fever     as a child  . Heart murmur   . Atrial fibrillation with RVR 05/29/2014    Archie Endo 05/29/2014    Past Surgical History  Procedure Laterality Date  . Mitral valve replacement  1994  . Tee without cardioversion  05/30/2014  . Tee without cardioversion N/A 05/30/2014    Procedure: TRANSESOPHAGEAL ECHOCARDIOGRAM (TEE);  Surgeon: Lelon Perla, MD;  Location: [facility] ENDOSCOPY;  Service: Cardiovascular;  Laterality: N/A;    VITAL SIGNS BP 119/70 mmHg  Pulse 76  Ht '5\' 4"'  (1.626 m)  Wt 161 lb (73.029 kg)  BMI 27.62 kg/m2   Outpatient Encounter Prescriptions as of 06/17/2014  Medication Sig   . digoxin (LANOXIN) 0.125 MG tablet Take 1 tablet (0.125 mg total) by mouth daily.  . metoprolol (LOPRESSOR) 50 MG tablet Take 1 tablet (50 mg total) by mouth 2 (two) times daily.   protonix 40 mg  Take daily    k dur 10 meq  Take daily    Lasix 40 mg  Take daily   . warfarin (COUMADIN) 5 MG tablet  take 7.5 mg  daily       SIGNIFICANT DIAGNOSTIC EXAMS   05-28-14: chest x-ray: Marked cardiomegaly.  Probable mild congestive heart failure.  05-29-14: renal ultrasound: Normal appearance of the kidneys. No explanation for patient's renal failure.  05-29-14: 2-d echo: Left ventricle: The cavity size was normal. D-shaped septum consistent with right ventricular pressure/ volume overload. There was mild focal basal hypertrophy of the septum. Systolic function was severely reduced. The estimated ejection fraction was in the range of 20% to 25%. Diffuse hypokinesis. - Ventricular septum: Septal motion showed paradox. - Aortic valve: There was mild regurgitation. - Mitral valve: A mechanical prosthesis was present and functioning normally. The prosthesis had a normal range of motion. The sewing ring appeared normal, had no rocking motion, and showed no evidence of dehiscence. - Left atrium: The atrium was moderately dilated. - Right ventricle: The cavity size was moderately dilated. Wall thickness was normal. Systolic function was moderately reduced. - Right atrium: The atrium was moderately dilated. - Tricuspid valve: There was moderate regurgitation. - Pulmonary arteries: Systolic pressure was moderately increased.    06-02-14: chest x-ray: Congestive heart failure  06-03-14: ct of head: Atrophy, chronic small vessel ischemia, and remote prior infarct. No CT findings of acute intracranial  abnormality.  06-06-14: ct of head: 1.  No acute intracranial abnormality identified. 2. Stable and chronic appearing bilateral PCA and cerebellar ischemia. 3. Superficial/dermal scalp soft tissue lesion at the left vertex,  06-09-14: chest x-ray: Persistent vascular congestion and changes in the right base. No other focal abnormality is noted    LABS REVIEWED:   05-28-14: wbc 11.7; hct 15.2; hct 44.8; mcv 92.2; plt 133; glucose 154; bun 54; creat 2.52; k+4.2; na++135; ast 60; alt 48; t bili 2.2; albumin 3.0 05-29-14:  tsh 1.494 06-02-14: wbc 7.8; hgb 14.9; hct 43.5 ;mcv 92.6; plt 139; glucose 131; bun 16; creat 0.94 ;k+3.9; na++137 06-05-14: dig 1.0 06-06-14: glucose 1132; bun 21; creat 0.88; k+4.4; na++137; ast 21; alt 36; t bili 1.3; alk phos 134; albumin 3.1; sed rate 1 06-12-14: glucose 185; bun 24; creat 0.98; k+4.1; na++138  06-13-14; inr 2.292  06-17-14: inr: 2.07: coumadin 7.5 mg daily     ROS Constitutional: Negative for malaise/fatigue.  Respiratory: Negative for cough and shortness of breath.   Cardiovascular: Negative for chest pain and palpitations.  Gastrointestinal: Negative for heartburn, abdominal pain and constipation.  Musculoskeletal: Negative for myalgias and joint pain.  Skin: Negative.   Psychiatric/Behavioral: The patient is not nervous/anxious.      Physical Exam Constitutional: She appears well-developed and well-nourished. No distress.  Neck: Neck supple. No JVD present. No thyromegaly present.  Cardiovascular: Normal rate, regular rhythm and intact distal pulses.   Respiratory: Effort normal and breath sounds normal. No respiratory distress.  GI: Soft. Bowel sounds are normal. She exhibits no distension. There is no tenderness.  Musculoskeletal: Normal range of motion. She exhibits edema.  2+ lower extremity edema   Neurological: She is alert.  Skin: Skin is warm and dry. She is not diaphoretic.     ASSESSMENT/ PLAN:   1. afib 2. Mitral valve replacement 3. Anticoagulation management 4. Systolic and diastolic heart failure  For her inr of 2.07 will continue coumadin 7.5 mg daily and will check inr on 06-21-14 Will increase lasix to 40 mg twice daily with k+ 10 meq twice daily  Will wear ted hose Will check bmp in one week   Time spent with patient 60 minutes.     Ok Edwards NP [facility]  Contact [phone] Monday through Friday 8am- 5pm  After hours call [phone]

## 2014-07-22 ENCOUNTER — Encounter: Payer: Self-pay | Admitting: Internal Medicine

## 2014-07-22 ENCOUNTER — Ambulatory Visit (INDEPENDENT_AMBULATORY_CARE_PROVIDER_SITE_OTHER): Payer: Self-pay | Admitting: Internal Medicine

## 2014-07-22 VITALS — BP 114/70 | Temp 97.7°F | Wt 146.8 lb

## 2014-07-22 DIAGNOSIS — R634 Abnormal weight loss: Secondary | ICD-10-CM

## 2014-07-22 DIAGNOSIS — R739 Hyperglycemia, unspecified: Secondary | ICD-10-CM

## 2014-07-22 DIAGNOSIS — I4891 Unspecified atrial fibrillation: Secondary | ICD-10-CM

## 2014-07-22 DIAGNOSIS — R4189 Other symptoms and signs involving cognitive functions and awareness: Secondary | ICD-10-CM

## 2014-07-22 LAB — GLUCOSE, POCT (MANUAL RESULT ENTRY): POC Glucose: 87 mg/dl (ref 70–99)

## 2014-07-22 NOTE — Patient Instructions (Addendum)
Healthy eating ,need to follow weight loss  Nutrition evaluatoin if continuing.  Blood sugar is excellent today .   Wt Readings from Last 3 Encounters:  07/22/14 146 lb 12.8 oz (66.588 kg)  07/01/14 157 lb (71.215 kg)  06/27/14 156 lb (70.761 kg)   ROV in 2 months if still  Living in [place]

## 2014-07-22 NOTE — Progress Notes (Signed)
Pre visit review using our clinic review tool, if applicable. No additional management support is needed unless otherwise documented below in the visit note.  Chief Complaint  Patient presents with  . Follow-up    hosp   after one month prob cva cog changes    HPI: Rachel Cantu 64 y.o. comesin for fu  From last month after  hopsitalizatino for ahf and had acute cns deterioaration  .  Since  Last visit has seen neurology  Dr Ernie Hew  And felt to have had a CE event but scan not confirming  . Had neuro psych eval and dont have the current report but has fu dr Shela Commons next week.   Begun on  aricept  Husband brought her today  First cousin to pick her up.  But no  Other for the visit  To restart different  Coumadin  again  To be sure ok.  ROS: See pertinent positives and negatives per HPI. Says eating healthier . Husband preparing food    Past Medical History  Diagnosis Date  . Hypertension   . History of chickenpox   . S/P MVR (mitral valve replacement) 1994  . Rheumatic fever     as a child  . Heart murmur   . Atrial fibrillation with RVR 05/29/2014    Hattie Perch 05/29/2014    Family History  Problem Relation Age of Onset  . Heart disease Father     premature age 42  . Alzheimer's disease Mother   . Heart attack Neg Hx   . Stroke Neg Hx     History   Social History  . Marital Status: Married    Spouse Name: N/A  . Number of Children: N/A  . Years of Education: N/A   Social History Main Topics  . Smoking status: Never Smoker   . Smokeless tobacco: Never Used  . Alcohol Use: No  . Drug Use: No  . Sexual Activity: Yes   Other Topics Concern  . None   Social History Narrative   hh of 2    G1P1   Divorced    Outpatient Encounter Prescriptions as of 07/22/2014  Medication Sig  . digoxin (LANOXIN) 0.125 MG tablet Take 1 tablet (0.125 mg total) by mouth daily.  Marland Kitchen donepezil (ARICEPT) 5 MG tablet Take 1 tablet (5 mg total) by mouth at bedtime.  . furosemide (LASIX) 40 MG  tablet Take 1.5 tablets (60 mg total) by mouth daily.  . metoprolol (LOPRESSOR) 50 MG tablet Take 1 tablet (50 mg total) by mouth 2 (two) times daily.  . Potassium Chloride ER 20 MEQ TBCR Take 20 mEq by mouth daily.  Marland Kitchen warfarin (COUMADIN) 5 MG tablet Take 1 1/2 tablets (7.5 mg) MWF. Take 1 tablet (5 mg) Tue, Thurs, Sat, Sun (Patient taking differently: Take 1 & 1/2 Tablets daily)    EXAM:  BP 114/70 mmHg  Temp(Src) 97.7 F (36.5 C) (Oral)  Wt 146 lb 12.8 oz (66.588 kg)  Body mass index is 24.43 kg/(m^2).  GENERAL: vitals reviewed and listed above, alert, oriented, appears well hydrated and in no acute distress clothes are loose on her  Less diffuse than last visit   Grooming not up to her usual standard but adequte HEENT: atraumatic, conjunctiva  clear, no obvious abnormalities on inspection of external nose and ears NECK: no obvious masses on inspection palpation  LUNGS: clear to auscultation bilaterally, no wheezes, rales or rhonchi, good air movement CV: HI RRR, no clubbing cyanosis or  peripheral edema nl cap refill rate about 68 MS: moves all extremities without noticeable focal  abnormality PSYCH: complete sentences but distraction of thought   Closer to baseline Lab Results  Component Value Date   WBC 7.2 06/08/2014   HGB 15.0 06/08/2014   HCT 43.9 06/08/2014   PLT 173 06/08/2014   GLUCOSE 185* 06/12/2014   CHOL 244* 11/23/2011   TRIG 167.0* 11/23/2011   HDL 54.40 11/23/2011   LDLDIRECT 167.7 11/23/2011   LDLCALC 115* 06/13/2007   ALT 36* 06/06/2014   AST 31 06/06/2014   NA 138 06/12/2014   K 4.1 06/12/2014   CL 105 06/12/2014   CREATININE 0.98 06/12/2014   BUN 24* 06/12/2014   CO2 28 06/12/2014   TSH 1.494 05/29/2014   INR 5.2 07/19/2014    ASSESSMENT AND PLAN:  Discussed the following assessment and plan:  Cognitive change - in hosp poss from event cva neuro psych eval pending  Loss of weight - uncertain cause except illness and other in control of food  prep etc now  Hyperglycemia - Plan: POC Glucose (CBG)  Atrial fibrillation, unspecified Has fu with cardiology  Neuro evaluating   ? If going to move to [place].    No one  To confirm plans at the visit today  Back on coumadin.  Training to drive again with PT/OT? -Patient advised to return or notify health care team  if symptoms worsen ,persist or new concerns arise. Or 2 months   Patient Instructions   Healthy eating ,need to follow weight loss  Nutrition evaluatoin if continuing.  Blood sugar is excellent today .   Wt Readings from Last 3 Encounters:  07/22/14 146 lb 12.8 oz (66.588 kg)  07/01/14 157 lb (71.215 kg)  06/27/14 156 lb (70.761 kg)   ROV in 2 months if still  [place] [facility]       [place] Y[place]ir [place] M.D. 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 Given the atrophy out of proportion to age on CT, possible underlying neurodegenerative dementia is possible.  PLAN: 1. Given the possibility of underlying neurodegenerative process, will start Aricept 5mg . They should contact us in 4 weeks with update and we can then increase to 10mg  daily. 2. Refer for neuropsychological testing 3. Home Health safety assessment. 4. Should not drive. 5. Needs somebody to make sure she takes her medications correctly. 6. On Coumadin 7. Follow up after testing.  Note: Her daughter states that she will likely bring her back with her to [place] for further management within the next month or so.  Thank you for allowing me to take part in the care of this patient.  Shon MilletAdam Jaffe, DO I think the primary cause of the cognitive and behavioral changes are likely due to a stroke. 1. In case there might be an underlying neurodegenerative dementia, we will start aricept. We will start donepezil (Aricept) 5mg  daily for four weeks. If you are tolerating the medication, then after four weeks, we will increase the dose to 10mg   daily. Side effects include nausea, vomiting, diarrhea, vivid dreams, and muscle cramps. Please call the clinic if you experience any of these symptoms. 2. We will refer you for neuropsychological testing 3. We will try and set up a home health assessment for safety 4. No driving 5. Will need somebody to make sure she takes her medications correctly. 6. Follow up after testing

## 2014-07-23 DIAGNOSIS — N189 Chronic kidney disease, unspecified: Secondary | ICD-10-CM | POA: Diagnosis not present

## 2014-07-23 DIAGNOSIS — I6931 Cognitive deficits following cerebral infarction: Secondary | ICD-10-CM | POA: Diagnosis not present

## 2014-07-23 DIAGNOSIS — I5021 Acute systolic (congestive) heart failure: Secondary | ICD-10-CM | POA: Diagnosis not present

## 2014-07-23 DIAGNOSIS — I129 Hypertensive chronic kidney disease with stage 1 through stage 4 chronic kidney disease, or unspecified chronic kidney disease: Secondary | ICD-10-CM | POA: Diagnosis not present

## 2014-07-23 DIAGNOSIS — I480 Paroxysmal atrial fibrillation: Secondary | ICD-10-CM | POA: Diagnosis not present

## 2014-07-24 ENCOUNTER — Ambulatory Visit: Payer: Medicare Other | Admitting: Cardiology

## 2014-07-25 ENCOUNTER — Ambulatory Visit (INDEPENDENT_AMBULATORY_CARE_PROVIDER_SITE_OTHER): Payer: Self-pay | Admitting: Cardiovascular Disease

## 2014-07-25 DIAGNOSIS — I129 Hypertensive chronic kidney disease with stage 1 through stage 4 chronic kidney disease, or unspecified chronic kidney disease: Secondary | ICD-10-CM | POA: Diagnosis not present

## 2014-07-25 DIAGNOSIS — I5021 Acute systolic (congestive) heart failure: Secondary | ICD-10-CM | POA: Diagnosis not present

## 2014-07-25 DIAGNOSIS — Z952 Presence of prosthetic heart valve: Secondary | ICD-10-CM

## 2014-07-25 DIAGNOSIS — N189 Chronic kidney disease, unspecified: Secondary | ICD-10-CM | POA: Diagnosis not present

## 2014-07-25 DIAGNOSIS — I6931 Cognitive deficits following cerebral infarction: Secondary | ICD-10-CM | POA: Diagnosis not present

## 2014-07-25 DIAGNOSIS — I480 Paroxysmal atrial fibrillation: Secondary | ICD-10-CM | POA: Diagnosis not present

## 2014-07-25 DIAGNOSIS — Z954 Presence of other heart-valve replacement: Secondary | ICD-10-CM

## 2014-07-25 LAB — POCT INR: INR: 3.7

## 2014-07-26 DIAGNOSIS — I480 Paroxysmal atrial fibrillation: Secondary | ICD-10-CM | POA: Diagnosis not present

## 2014-07-26 DIAGNOSIS — I129 Hypertensive chronic kidney disease with stage 1 through stage 4 chronic kidney disease, or unspecified chronic kidney disease: Secondary | ICD-10-CM | POA: Diagnosis not present

## 2014-07-26 DIAGNOSIS — I5021 Acute systolic (congestive) heart failure: Secondary | ICD-10-CM | POA: Diagnosis not present

## 2014-07-26 DIAGNOSIS — I6931 Cognitive deficits following cerebral infarction: Secondary | ICD-10-CM | POA: Diagnosis not present

## 2014-07-26 DIAGNOSIS — N189 Chronic kidney disease, unspecified: Secondary | ICD-10-CM | POA: Diagnosis not present

## 2014-07-29 ENCOUNTER — Ambulatory Visit (INDEPENDENT_AMBULATORY_CARE_PROVIDER_SITE_OTHER): Payer: Medicare Other | Admitting: Neurology

## 2014-07-29 ENCOUNTER — Encounter: Payer: Self-pay | Admitting: Neurology

## 2014-07-29 VITALS — BP 108/70 | HR 68 | Resp 18 | Ht 65.0 in | Wt 145.3 lb

## 2014-07-29 DIAGNOSIS — I679 Cerebrovascular disease, unspecified: Secondary | ICD-10-CM | POA: Diagnosis not present

## 2014-07-29 DIAGNOSIS — R4189 Other symptoms and signs involving cognitive functions and awareness: Secondary | ICD-10-CM

## 2014-07-29 DIAGNOSIS — I639 Cerebral infarction, unspecified: Secondary | ICD-10-CM

## 2014-07-29 DIAGNOSIS — I6931 Cognitive deficits following cerebral infarction: Secondary | ICD-10-CM | POA: Diagnosis not present

## 2014-07-29 DIAGNOSIS — I480 Paroxysmal atrial fibrillation: Secondary | ICD-10-CM

## 2014-07-29 DIAGNOSIS — I4891 Unspecified atrial fibrillation: Secondary | ICD-10-CM | POA: Diagnosis not present

## 2014-07-29 DIAGNOSIS — I059 Rheumatic mitral valve disease, unspecified: Secondary | ICD-10-CM | POA: Diagnosis not present

## 2014-07-29 DIAGNOSIS — I10 Essential (primary) hypertension: Secondary | ICD-10-CM | POA: Diagnosis not present

## 2014-07-29 MED ORDER — DONEPEZIL HCL 10 MG PO TABS: 10.0000 mg | ORAL_TABLET | Freq: Every day | ORAL | Status: DC

## 2014-07-29 NOTE — Patient Instructions (Signed)
At this point, we will see what Dr. Pincus SanesSullivan's assessment is. In the meantime, we will increase Aricept to 10mg  daily.  I put in a new prescription for this. Follow up in 6 months but call sooner if you will be moving to [place] before then.

## 2014-07-29 NOTE — Progress Notes (Addendum)
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  She is accompanied by her husband who provides some history.  UPDATE: She underwent the first part of neuropsychological testing.  However, her second part is later today, so I don't have the results yet.  She was started on Aricept 5mg  daily and seems to be tolerating it.  There is no set date for when she will move to [place] at this time.  I asked her husband if he has noticed any significant change after the stroke.  He notes mild changes such as short-term memory and sometimes going off on a tangent when she speaks, however she seems overall at baseline.  HISTORY: On 05/28/14, [facility] with respiratory distress and palpitations.  She was found to be in atrial fibrillation with RVR in 140s, as well as acute decompensated heart failure.  2D echo showed EF 20-25%.  Troponins were mildly elevated and though to be secondary to demand ischemia in setting of atrial fibrillation with RVR.  TEE showed large LA thrombus.  Her rate was controlled with metoprolol and Digoxin.  She was continued on Coumadin.  She was discharged to SNF, as she required 24 hour assistance.  She had an episode of dyspnea that required sedation and monitoring on the ICU.  After she woke up, she acted strange.  Her daughter flew in from [place] to see her.  The patient appeared not to recognize her.  She also seemed unconcerned and was tangential in speech.  She was also asking about her mother who is deceased.  At one point, she referred to her daughter by a different name.  At first, it was thought to be secondary to coming out of sedation.  Although she had improved, she was far from baseline.  To  assess altered mental status, CT of head was performed on 06/03/14, which showed atrophy and chronic small vessel disease with remote right greater than left PCA territory ischemia involving parietal-occipital lobes and cerebellum.  Repeat imaging on 06/06/14 was unchanged.  She is unable to have an MRI due to mechanical valve.    She was discharged to a SNF.  She has a tendency to wander.  Currently, she does not remember her passwords to her various financial files on the computer.  She is able to dress and bathe herself but has required some assistance in the SNF due to physical limitations.  She has a poor appetite.  She is not combative.  She is scheduled to be discharged home tomorrow.    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  She has a master's degree.  She lives with her husband.  Prior to her hospitalization, she managed her own finances.   Her mother had Alzheimer's.  PAST MEDICAL HISTORY: Past Medical History  Diagnosis Date  . Hypertension   . History of chickenpox   . S/P MVR (mitral valve replacement) 1994  . Rheumatic fever     as a child  . Heart murmur   . Atrial fibrillation with RVR 05/29/2014    Hattie Perch/notes 05/29/2014    MEDICATIONS: Current Outpatient Prescriptions on File Prior to Visit  Medication Sig Dispense Refill  . digoxin (LANOXIN) 0.125 MG tablet Take 1  tablet (0.125 mg total) by mouth daily. 30 tablet 5  . furosemide (LASIX) 40 MG tablet Take 1.5 tablets (60 mg total) by mouth daily.    . metoprolol (LOPRESSOR) 50 MG tablet Take 1 tablet (50 mg total) by mouth 2 (two) times daily. 60 tablet 5  . Potassium Chloride ER 20 MEQ TBCR Take 20 mEq by mouth daily.    Marland Kitchen warfarin (COUMADIN) 5 MG tablet Take 1 1/2 tablets (7.5 mg) MWF. Take 1 tablet (5 mg) Tue, Thurs, Sat, Sun (Patient taking differently: Take 1 & 1/2 Tablets daily) 60 tablet 5   No current facility-administered medications on file prior to visit.    ALLERGIES: Allergies  Allergen  Reactions  . Sulfamethoxazole     REACTION: unspecified    FAMILY HISTORY: Family History  Problem Relation Age of Onset  . Heart disease Father     premature age 39  . Alzheimer's disease Mother   . Heart attack Neg Hx   . Stroke Neg Hx     SOCIAL HISTORY: History   Social History  . Marital Status: Married    Spouse Name: N/A  . Number of Children: N/A  . Years of Education: N/A   Occupational History  . Not on file.   Social History Main Topics  . Smoking status: Never Smoker   . Smokeless tobacco: Never Used  . Alcohol Use: No  . Drug Use: No  . Sexual Activity: Yes   Other Topics Concern  . Not on file   Social History Narrative   hh of 2    G1P1   Divorced    REVIEW OF SYSTEMS: Constitutional: No fevers, chills, or sweats, no generalized fatigue, change in appetite Eyes: No visual changes, double vision, eye pain Ear, nose and throat: No hearing loss, ear pain, nasal congestion, sore throat Cardiovascular: No chest pain, palpitations Respiratory:  No shortness of breath at rest or with exertion, wheezes GastrointestinaI: No nausea, vomiting, diarrhea, abdominal pain, fecal incontinence Genitourinary:  No dysuria, urinary retention or frequency Musculoskeletal:  No neck pain, back pain Integumentary: No rash, pruritus, skin lesions Neurological: as above Psychiatric: No depression, insomnia, anxiety Endocrine: No palpitations, fatigue, diaphoresis, mood swings, change in appetite, change in weight, increased thirst Hematologic/Lymphatic:  No anemia, purpura, petechiae. 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 Given the atrophy out of  proportion to age on CT, possible underlying neurodegenerative dementia is possible.  She does exhibit unusual behavior and signs of cognitive impairment.  Her husband says she is almost at baseline, although she still exhibits behavioral changes.  PLAN: Increase Aricept to  daily.  Primary therapy should focus on management of secondary stroke risk factors.  Use of memory-enhancing medications such as Aricept is of uncertain usefulness, but I would continue it if she is tolerating it. Should not drive Continued supervision On Coumadin for secondary stroke prevention. Follow up in 6 months but should contact us if will be moving to [place] before then.  15 minutes spent with patient, 100% spent discussing status and management.  Shon Millet, DO  CC:  Berniece Andreas, MD

## 2014-07-30 ENCOUNTER — Telehealth: Payer: Self-pay | Admitting: *Deleted

## 2014-07-30 DIAGNOSIS — I5021 Acute systolic (congestive) heart failure: Secondary | ICD-10-CM | POA: Diagnosis not present

## 2014-07-30 DIAGNOSIS — I6931 Cognitive deficits following cerebral infarction: Secondary | ICD-10-CM | POA: Diagnosis not present

## 2014-07-30 DIAGNOSIS — I480 Paroxysmal atrial fibrillation: Secondary | ICD-10-CM | POA: Diagnosis not present

## 2014-07-30 DIAGNOSIS — I129 Hypertensive chronic kidney disease with stage 1 through stage 4 chronic kidney disease, or unspecified chronic kidney disease: Secondary | ICD-10-CM | POA: Diagnosis not present

## 2014-07-30 DIAGNOSIS — N189 Chronic kidney disease, unspecified: Secondary | ICD-10-CM | POA: Diagnosis not present

## 2014-07-30 NOTE — Telephone Encounter (Signed)
Patient daughter following up on her neuropsy eval. She also wants to know if he has any other medication recommendations    Call back number [phone] Baldemar Lenis(Tara Rodgers)

## 2014-07-30 NOTE — Telephone Encounter (Signed)
Please advise on below message.

## 2014-07-30 NOTE — Telephone Encounter (Signed)
If the patient continues to be irritable, the only thing I would add would be Lexapro 10mg  daily.

## 2014-07-30 NOTE — Telephone Encounter (Signed)
Left message for daughter to call office . I am returning her call

## 2014-08-01 ENCOUNTER — Telehealth: Payer: Self-pay | Admitting: *Deleted

## 2014-08-01 ENCOUNTER — Telehealth: Payer: Self-pay | Admitting: Internal Medicine

## 2014-08-01 ENCOUNTER — Other Ambulatory Visit: Payer: Self-pay | Admitting: *Deleted

## 2014-08-01 ENCOUNTER — Telehealth: Payer: Self-pay | Admitting: Cardiology

## 2014-08-01 ENCOUNTER — Ambulatory Visit (INDEPENDENT_AMBULATORY_CARE_PROVIDER_SITE_OTHER): Payer: Self-pay | Admitting: Internal Medicine

## 2014-08-01 ENCOUNTER — Other Ambulatory Visit: Payer: Self-pay | Admitting: Adult Health

## 2014-08-01 DIAGNOSIS — I129 Hypertensive chronic kidney disease with stage 1 through stage 4 chronic kidney disease, or unspecified chronic kidney disease: Secondary | ICD-10-CM | POA: Diagnosis not present

## 2014-08-01 DIAGNOSIS — Z5181 Encounter for therapeutic drug level monitoring: Secondary | ICD-10-CM

## 2014-08-01 DIAGNOSIS — I6931 Cognitive deficits following cerebral infarction: Secondary | ICD-10-CM | POA: Diagnosis not present

## 2014-08-01 DIAGNOSIS — I5022 Chronic systolic (congestive) heart failure: Secondary | ICD-10-CM

## 2014-08-01 DIAGNOSIS — Z954 Presence of other heart-valve replacement: Secondary | ICD-10-CM

## 2014-08-01 DIAGNOSIS — I480 Paroxysmal atrial fibrillation: Secondary | ICD-10-CM | POA: Diagnosis not present

## 2014-08-01 DIAGNOSIS — I5021 Acute systolic (congestive) heart failure: Secondary | ICD-10-CM | POA: Diagnosis not present

## 2014-08-01 DIAGNOSIS — N189 Chronic kidney disease, unspecified: Secondary | ICD-10-CM | POA: Diagnosis not present

## 2014-08-01 DIAGNOSIS — Z952 Presence of prosthetic heart valve: Secondary | ICD-10-CM

## 2014-08-01 LAB — POCT INR: INR: 3.6

## 2014-08-01 MED ORDER — FUROSEMIDE 40 MG PO TABS: 60.0000 mg | ORAL_TABLET | Freq: Every day | ORAL | Status: DC

## 2014-08-01 MED ORDER — POTASSIUM CHLORIDE ER 20 MEQ PO TBCR: 20.0000 meq | EXTENDED_RELEASE_TABLET | Freq: Every day | ORAL | Status: DC

## 2014-08-01 MED ORDER — WARFARIN SODIUM 7.5 MG PO TABS: 7.5000 mg | ORAL_TABLET | ORAL | Status: AC

## 2014-08-01 NOTE — Telephone Encounter (Signed)
Patients husband called to update her medication list and questions with refills  Call back number [phone] [facility]

## 2014-08-01 NOTE — Telephone Encounter (Signed)
Spoke with daughter, her mother has been using a 7.5mg s tablet but a prescription was sent in March for 5mg s tablet. Thus, I informed her that I would delete 5mg s off of profile and order a 7.5mg s tablet. Dose instructions reviewed with daughter from today's INR.

## 2014-08-01 NOTE — Telephone Encounter (Signed)
Pt needs refills on metoprolol 50 mg #90. Digoxin 0.125 mg #90, potassium 10 meq twice a day #180 and coumadin 7.5 mg #90 call into [facility] in [place]. Pt daughter states these are the correct dosage her mom needs. Pt daughter is aware md out of office this afternoon,

## 2014-08-01 NOTE — Telephone Encounter (Signed)
I spoke with patient spouse regarding medication Aricept  it was refilled by Dr Everlena CooperJaffe on 07/29/14 her other meds will need to be filled by PCP

## 2014-08-01 NOTE — Telephone Encounter (Signed)
Rachel Cantu

## 2014-08-01 NOTE — Telephone Encounter (Signed)
New Message  Pt  Daughter wanted to clarify new Rx for Warfarin. Please call back and discuss.

## 2014-08-01 NOTE — Telephone Encounter (Signed)
Daughter Napoleon Formara  Called asking if you were going to call in any other medications for her mother after [facility] ? Please advise

## 2014-08-01 NOTE — Telephone Encounter (Signed)
Spoke to [place].  I informed [facility] and she needs to call cardiology.  She then told me that cardiology did send in the prescriptions but they sent in the wrong amount and directions.  Advised that she call cardiology back to find out where the mistake/misunderstanding happened and have them re send medications if needed.

## 2014-08-01 NOTE — Telephone Encounter (Signed)
Baldemar Lenisara Rodgers calling in reference to her mother  Call back number [phone]

## 2014-08-05 ENCOUNTER — Encounter: Payer: Self-pay | Admitting: *Deleted

## 2014-08-05 ENCOUNTER — Other Ambulatory Visit: Payer: Self-pay | Admitting: *Deleted

## 2014-08-06 DIAGNOSIS — I129 Hypertensive chronic kidney disease with stage 1 through stage 4 chronic kidney disease, or unspecified chronic kidney disease: Secondary | ICD-10-CM | POA: Diagnosis not present

## 2014-08-06 DIAGNOSIS — N189 Chronic kidney disease, unspecified: Secondary | ICD-10-CM | POA: Diagnosis not present

## 2014-08-06 DIAGNOSIS — I6931 Cognitive deficits following cerebral infarction: Secondary | ICD-10-CM | POA: Diagnosis not present

## 2014-08-06 DIAGNOSIS — I480 Paroxysmal atrial fibrillation: Secondary | ICD-10-CM | POA: Diagnosis not present

## 2014-08-06 DIAGNOSIS — I5021 Acute systolic (congestive) heart failure: Secondary | ICD-10-CM | POA: Diagnosis not present

## 2014-08-07 ENCOUNTER — Ambulatory Visit (INDEPENDENT_AMBULATORY_CARE_PROVIDER_SITE_OTHER): Payer: Medicare Other | Admitting: Cardiovascular Disease

## 2014-08-07 DIAGNOSIS — Z5181 Encounter for therapeutic drug level monitoring: Secondary | ICD-10-CM

## 2014-08-07 DIAGNOSIS — I129 Hypertensive chronic kidney disease with stage 1 through stage 4 chronic kidney disease, or unspecified chronic kidney disease: Secondary | ICD-10-CM | POA: Diagnosis not present

## 2014-08-07 DIAGNOSIS — Z954 Presence of other heart-valve replacement: Secondary | ICD-10-CM

## 2014-08-07 DIAGNOSIS — I5021 Acute systolic (congestive) heart failure: Secondary | ICD-10-CM | POA: Diagnosis not present

## 2014-08-07 DIAGNOSIS — I6931 Cognitive deficits following cerebral infarction: Secondary | ICD-10-CM | POA: Diagnosis not present

## 2014-08-07 DIAGNOSIS — Z952 Presence of prosthetic heart valve: Secondary | ICD-10-CM

## 2014-08-07 DIAGNOSIS — N189 Chronic kidney disease, unspecified: Secondary | ICD-10-CM | POA: Diagnosis not present

## 2014-08-07 DIAGNOSIS — I480 Paroxysmal atrial fibrillation: Secondary | ICD-10-CM | POA: Diagnosis not present

## 2014-08-07 LAB — POCT INR: INR: 3.1

## 2014-08-08 ENCOUNTER — Ambulatory Visit (INDEPENDENT_AMBULATORY_CARE_PROVIDER_SITE_OTHER): Payer: Medicare Other | Admitting: Cardiology

## 2014-08-08 ENCOUNTER — Encounter: Payer: Self-pay | Admitting: Cardiology

## 2014-08-08 VITALS — BP 118/68 | HR 63 | Ht 65.0 in | Wt 144.6 lb

## 2014-08-08 DIAGNOSIS — Z952 Presence of prosthetic heart valve: Secondary | ICD-10-CM

## 2014-08-08 DIAGNOSIS — I5022 Chronic systolic (congestive) heart failure: Secondary | ICD-10-CM

## 2014-08-08 DIAGNOSIS — I059 Rheumatic mitral valve disease, unspecified: Secondary | ICD-10-CM | POA: Diagnosis not present

## 2014-08-08 DIAGNOSIS — I4819 Other persistent atrial fibrillation: Secondary | ICD-10-CM

## 2014-08-08 DIAGNOSIS — I481 Persistent atrial fibrillation: Secondary | ICD-10-CM | POA: Diagnosis not present

## 2014-08-08 DIAGNOSIS — I10 Essential (primary) hypertension: Secondary | ICD-10-CM

## 2014-08-08 DIAGNOSIS — Z954 Presence of other heart-valve replacement: Secondary | ICD-10-CM | POA: Diagnosis not present

## 2014-08-08 DIAGNOSIS — I420 Dilated cardiomyopathy: Secondary | ICD-10-CM

## 2014-08-08 LAB — COMPREHENSIVE METABOLIC PANEL
ALT: 24 U/L (ref 0–35)
AST: 27 U/L (ref 0–37)
Albumin: 4 g/dL (ref 3.5–5.2)
Alkaline Phosphatase: 82 U/L (ref 39–117)
BUN: 25 mg/dL — ABNORMAL HIGH (ref 6–23)
CO2: 30 mEq/L (ref 19–32)
Calcium: 10.3 mg/dL (ref 8.4–10.5)
Chloride: 102 mEq/L (ref 96–112)
Creatinine, Ser: 0.96 mg/dL (ref 0.40–1.20)
GFR: 74.91 mL/min (ref >60.00)
Glucose, Bld: 92 mg/dL (ref 70–99)
Potassium: 4.4 mEq/L (ref 3.5–5.1)
Sodium: 136 mEq/L (ref 135–145)
Total Bilirubin: 1 mg/dL (ref 0.2–1.2)
Total Protein: 7.9 g/dL (ref 6.0–8.3)

## 2014-08-08 NOTE — Patient Instructions (Signed)
Your physician recommends that you continue on your current medications as directed. Please refer to the Current Medication list given to you today.    Your physician has requested that you have an echocardiogram. Echocardiography is a painless test that uses sound waves to create images of your heart. It provides your doctor with information about the size and shape of your heart and how well your heart's chambers and valves are working. This procedure takes approximately one hour. There are no restrictions for this procedure.     Your physician recommends that you return for lab work in: TODAY---CMET    Your physician recommends that you schedule a follow-up appointment in: 3 MONTHS WITH DR Delton SeeNELSON

## 2014-08-08 NOTE — Progress Notes (Signed)
Patient ID: Rachel Cantu, female   DOB: 09/05/1950, 64 y.o.   MRN: [id number]      Cardiology Office Note  Date:  08/08/2014   ID:  KARMEL PATRICELLI, DOB Mar 11, 1951, MRN [id number]  PCP:  Lorretta Harp, MD  Cardiologist:  Dr. Tobias Alexander     No chief complaint on file.   History of Present Illness: Rachel Cantu is a 63 y.o. 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  Last seen by Dr. Dietrich Pates 08/2010.    Admitted 2/16-3/2 with volume overload, elevated Troponins and AKI in the setting of AFib with RVR.  TEE-DCCV was planned but TEE demonstrated LAA clot.  Echo demonstrated reduced LVF with EF 20-25% and mod RV dysfunction.  LV dysfunction was felt to be related to tachycardia, but she would need ischemic evaluation if her EF does not improve with rate control/NSR.  Rate control was adjusted.  Elevated Troponin was thought to be 2/2 demand ischemia. Diltiazem was initially used and then DC'd 2/2 low EF.  HR control was problematic (no Amiodarone due to LAA clot) and she developed worsening pulmonary edema.  She was followed by CCM as well in the ICU.  Respiratory status improved with diuresis.  Hospital course was complicated by ongoing confusion.  She was seen by Neurology.  Head CT was done and repeated without change.  MRI could not be done due to mechanical MVR.  It was suspected she had showering of emboli to her brain to account for MS changes.  She was ultimately DC to SNF.  She returns for FU.  She is currently staying at [facility].  She is here today with her husband.  Her daughter is also on speaker phone.  She is working with PT and she feels pretty good.  She denies significant DOE.  She sleeps on 2-3 pillows.  She denies PND.  She denies chest pain, dizziness, syncope, palpitations.  She notes LE edema.  This is overall stable.  She denies cough or wheezing.    08/07/2014 - the patient is coming after 1 months, since the last visit  she has lost 15 pounds, and feels significantly better. She denies any orthopnea, paroxysmal nocturnal dyspnea. She denies any palpitations or syncope. Her lower extremity edema has improved. She is compliant with her medicines and has no complication in regards to bleeding. Her last 2 INRs were normal.   Studies/Reports Reviewed Today:  Echo 05/29/14 - Mild focal basal hypertrophy of the septum. EF 20-25%. Diffuse hypokinesis. - Ventricular septum: Septal motion showed paradox. - Aortic valve: There was mild regurgitation. - Mitral valve: A mechanical prosthesis was present and functioning normally. The prosthesis had a normal range of motion. The sewing ring appeared normal, had no rocking motion, and showed no evidence of dehiscence. - Left atrium: The atrium was moderately dilated. - Right ventricle: The cavity size was moderately dilated. Wall thickness was normal. Systolic function was moderately reduced. - Right atrium: The atrium was moderately dilated. - Tricuspid valve: There was moderate regurgitation. - PA peak pressure: 44 mm Hg (S).   TEE 05/30/14 - EF 25-30%.  Diffuse hypokinesis. - Aortic valve: No evidence of vegetation. There was trivial regurgitation. - Mitral valve: A mechanical prosthesis was present. There was mild regurgitation. - Left atrium: The atrium was moderately dilated. There was an apparent, large thrombusin the appendage. There was mild spontaneous echo contrast (&quot;smoke&quot;) in the cavity. -  Right ventricle: The cavity size was mildly dilated. Systolic function was severely reduced. - Right atrium: The atrium was mildly dilated. No evidence of thrombus in the atrial cavity or appendage. - Atrial septum: No defect or patent foramen ovale was identified. - Tricuspid valve: No evidence of vegetation. There was   moderate-severe regurgitation. - Pulmonic valve: No evidence of vegetation.    Past Medical History  Diagnosis Date  . Hypertension   .  History of chickenpox   . S/P MVR (mitral valve replacement) 1994  . Rheumatic fever     as a child  . Heart murmur   . Atrial fibrillation with RVR 05/29/2014    Rachel Cantu 05/29/2014    Past Surgical History  Procedure Laterality Date  . Mitral valve replacement  1994  . Tee without cardioversion  05/30/2014  . Tee without cardioversion N/A 05/30/2014    Procedure: TRANSESOPHAGEAL ECHOCARDIOGRAM (TEE);  Surgeon: Lewayne Bunting, MD;  Location: [facility] ENDOSCOPY;  Service: Cardiovascular;  Laterality: N/A;     Current Outpatient Prescriptions  Medication Sig Dispense Refill  . COUMADIN 5 MG tablet AS DIRECTED BY COUMADIN CLINIC  0  . digoxin (LANOXIN) 0.125 MG tablet Take 1 tablet (0.125 mg total) by mouth daily. 30 tablet 5  . donepezil (ARICEPT) 10 MG tablet Take 1 tablet (10 mg total) by mouth at bedtime. 30 tablet 3  . furosemide (LASIX) 40 MG tablet Take 1.5 tablets (60 mg total) by mouth daily. 45 tablet 5  . metoprolol (LOPRESSOR) 50 MG tablet Take 1 tablet (50 mg total) by mouth 2 (two) times daily. 60 tablet 5  . Potassium Chloride ER 20 MEQ TBCR Take 20 mEq by mouth daily. 60 tablet 5  . warfarin (COUMADIN) 7.5 MG tablet Take 1 tablet (7.5 mg total) by mouth as directed. 30 tablet 3   No current facility-administered medications for this visit.   Allergies:   Sulfamethoxazole   Social History:  The patient  reports that she has never smoked. She has never used smokeless tobacco. She reports that she does not drink alcohol or use illicit drugs.   Family History:  The patient's family history includes Alzheimer's disease in her mother; Heart disease (age of onset: 57) in her father. There is no history of Heart attack or Stroke.   ROS:   Please see the history of present illness.   Review of Systems  Constitution: Negative for fever.  Respiratory: Negative for cough.   Gastrointestinal: Negative for diarrhea, hematochezia, melena and vomiting.  Genitourinary: Negative for  hematuria.  All other systems reviewed and are negative.    PHYSICAL EXAM: VS:  BP 118/68 mmHg  Pulse 63  Ht  (1.651 m)  Wt 144 lb 9.6 oz (65.59 kg)  BMI 24.06 kg/m2  SpO2 99%    Wt Readings from Last 3 Encounters:  08/08/14 144 lb 9.6 oz (65.59 kg)  07/29/14 145 lb 4.8 oz (65.908 kg)  07/22/14 146 lb 12.8 oz (66.588 kg)    GEN: Well nourished, well developed, in no acute distress HEENT: normal Neck: + JVD, no masses Cardiac:  Normal S1/Mechanical S2, irreg irreg rhythm; no murmur ,  no rubs or gallops, 1+ edema  Respiratory:  clear to auscultation bilaterally, no wheezing, rhonchi or rales. GI: soft, nontender, nondistended, + BS MS: no deformity or atrophy Skin: warm and dry  Neuro:  CNs II-XII intact, Strength and sensation are intact Psych: Normal affect  EKG:  EKG is ordered today.  It demonstrates:  AFib, HR 74, inf-lat TWI  Recent Labs: 05/29/2014: TSH 1.494 06/06/2014: ALT 36* 06/08/2014: Hemoglobin 15.0; Platelets 173 06/12/2014: BUN 24*; Creatinine 0.98; Potassium 4.1; Sodium 138    Lipid Panel    Component Value Date/Time   CHOL 244* 11/23/2011 0849   TRIG 167.0* 11/23/2011 0849   HDL 54.40 11/23/2011 0849   CHOLHDL 4 11/23/2011 0849   VLDL 33.4 11/23/2011 0849   LDLCALC 115* 06/13/2007 1128   LDLDIRECT 167.7 11/23/2011 0849      ASSESSMENT AND PLAN:  1. Persistent atrial fibrillation She remains in AFib with controlled VR.  [facility].  She had a large LAA clot at TEE in the hospital and therefore no DCCV was done.  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  Restoring NSR would likely be the most beneficial.  We need to confirm her INR is 2.5-3.5 for at least 4 weeks before attempting TEE-DCCV again. Also I wouldn't perform cardioversion earlier than 3 months since the initial TEE to give time for the clot resolve. If her INRs normal I will schedule the cardioversion in May. I would continue digoxin for rate control and  metoprolol for now.    2. Chronic systolic heart failure She now appears euvolemic she has lost significant amount of fluid, we will check her labs today and plan for decreasing Lasix to 40 mg by mouth twice a day and also potentially decreasing potassium to 10 mEq daily.   3. Cardiomyopathy Likely tachycardia mediated.  If EF remains low after rate control/NSR, consider ischemic evaluation.  As noted, Lasix is being adjusted.  Will monitor renal function closely.  If renal function remains stable at FU, consider adding ACE inhibitor.  We will repeat echocardiogram now.  4. Essential hypertension Controlled.   5. S/P MVR (mitral valve replacement) Reviewed with Dr. Tobias AlexanderKatarina Abie Cheek after the patient left the office.  She should be on ASA in addition to coumadin with a mechanical valve prosthesis.      -  Will call the SNF and start ASA 81 mg QD.    -  Coumadin managed by SNF.  This will be managed by PCP after DC.  6. 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  Patient's daughter questioned why an MRI was not done.  She was under the impression that her valve was MRI compatible.  I did some research after the patient left the office and reviewed with some of the physicians in the office.  Overall, MRIs are generally felt to be safe in patient's with mechanical heart valves.  However, at this point, an MRI would not likely change her outcome or treatment.  At this time, I would not recommend proceeding with an MRI.  Follow up in 3 months, comprehensive metabolic profile today, adjust diuretics per lab results, scheduled he cardioversion in May.  Signed, Brynda RimScott Weaver, PA-C, MHS 08/08/2014 11:00 AM    [facility] [address], [place], [place]  [address] Phone: [phone]) [phone]; Fax: [phone]) [phone]

## 2014-08-13 DIAGNOSIS — N189 Chronic kidney disease, unspecified: Secondary | ICD-10-CM | POA: Diagnosis not present

## 2014-08-13 DIAGNOSIS — I6931 Cognitive deficits following cerebral infarction: Secondary | ICD-10-CM | POA: Diagnosis not present

## 2014-08-13 DIAGNOSIS — I129 Hypertensive chronic kidney disease with stage 1 through stage 4 chronic kidney disease, or unspecified chronic kidney disease: Secondary | ICD-10-CM | POA: Diagnosis not present

## 2014-08-13 DIAGNOSIS — I5021 Acute systolic (congestive) heart failure: Secondary | ICD-10-CM | POA: Diagnosis not present

## 2014-08-13 DIAGNOSIS — I480 Paroxysmal atrial fibrillation: Secondary | ICD-10-CM | POA: Diagnosis not present

## 2014-08-14 DIAGNOSIS — N189 Chronic kidney disease, unspecified: Secondary | ICD-10-CM | POA: Diagnosis not present

## 2014-08-14 DIAGNOSIS — I129 Hypertensive chronic kidney disease with stage 1 through stage 4 chronic kidney disease, or unspecified chronic kidney disease: Secondary | ICD-10-CM | POA: Diagnosis not present

## 2014-08-14 DIAGNOSIS — I5021 Acute systolic (congestive) heart failure: Secondary | ICD-10-CM | POA: Diagnosis not present

## 2014-08-14 DIAGNOSIS — I480 Paroxysmal atrial fibrillation: Secondary | ICD-10-CM | POA: Diagnosis not present

## 2014-08-14 DIAGNOSIS — I6931 Cognitive deficits following cerebral infarction: Secondary | ICD-10-CM | POA: Diagnosis not present

## 2014-08-15 ENCOUNTER — Ambulatory Visit (HOSPITAL_COMMUNITY): Payer: Medicare Other | Attending: Internal Medicine

## 2014-08-15 DIAGNOSIS — E785 Hyperlipidemia, unspecified: Secondary | ICD-10-CM | POA: Diagnosis not present

## 2014-08-15 DIAGNOSIS — I059 Rheumatic mitral valve disease, unspecified: Secondary | ICD-10-CM | POA: Insufficient documentation

## 2014-08-15 DIAGNOSIS — I10 Essential (primary) hypertension: Secondary | ICD-10-CM | POA: Insufficient documentation

## 2014-08-15 NOTE — Progress Notes (Signed)
2D Echo completed. 08/15/2014 

## 2014-08-16 ENCOUNTER — Telehealth: Payer: Self-pay | Admitting: *Deleted

## 2014-08-16 DIAGNOSIS — I480 Paroxysmal atrial fibrillation: Secondary | ICD-10-CM

## 2014-08-16 DIAGNOSIS — I5043 Acute on chronic combined systolic (congestive) and diastolic (congestive) heart failure: Secondary | ICD-10-CM

## 2014-08-16 DIAGNOSIS — I059 Rheumatic mitral valve disease, unspecified: Secondary | ICD-10-CM

## 2014-08-16 DIAGNOSIS — I4891 Unspecified atrial fibrillation: Secondary | ICD-10-CM

## 2014-08-16 DIAGNOSIS — N289 Disorder of kidney and ureter, unspecified: Secondary | ICD-10-CM

## 2014-08-16 DIAGNOSIS — N19 Unspecified kidney failure: Secondary | ICD-10-CM

## 2014-08-16 DIAGNOSIS — I10 Essential (primary) hypertension: Secondary | ICD-10-CM

## 2014-08-16 NOTE — Telephone Encounter (Signed)
-----   Message from Lars MassonKatarina H Nelson, MD sent at 08/16/2014  1:02 PM EDT ----- She needs to be referred to EP clinic for consideration of an ICD implantation. Please schedule it. Thank you, KN

## 2014-08-16 NOTE — Telephone Encounter (Signed)
Referred to EP Clinic for consideration of ICD implantation. Routed to BorgWarnerMelissa Tatum for scheduling purposes.

## 2014-08-20 ENCOUNTER — Telehealth: Payer: Self-pay | Admitting: Cardiology

## 2014-08-20 MED ORDER — POTASSIUM CHLORIDE ER 10 MEQ PO TBCR: 10.0000 meq | EXTENDED_RELEASE_TABLET | Freq: Every day | ORAL | Status: DC

## 2014-08-20 MED ORDER — FUROSEMIDE 40 MG PO TABS: 40.0000 mg | ORAL_TABLET | Freq: Every day | ORAL | Status: DC

## 2014-08-20 NOTE — Telephone Encounter (Signed)
Contacted the pt and daughter (on [place]) to inform that per Dr Delton SeeNelson the pts echo revealed that she should be referred to EP for consideration of ICD implantation.  Informed the pt and Daughter that the pt is scheduled to see Dr Johney FrameAllred in EP for 08/29/14 at 0845 per his scheduler.    Also informed the pt and the daughter that per Dr Delton SeeNelson the pts labs were normal and she can now decrease her lasix to 60 mg once daily and KCL to 10 mEq po daily. Per the pts med list, she is already taking 60 mg of Lasix.  Clarification order obtained per Dr Delton SeeNelson, for the pt to decrease her Lasix to 40 mg po daily (1 tablet only), and decrease her KCL to 10 mEq po daily. Informed the pt and daughter that both med changes were called into the pts pharmacy of choice.    Per the pts Daughter she would like to keep the scheduled appt with Dr Johney FrameAllred for 5/19, but did inform that the pt will be moving to [place] with her in a month or so, so the consideration of an ICD, would possibly have to be done by the [facility] in [place].  Informed the daughter that, that would be ok, and when she does transition the pt to another state and different Cardiologist, she can have them send a form with given permission for our group to release any of the pts medical records.   Daughter and pt verbalized understanding and agrees with this plan.

## 2014-08-20 NOTE — Telephone Encounter (Signed)
Contacted the pt and daughter (on DPR) to inform that per Dr Nelson the pts echo revealed that she should be referred to EP for consideration of ICD implantation.  Informed the pt and Daughter that the pt is scheduled to see Dr Allred in EP for 08/29/14 at 0845 per his scheduler.    Also informed the pt and the daughter that per Dr Nelson the pts labs were normal and she can now decrease her lasix to 60 mg once daily and KCL to 10 mEq po daily. Per the pts med list, she is already taking 60 mg of Lasix.  Clarification order obtained per Dr Nelson, for the pt to decrease her Lasix to 40 mg po daily (1 tablet only), and decrease her KCL to 10 mEq po daily. Informed the pt and daughter that both med changes were called into the pts pharmacy of choice.    Per the pts Daughter she would like to keep the scheduled appt with Dr Allred for 5/19, but did inform that the pt will be moving to [place] with her in a month or so, so the consideration of an ICD, would possibly have to be done by the [facility] in [place].  Informed the daughter that, that would be ok, and when she does transition the pt to another state and different Cardiologist, she can have them send a form with given permission for our group to release any of the pts medical records.   Daughter and pt verbalized understanding and agrees with this plan.  

## 2014-08-20 NOTE — Telephone Encounter (Signed)
New message      Daughter is calling to find out what the nurse told her mother.  Mother is unable to explain to daughter.  Daughter says her mom has signed a paper giving us permission to talk to her a couple of months ago.

## 2014-08-20 NOTE — Telephone Encounter (Signed)
-----   Message from Lars MassonKatarina H Nelson, MD sent at 08/08/2014  3:24 PM EDT ----- Normal labs, decrease lasix to 60 mg po daily and KCl to 10 mEq daily.

## 2014-08-21 ENCOUNTER — Ambulatory Visit (INDEPENDENT_AMBULATORY_CARE_PROVIDER_SITE_OTHER): Payer: Medicare Other | Admitting: Cardiology

## 2014-08-21 DIAGNOSIS — Z5181 Encounter for therapeutic drug level monitoring: Secondary | ICD-10-CM

## 2014-08-21 DIAGNOSIS — I5021 Acute systolic (congestive) heart failure: Secondary | ICD-10-CM | POA: Diagnosis not present

## 2014-08-21 DIAGNOSIS — Z952 Presence of prosthetic heart valve: Secondary | ICD-10-CM

## 2014-08-21 DIAGNOSIS — I6931 Cognitive deficits following cerebral infarction: Secondary | ICD-10-CM | POA: Diagnosis not present

## 2014-08-21 DIAGNOSIS — N189 Chronic kidney disease, unspecified: Secondary | ICD-10-CM | POA: Diagnosis not present

## 2014-08-21 DIAGNOSIS — I129 Hypertensive chronic kidney disease with stage 1 through stage 4 chronic kidney disease, or unspecified chronic kidney disease: Secondary | ICD-10-CM | POA: Diagnosis not present

## 2014-08-21 DIAGNOSIS — Z954 Presence of other heart-valve replacement: Secondary | ICD-10-CM

## 2014-08-21 DIAGNOSIS — I480 Paroxysmal atrial fibrillation: Secondary | ICD-10-CM | POA: Diagnosis not present

## 2014-08-21 LAB — POCT INR: INR: 2.9

## 2014-08-26 ENCOUNTER — Other Ambulatory Visit: Payer: Self-pay

## 2014-08-29 ENCOUNTER — Encounter: Payer: Self-pay | Admitting: Internal Medicine

## 2014-08-29 ENCOUNTER — Ambulatory Visit (INDEPENDENT_AMBULATORY_CARE_PROVIDER_SITE_OTHER): Payer: Medicare Other | Admitting: Internal Medicine

## 2014-08-29 VITALS — BP 120/84 | HR 79 | Ht 64.0 in | Wt 145.6 lb

## 2014-08-29 DIAGNOSIS — I519 Heart disease, unspecified: Secondary | ICD-10-CM | POA: Diagnosis not present

## 2014-08-29 DIAGNOSIS — I10 Essential (primary) hypertension: Secondary | ICD-10-CM

## 2014-08-29 DIAGNOSIS — Z7901 Long term (current) use of anticoagulants: Secondary | ICD-10-CM

## 2014-08-29 DIAGNOSIS — I513 Intracardiac thrombosis, not elsewhere classified: Secondary | ICD-10-CM

## 2014-08-29 DIAGNOSIS — I213 ST elevation (STEMI) myocardial infarction of unspecified site: Secondary | ICD-10-CM

## 2014-08-29 NOTE — Progress Notes (Signed)
Electrophysiology Office Note   Date:  08/30/2014   ID:  Rachel Cantu, DOB Oct 23, 1950, MRN [id number]  PCP:  Lorretta HarpPANOSH,WANDA KOTVAN, MD  Cardiologist:  Dr Delton SeeNelson Primary Electrophysiologist: Hillis RangeJames Jachob Mcclean, MD    Chief Complaint  Patient presents with  . Atrial Fibrillation     History of Present Illness: Rachel Cantu is a 64 y.o. female who presents today for electrophysiology evaluation.  I have known Ms Killingsworth for several years (as I previously provided care to her mother).   I saw her during her hospitalization in February.  At that time, she presented with acute CHF in the setting of AF with RVR.  She could not be cardioverted due to LA thrombus and rate control was advised.  She was felt to have encephalopathy during the hospitalization though I saw her at that time and felt that she was actually close to her baseline.  She did spend some time in SNF at discharge. She has difficulty chronically providing history.  She is quite tangential at times and requires very frequent redirection by MD and also her family members.  She is referred today for further EP evaluation though she is moving to [place] next week and has very little interest in making any changes at this time. She reports "I feel great" at this time.  She feels that her CHF symptoms have resolved and that she is close to her baseline.   Today, she denies symptoms of palpitations, chest pain, shortness of breath, orthopnea, PND, lower extremity edema, claudication, dizziness, presyncope, syncope, bleeding, or neurologic sequela. The patient is tolerating medications without difficulties and is otherwise without complaint today.    Past Medical History  Diagnosis Date  . Hypertension   . History of chickenpox   . S/P MVR (mitral valve replacement) 1994  . Rheumatic fever     as a child  . Heart murmur   . Atrial fibrillation with RVR 05/29/2014    Hattie Perch/notes 05/29/2014  . Left atrial enlargement     moderate  .  Thrombus of left atrial appendage   . Chronic systolic CHF (congestive heart failure)    Past Surgical History  Procedure Laterality Date  . Mitral valve replacement  1994  . Tee without cardioversion  05/30/2014  . Tee without cardioversion N/A 05/30/2014    Procedure: TRANSESOPHAGEAL ECHOCARDIOGRAM (TEE);  Surgeon: Lewayne BuntingBrian S Crenshaw, MD;  [facility];  Service: Cardiovascular;  Laterality: N/A;     Current Outpatient Prescriptions  Medication Sig Dispense Refill  . COUMADIN 5 MG tablet AS DIRECTED BY COUMADIN CLINIC  0  . digoxin (LANOXIN) 0.125 MG tablet Take 1 tablet (0.125 mg total) by mouth daily. 30 tablet 5  . donepezil (ARICEPT) 10 MG tablet Take 1 tablet (10 mg total) by mouth at bedtime. 30 tablet 3  . furosemide (LASIX) 40 MG tablet Take 1 tablet (40 mg total) by mouth daily. 90 tablet 3  . metoprolol (LOPRESSOR) 50 MG tablet Take 1 tablet (50 mg total) by mouth 2 (two) times daily. 60 tablet 5  . potassium chloride (K-DUR) 10 MEQ tablet Take 1 tablet (10 mEq total) by mouth daily. 90 tablet 3  . warfarin (COUMADIN) 7.5 MG tablet Take 1 tablet (7.5 mg total) by mouth as directed. 30 tablet 3   No current facility-administered medications for this visit.    Allergies:   Sulfamethoxazole   Social History:  The patient  reports that she has never smoked. She has never used smokeless  tobacco. She reports that she does not drink alcohol or use illicit drugs.   Family History:  The patients  family history includes Alzheimer's disease in her mother; Heart disease (age of onset: 4846) in her father. There is no history of Heart attack or Stroke.    ROS:  Please see the history of present illness.   All other systems are reviewed and negative.    PHYSICAL EXAM: VS:  BP 120/84 mmHg  Pulse 79  Ht 5\' 4"  (1.626 m)  Wt 145 lb 9.6 oz (66.044 kg)  BMI 24.98 kg/m2 , BMI Body mass index is 24.98 kg/(m^2). GEN: Well nourished, well developed, in no acute distress, pleasant  but requiring frequent redirection HEENT: normal Neck: no JVD, carotid bruits, or masses Cardiac: iRRR; no murmurs, rubs, or gallops,no edema  Respiratory:  clear to auscultation bilaterally, normal work of breathing GI: soft, nontender, nondistended, + BS MS: no deformity or atrophy Skin: warm and dry  Neuro:  Strength and sensation are intact Psych: euthymic mood, full affect  EKG:  EKG is ordered today. The ekg ordered today shows afib, V rate 79 bpm, nonspecific St/T changes   Recent Labs: 05/29/2014: TSH 1.494 06/08/2014: Hemoglobin 15.0; Platelets 173 08/08/2014: ALT 24; BUN 25*; Creatinine 0.96; Potassium 4.4; Sodium 136    Lipid Panel     Component Value Date/Time   CHOL 244* 11/23/2011 0849   TRIG 167.0* 11/23/2011 0849   HDL 54.40 11/23/2011 0849   CHOLHDL 4 11/23/2011 0849   VLDL 33.4 11/23/2011 0849   LDLCALC 115* 06/13/2007 1128   LDLDIRECT 167.7 11/23/2011 0849     Wt Readings from Last 3 Encounters:  08/29/14 145 lb 9.6 oz (66.044 kg)  08/08/14 144 lb 9.6 oz (65.59 kg)  07/29/14 145 lb 4.8 oz (65.908 kg)      Other studies Reviewed: Additional studies/ records that were reviewed today include: Dr Joanie CoddingtonNelsons notes and hospital records Review of the above records today demonstrates:  EF 15-20%   ASSESSMENT AND PLAN:  1.  Persistent afib The patient has valvular atrial fibrillation.  Given her structural heart disease and atriopathy, I think that our ability to maintain sinus rhythm long term is low.  She was recently felt to have a LAA thrombus.  She has since been compliant with anticoagulation. Our AAD options are limited with her reduced EF.  She would like to avoid AAD therapy at this time.  We will therefore continue rate control. I have advised coreg though she prefers to continue metoprolol until she is seen to establish with a cardiologist in [place].  Her chads2vasc score is at least 4.  IN addition, she has a LAA thrombus recently.  I would therefore  recommend lifelong anticoagulation.  2. Chronic systolic dysfunction EF is reduced.  I would advise aggressive medical therapy.  I have discussed switching metoprolol to coreg and adding an ACE inhibitor.  She declines and would rather wait until she is seen to establish with a cardiologist in [place].  Given her cognitive issues, I think that she would be a very poor candidate for ICD/ other EP procedures in the future.  I think that a more conservative option would be preferred.  3. HTN Stable No change required today  4. LAA thrombus As above   Current medicines are reviewed at length with the patient today.   The patient does not have concerns regarding her medicines.  The following changes were made today: none  As she is moving next  week to [facility], I will see as needed going forward.  Today, I have spent 40 minutes with the patient discussing afib.  More than 50% of the visit time today was spent on this issue. This includes an extended conversation with her daughter by phone.     Randolm Idol, MD  08/30/2014 9:01 PM     [facility] [address] [place] [address] [phone] (office) [phone] (fax)

## 2014-08-29 NOTE — Patient Instructions (Signed)
Medication Instructions:  Your physician recommends that you continue on your current medications as directed. Please refer to the Current Medication list given to you today.   Labwork: None ordered  Testing/Procedures: None orderded  Follow-Up: Your physician recommends that you schedule a follow-up appointment as needed with Dr Johney FrameAllred   Any Other Special Instructions Will Be Listed Below (If Applicable).

## 2014-08-30 ENCOUNTER — Encounter: Payer: Self-pay | Admitting: Internal Medicine

## 2014-08-30 DIAGNOSIS — I519 Heart disease, unspecified: Secondary | ICD-10-CM | POA: Insufficient documentation

## 2014-09-04 ENCOUNTER — Ambulatory Visit (INDEPENDENT_AMBULATORY_CARE_PROVIDER_SITE_OTHER): Payer: Medicare Other | Admitting: Cardiovascular Disease

## 2014-09-04 DIAGNOSIS — N189 Chronic kidney disease, unspecified: Secondary | ICD-10-CM | POA: Diagnosis not present

## 2014-09-04 DIAGNOSIS — Z952 Presence of prosthetic heart valve: Secondary | ICD-10-CM

## 2014-09-04 DIAGNOSIS — I5021 Acute systolic (congestive) heart failure: Secondary | ICD-10-CM | POA: Diagnosis not present

## 2014-09-04 DIAGNOSIS — I480 Paroxysmal atrial fibrillation: Secondary | ICD-10-CM | POA: Diagnosis not present

## 2014-09-04 DIAGNOSIS — Z5181 Encounter for therapeutic drug level monitoring: Secondary | ICD-10-CM

## 2014-09-04 DIAGNOSIS — I129 Hypertensive chronic kidney disease with stage 1 through stage 4 chronic kidney disease, or unspecified chronic kidney disease: Secondary | ICD-10-CM | POA: Diagnosis not present

## 2014-09-04 DIAGNOSIS — Z954 Presence of other heart-valve replacement: Secondary | ICD-10-CM

## 2014-09-04 DIAGNOSIS — I6931 Cognitive deficits following cerebral infarction: Secondary | ICD-10-CM | POA: Diagnosis not present

## 2014-09-04 LAB — POCT INR: INR: 3.3

## 2014-09-05 ENCOUNTER — Telehealth: Payer: Self-pay | Admitting: Cardiology

## 2014-09-05 NOTE — Telephone Encounter (Signed)
Informed the [facility] that we did refax the order back to her on 5/24 with Dr Lindaann SloughNelson's signature on each paper.  Derwood Kaplandvised Carolyn [facility] to refax this paperwork right now so Dr Delton SeeNelson can resign the orders, for she is in the office right now, and only for a short time.  Eber JonesCarolyn [facility] verbalized understanding and agrees with this plan.

## 2014-09-05 NOTE — Telephone Encounter (Signed)
New Message      Calling stating that she received orders back from our office but there was a stamped signature on them and they can't accept that. They faxed back over on 08/29/14 and wants to know when she can get those faxed back to her with Dr. Lindaann SloughNelson's signature. Please call back and advise.

## 2014-09-08 NOTE — Progress Notes (Signed)
Patient ID: Rachel Cantu, female   DOB: Aug 17, 1950, 64 y.o.   MRN: [id number]  [name] living [place]     Allergies  Allergen Reactions  . Sulfamethoxazole     UNKNOWN       Chief Complaint  Patient presents with  . Discharge Note    HPI:  She is being discharged to home with home health for st/pt/rn for cognition; home safety and inr management. She will not need dme. She will need her prescriptions to be written and will need a follow up with her pcp.    Past Medical History  Diagnosis Date  . Hypertension   . History of chickenpox   . S/P MVR (mitral valve replacement) 1994  . Rheumatic fever     as a child  . Heart murmur   . Atrial fibrillation with RVR 05/29/2014    Archie Endo 05/29/2014  . Left atrial enlargement     moderate  . Thrombus of left atrial appendage   . Chronic systolic CHF (congestive heart failure)     Past Surgical History  Procedure Laterality Date  . Mitral valve replacement  1994  . Tee without cardioversion  05/30/2014  . Tee without cardioversion N/A 05/30/2014    Procedure: TRANSESOPHAGEAL ECHOCARDIOGRAM (TEE);  Surgeon: Lelon Perla, MD;  Location: [facility] ENDOSCOPY;  Service: Cardiovascular;  Laterality: N/A;    VITAL SIGNS BP 130/58 mmHg  Pulse 68  Ht _0  (1.626 m)  Wt 159 lb (72.122 kg)  BMI 27.28 kg/m2  SpO2 98%   Outpatient Encounter Prescriptions as of 07/02/2014  Medication Sig   . digoxin (LANOXIN) 0.125 MG tablet Take 1 tablet (0.125 mg total) by mouth daily.  . metoprolol (LOPRESSOR) 50 MG tablet Take 1 tablet (50 mg total) by mouth 2 (two) times daily.   protonix 40 mg  Take daily    k dur 10 meq  Take daily    Lasix 40 mg  Take daily   . warfarin (COUMADIN) 5 MG tablet  take 7.5 mg daily       SIGNIFICANT DIAGNOSTIC EXAMS    05-28-14: chest x-ray: Marked cardiomegaly.  Probable mild congestive heart failure.  05-29-14: renal ultrasound: Normal appearance of the kidneys. No explanation for  patient's renal failure.  05-29-14: 2-d echo: Left ventricle: The cavity size was normal. D-shaped septum consistent with right ventricular pressure/ volume overload. There was mild focal basal hypertrophy of the septum. Systolic function was severely reduced. The estimated ejection fraction was in the range of 20% to 25%. Diffuse hypokinesis. - Ventricular septum: Septal motion showed paradox. - Aortic valve: There was mild regurgitation. - Mitral valve: A mechanical prosthesis was present and functioning normally. The prosthesis had a normal range of motion. The sewing ring appeared normal, had no rocking motion, and showed no evidence of dehiscence. - Left atrium: The atrium was moderately dilated. - Right ventricle: The cavity size was moderately dilated. Wall thickness was normal. Systolic function was moderately reduced. - Right atrium: The atrium was moderately dilated. - Tricuspid valve: There was moderate regurgitation. - Pulmonary arteries: Systolic pressure was moderately increased.    06-02-14: chest x-ray: Congestive heart failure  06-03-14: ct of head: Atrophy, chronic small vessel ischemia, and remote prior infarct. No CT findings of acute intracranial abnormality.  06-06-14: ct of head: 1.  No acute intracranial abnormality identified. 2. Stable and chronic appearing bilateral PCA and cerebellar ischemia. 3. Superficial/dermal scalp soft tissue lesion at the left vertex,  06-09-14: chest x-ray:  Persistent vascular congestion and changes in the right base. No other focal abnormality is noted    LABS REVIEWED:   05-28-14: wbc 11.7; hct 15.2; hct 44.8; mcv 92.2; plt 133; glucose 154; bun 54; creat 2.52; k+4.2; na++135; ast 60; alt 48; t bili 2.2; albumin 3.0 05-29-14: tsh 1.494 06-02-14: wbc 7.8; hgb 14.9; hct 43.5 ;mcv 92.6; plt 139; glucose 131; bun 16; creat 0.94 ;k+3.9; na++137 06-05-14: dig 1.0 06-06-14: glucose 1132; bun 21; creat 0.88; k+4.4; na++137; ast 21; alt 36; t bili  1.3; alk phos 134; albumin 3.1; sed rate 1 06-12-14: glucose 185; bun 24; creat 0.98; k+4.1; na++138  06-13-14; inr 2.292  06-17-14: inr: 2.07: coumadin 7.5 mg daily  07-01-14: inr 2.69     ROS Constitutional: Negative for malaise/fatigue.  Respiratory: Negative for cough and shortness of breath.   Cardiovascular: Negative for chest pain and palpitations.  Gastrointestinal: Negative for heartburn, abdominal pain and constipation.  Musculoskeletal: Negative for myalgias and joint pain.  Skin: Negative.   Psychiatric/Behavioral: The patient is not nervous/anxious.       Physical Exam Constitutional: She appears well-developed and well-nourished. No distress.  Neck: Neck supple. No JVD present. No thyromegaly present.  Cardiovascular: Normal rate, regular rhythm and intact distal pulses.   Respiratory: Effort normal and breath sounds normal. No respiratory distress.  GI: Soft. Bowel sounds are normal. She exhibits no distension. There is no tenderness.  Musculoskeletal: Normal range of motion. She exhibits edema.  2+ lower extremity edema   Neurological: She is alert.  Skin: Skin is warm and dry. She is not diaphoretic.     ASSESSMENT/ PLAN:  Will discharge her to home with home health for st/pt/rn to evaluate and treat as indicated for cognition; home safety and inr management. Her prescriptions have been written for a 30 day supply of her medications. She has a follow up with her pcp: Dr. Regis Bill on 07-22-14 at 1:45 pm  Time spent with patient 45 minutes.   Ok Edwards NP [facility]  Contact [phone] Monday through Friday 8am- 5pm  After hours call [phone]

## 2014-09-18 ENCOUNTER — Ambulatory Visit (INDEPENDENT_AMBULATORY_CARE_PROVIDER_SITE_OTHER): Payer: Medicare Other | Admitting: *Deleted

## 2014-09-18 DIAGNOSIS — Z5181 Encounter for therapeutic drug level monitoring: Secondary | ICD-10-CM | POA: Diagnosis not present

## 2014-09-18 DIAGNOSIS — Z954 Presence of other heart-valve replacement: Secondary | ICD-10-CM | POA: Diagnosis not present

## 2014-09-18 DIAGNOSIS — I059 Rheumatic mitral valve disease, unspecified: Secondary | ICD-10-CM

## 2014-09-18 DIAGNOSIS — Z952 Presence of prosthetic heart valve: Secondary | ICD-10-CM

## 2014-09-18 LAB — POCT INR: INR: 2.1

## 2014-09-23 ENCOUNTER — Encounter: Payer: Medicare Other | Admitting: Internal Medicine

## 2014-09-23 DIAGNOSIS — Z0289 Encounter for other administrative examinations: Secondary | ICD-10-CM

## 2014-09-23 NOTE — Progress Notes (Signed)
Document opened and reviewed for Ovisit . No showed . Pt moving to [place] .

## 2014-09-25 ENCOUNTER — Other Ambulatory Visit: Payer: Self-pay | Admitting: Adult Health

## 2014-09-25 ENCOUNTER — Encounter: Payer: Self-pay | Admitting: Cardiology

## 2014-10-15 DIAGNOSIS — I639 Cerebral infarction, unspecified: Secondary | ICD-10-CM | POA: Diagnosis not present

## 2014-10-15 DIAGNOSIS — Z5181 Encounter for therapeutic drug level monitoring: Secondary | ICD-10-CM | POA: Diagnosis not present

## 2014-10-15 DIAGNOSIS — Z952 Presence of prosthetic heart valve: Secondary | ICD-10-CM | POA: Diagnosis not present

## 2014-10-15 DIAGNOSIS — I509 Heart failure, unspecified: Secondary | ICD-10-CM | POA: Diagnosis not present

## 2014-10-15 DIAGNOSIS — I4891 Unspecified atrial fibrillation: Secondary | ICD-10-CM | POA: Diagnosis not present

## 2014-10-24 DIAGNOSIS — Z7901 Long term (current) use of anticoagulants: Secondary | ICD-10-CM | POA: Diagnosis not present

## 2014-10-24 DIAGNOSIS — Z952 Presence of prosthetic heart valve: Secondary | ICD-10-CM | POA: Diagnosis not present

## 2014-10-24 DIAGNOSIS — Z5181 Encounter for therapeutic drug level monitoring: Secondary | ICD-10-CM | POA: Diagnosis not present

## 2014-10-30 DIAGNOSIS — Z7901 Long term (current) use of anticoagulants: Secondary | ICD-10-CM | POA: Diagnosis not present

## 2014-10-30 DIAGNOSIS — Z952 Presence of prosthetic heart valve: Secondary | ICD-10-CM | POA: Diagnosis not present

## 2014-10-30 DIAGNOSIS — Z5181 Encounter for therapeutic drug level monitoring: Secondary | ICD-10-CM | POA: Diagnosis not present

## 2014-11-07 DIAGNOSIS — Z5181 Encounter for therapeutic drug level monitoring: Secondary | ICD-10-CM | POA: Diagnosis not present

## 2014-11-07 DIAGNOSIS — Z952 Presence of prosthetic heart valve: Secondary | ICD-10-CM | POA: Diagnosis not present

## 2014-11-07 DIAGNOSIS — Z7901 Long term (current) use of anticoagulants: Secondary | ICD-10-CM | POA: Diagnosis not present

## 2014-11-11 ENCOUNTER — Ambulatory Visit: Payer: Medicare Other | Admitting: Cardiology

## 2014-11-12 DIAGNOSIS — Z952 Presence of prosthetic heart valve: Secondary | ICD-10-CM | POA: Diagnosis not present

## 2014-11-12 DIAGNOSIS — I482 Chronic atrial fibrillation: Secondary | ICD-10-CM | POA: Diagnosis not present

## 2014-11-12 DIAGNOSIS — Z5181 Encounter for therapeutic drug level monitoring: Secondary | ICD-10-CM | POA: Diagnosis not present

## 2014-11-12 DIAGNOSIS — I340 Nonrheumatic mitral (valve) insufficiency: Secondary | ICD-10-CM | POA: Diagnosis not present

## 2014-11-12 DIAGNOSIS — I509 Heart failure, unspecified: Secondary | ICD-10-CM | POA: Diagnosis not present

## 2014-11-15 ENCOUNTER — Other Ambulatory Visit: Payer: Self-pay | Admitting: Neurology

## 2014-11-27 DIAGNOSIS — Z952 Presence of prosthetic heart valve: Secondary | ICD-10-CM | POA: Diagnosis not present

## 2014-11-27 DIAGNOSIS — Z7901 Long term (current) use of anticoagulants: Secondary | ICD-10-CM | POA: Diagnosis not present

## 2014-11-27 DIAGNOSIS — Z5181 Encounter for therapeutic drug level monitoring: Secondary | ICD-10-CM | POA: Diagnosis not present

## 2014-11-28 ENCOUNTER — Ambulatory Visit: Payer: Medicare Other | Admitting: Cardiology

## 2014-12-09 ENCOUNTER — Other Ambulatory Visit: Payer: Self-pay | Admitting: Cardiology

## 2014-12-10 NOTE — Telephone Encounter (Signed)
Looks like patient has relocated to [place]. Please advise on refill. Thanks, MI

## 2014-12-11 DIAGNOSIS — I4891 Unspecified atrial fibrillation: Secondary | ICD-10-CM | POA: Diagnosis not present

## 2014-12-11 DIAGNOSIS — Z7901 Long term (current) use of anticoagulants: Secondary | ICD-10-CM | POA: Diagnosis not present

## 2014-12-11 DIAGNOSIS — Z5181 Encounter for therapeutic drug level monitoring: Secondary | ICD-10-CM | POA: Diagnosis not present

## 2014-12-23 ENCOUNTER — Other Ambulatory Visit: Payer: Self-pay | Admitting: Neurology

## 2014-12-24 DIAGNOSIS — I10 Essential (primary) hypertension: Secondary | ICD-10-CM | POA: Diagnosis not present

## 2014-12-24 DIAGNOSIS — I482 Chronic atrial fibrillation: Secondary | ICD-10-CM | POA: Diagnosis not present

## 2014-12-24 DIAGNOSIS — Z952 Presence of prosthetic heart valve: Secondary | ICD-10-CM | POA: Diagnosis not present

## 2014-12-24 DIAGNOSIS — Z5181 Encounter for therapeutic drug level monitoring: Secondary | ICD-10-CM | POA: Diagnosis not present

## 2014-12-24 DIAGNOSIS — I509 Heart failure, unspecified: Secondary | ICD-10-CM | POA: Diagnosis not present

## 2015-01-30 ENCOUNTER — Telehealth: Payer: Self-pay | Admitting: Neurology

## 2015-01-30 ENCOUNTER — Ambulatory Visit: Payer: Medicare Other | Admitting: Neurology

## 2015-01-30 NOTE — Telephone Encounter (Signed)
Called about med/dosage/ of Doneplil//  if she missed a dose of med how long should she wait for the next dose/and can he recommend another Neuro Dr. in the [place],where she now resides?// [phone]

## 2015-01-31 NOTE — Telephone Encounter (Signed)
She can continue aricept (donepezil) 10mg  daily today, no need to wait any interval of time.  I will defer to Dr. Everlena CooperJaffe if he knows of a neurologist in the [place] area.

## 2015-01-31 NOTE — Telephone Encounter (Signed)
Please advise 

## 2015-01-31 NOTE — Telephone Encounter (Signed)
Left message giving patient instructions. 

## 2015-02-03 NOTE — Telephone Encounter (Signed)
Patient notified

## 2015-02-03 NOTE — Telephone Encounter (Signed)
Unfortunately, I am not familiar with any neurologists in [place].

## 2015-02-23 ENCOUNTER — Other Ambulatory Visit: Payer: Self-pay | Admitting: Cardiology

## 2015-04-26 ENCOUNTER — Other Ambulatory Visit: Payer: Self-pay | Admitting: Neurology

## 2015-04-28 NOTE — Telephone Encounter (Signed)
Last OV: 07/29/14 Next OV:   Will send Mychart message to have pt schedule.

## 2015-07-07 ENCOUNTER — Other Ambulatory Visit: Payer: Self-pay | Admitting: Neurology

## 2015-08-07 ENCOUNTER — Other Ambulatory Visit: Payer: Self-pay | Admitting: Cardiology

## 2015-08-08 NOTE — Telephone Encounter (Signed)
[place] moved to [place] for awhile and cancelled her follow-up appt with Dr Delton SeeNelson on 11/2014 and stated she would call back to r/s once [place].  Pt never called back to reschedule her follow-up appt.  Will need to route this refill request to Dr Delton SeeNelson to advise on, but the pt will more than likely need to schedule a follow-up appt with Dr Delton SeeNelson to maintain continuity of care, and to receive refills on her cardiac meds.

## 2015-08-15 IMAGING — CT CT HEAD W/O CM
1 of 2 series · 15 of 30 positions shown, 19 images · non-contrast
Comparison: Head CT without contrast 06/02/2014.

CLINICAL DATA: 63-year-old female with altered mental status,
confusion. Initial encounter.

EXAM:
CT HEAD WITHOUT CONTRAST
TECHNIQUE: Contiguous axial images were obtained from the base of the skull
through the vertex without intravenous contrast.

[Series 4: head 2.0 h70h · axial · 0.47mm/px · z∈[+1052,+1196]mm · 15 of 82 slices shown, 19 images]
[im 5/82  brain]
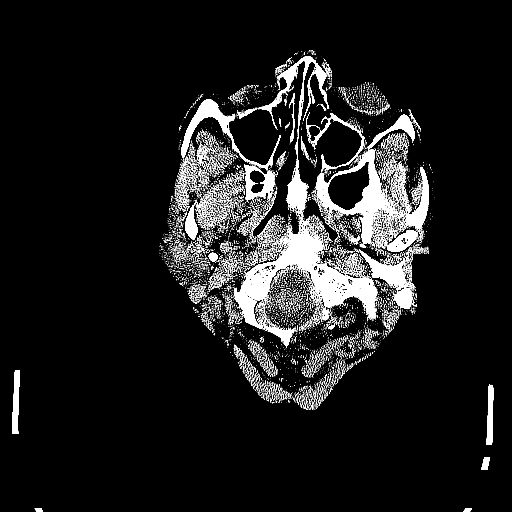
[im 5/82  bone]
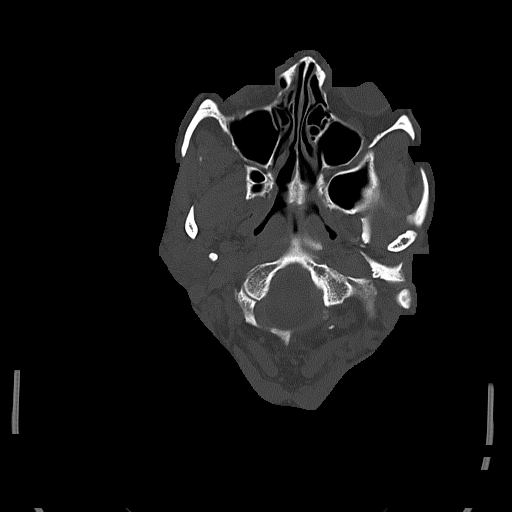
[im 9/82  brain]
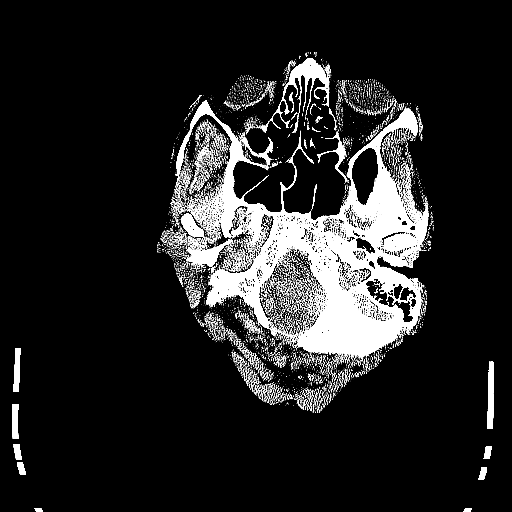
[im 17/82  brain]
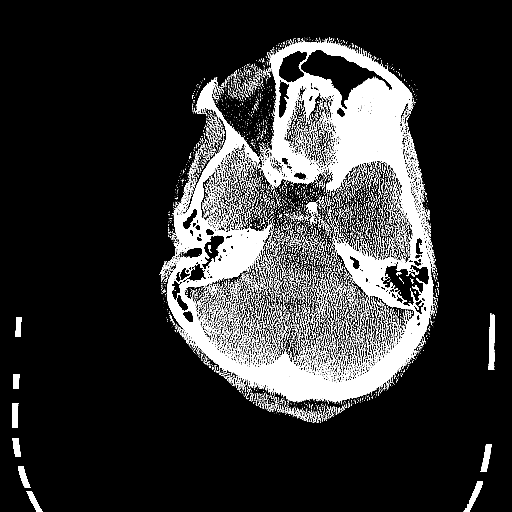
[im 21/82  brain]
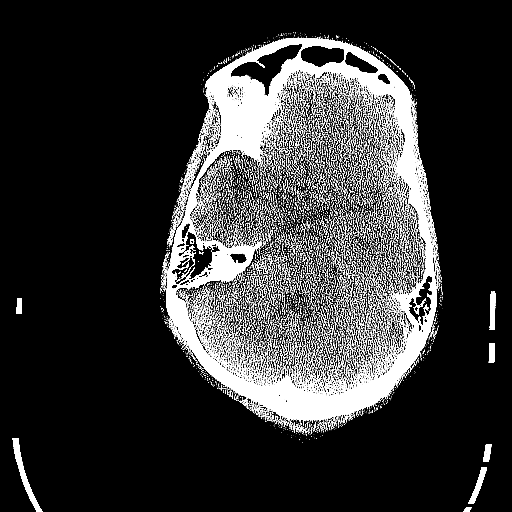
[im 25/82  brain]
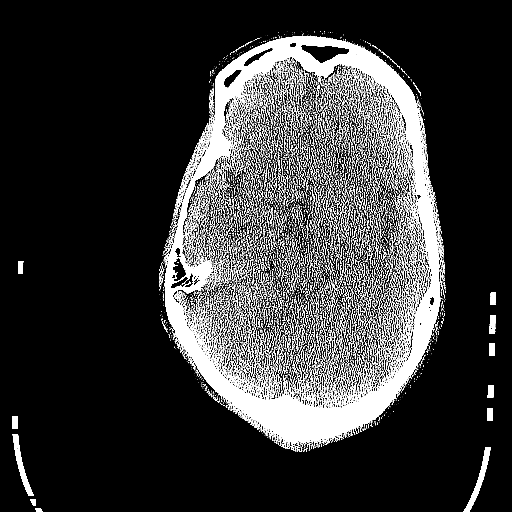
[im 25/82  bone]
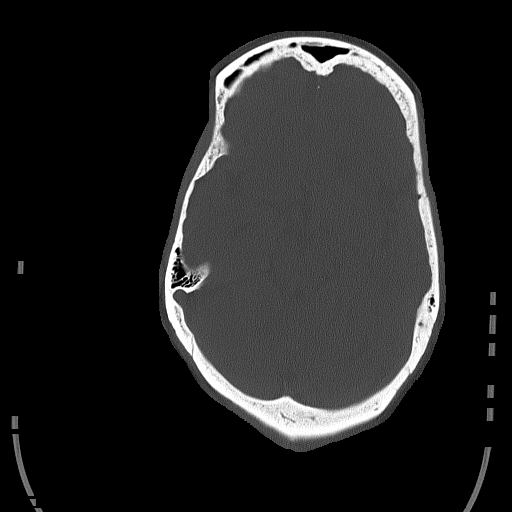
[im 29/82  brain]
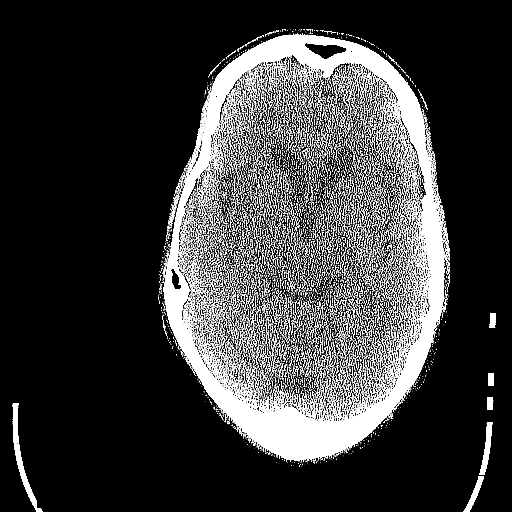
[im 37/82  brain]
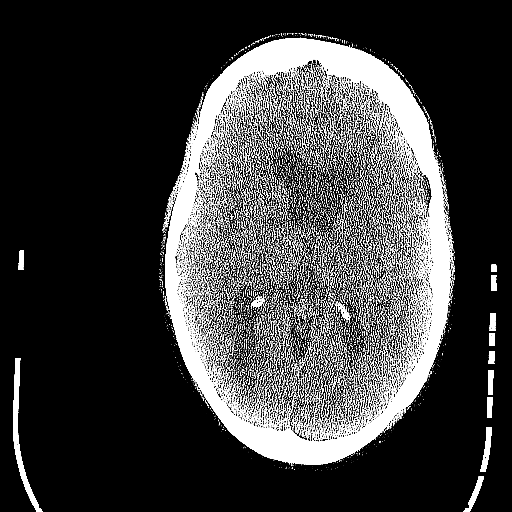
[im 41/82  brain]
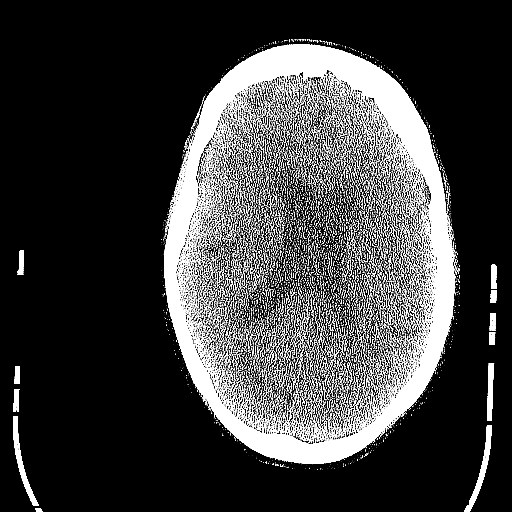
[im 45/82  brain]
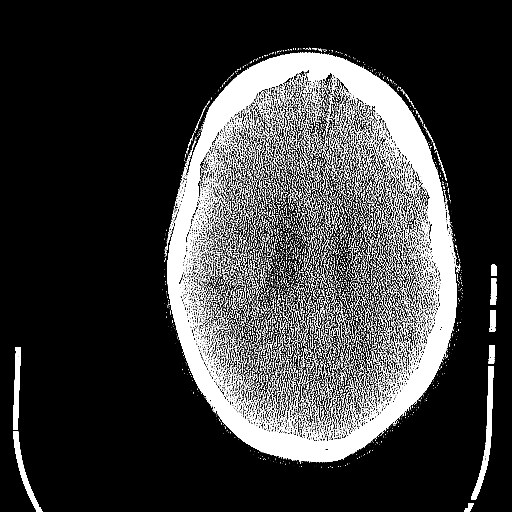
[im 45/82  bone]
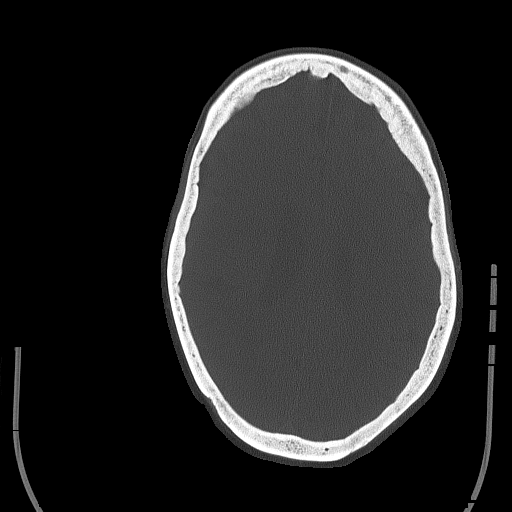
[im 53/82  brain]
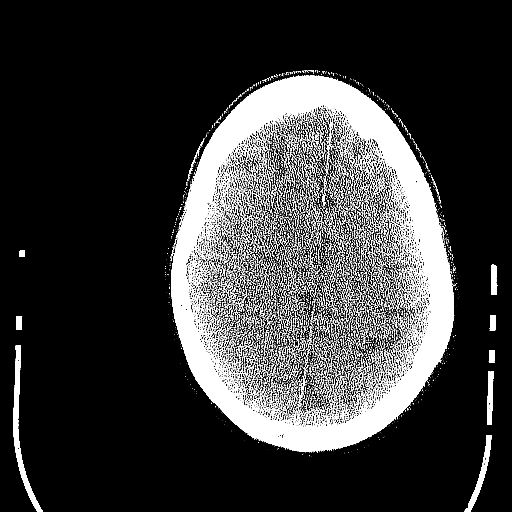
[im 57/82  brain]
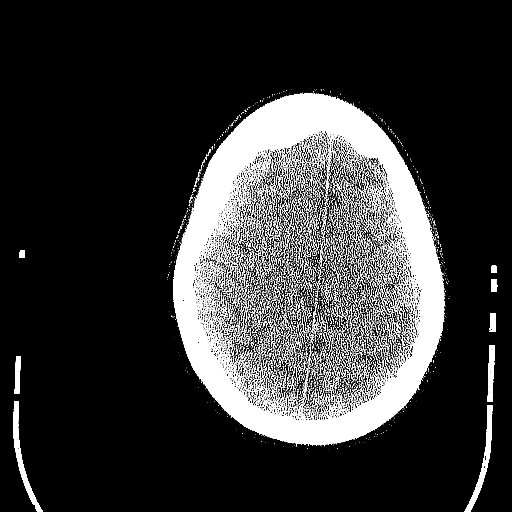
[im 61/82  brain]
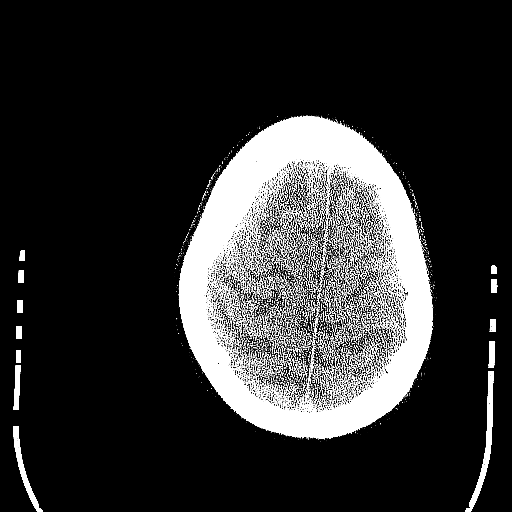
[im 65/82  brain]
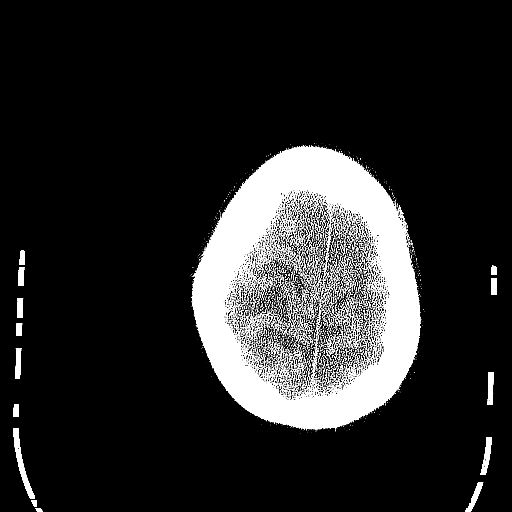
[im 65/82  bone]
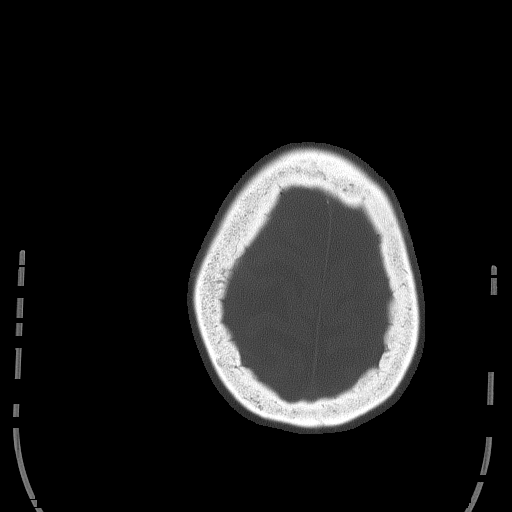
[im 73/82  brain]
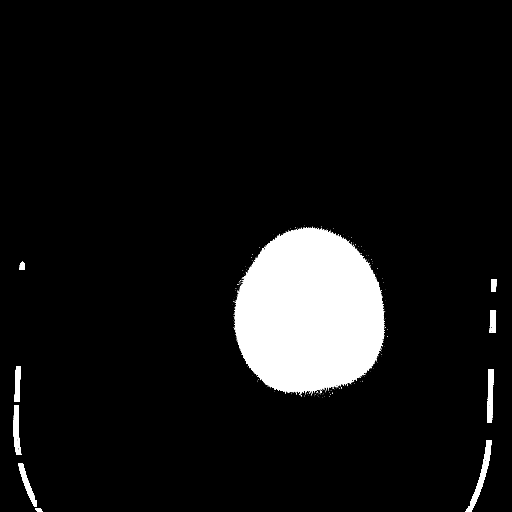
[im 77/82  brain]
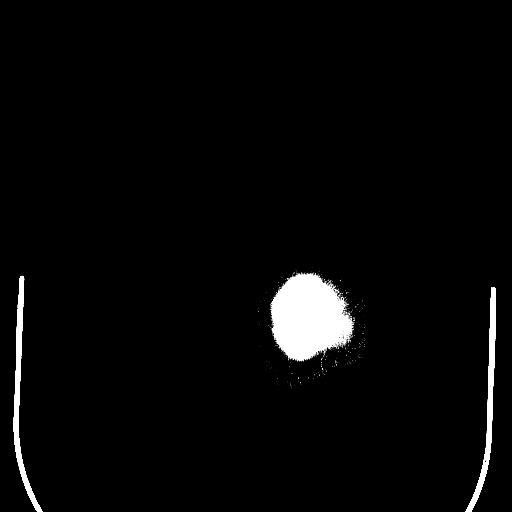

[15 of 30 positions shown; findings below may reference images not displayed]

FINDINGS: Visualized paranasal sinuses and mastoids are clear. No acute
osseous abnormality identified. Dysconjugate gaze, otherwise
negative orbits soft tissues.

Negative scalp soft tissues except for superficial soft tissue
irregularity at the left vertex seen on series 4, image 65.

Calcified atherosclerosis at the skull base. Stable right greater
than left PCA territory encephalomalacia which appears to be
chronic. Small chronic appearing cerebellar lacunar infarcts re-
identified. Moderate patchy white matter hypodensity elsewhere is
nonspecific. No ventriculomegaly. No midline shift, mass effect, or
evidence of intracranial mass lesion. No suspicious intracranial
vascular hyperdensity. No acute cortically based infarct identified.
No acute intracranial hemorrhage identified.
IMPRESSION: 1.  No acute intracranial abnormality identified.
2. Stable and chronic appearing bilateral PCA and cerebellar
ischemia.
3. Superficial/dermal scalp soft tissue lesion at the left vertex,
see series 4, image 65.

## 2015-08-18 IMAGING — CR DG CHEST 1V PORT
1 series · 1 of 1 positions shown · non-contrast
Comparison: 06/02/2014

CLINICAL DATA: Shortness of breath

EXAM:
PORTABLE CHEST - 1 VIEW

[AP]
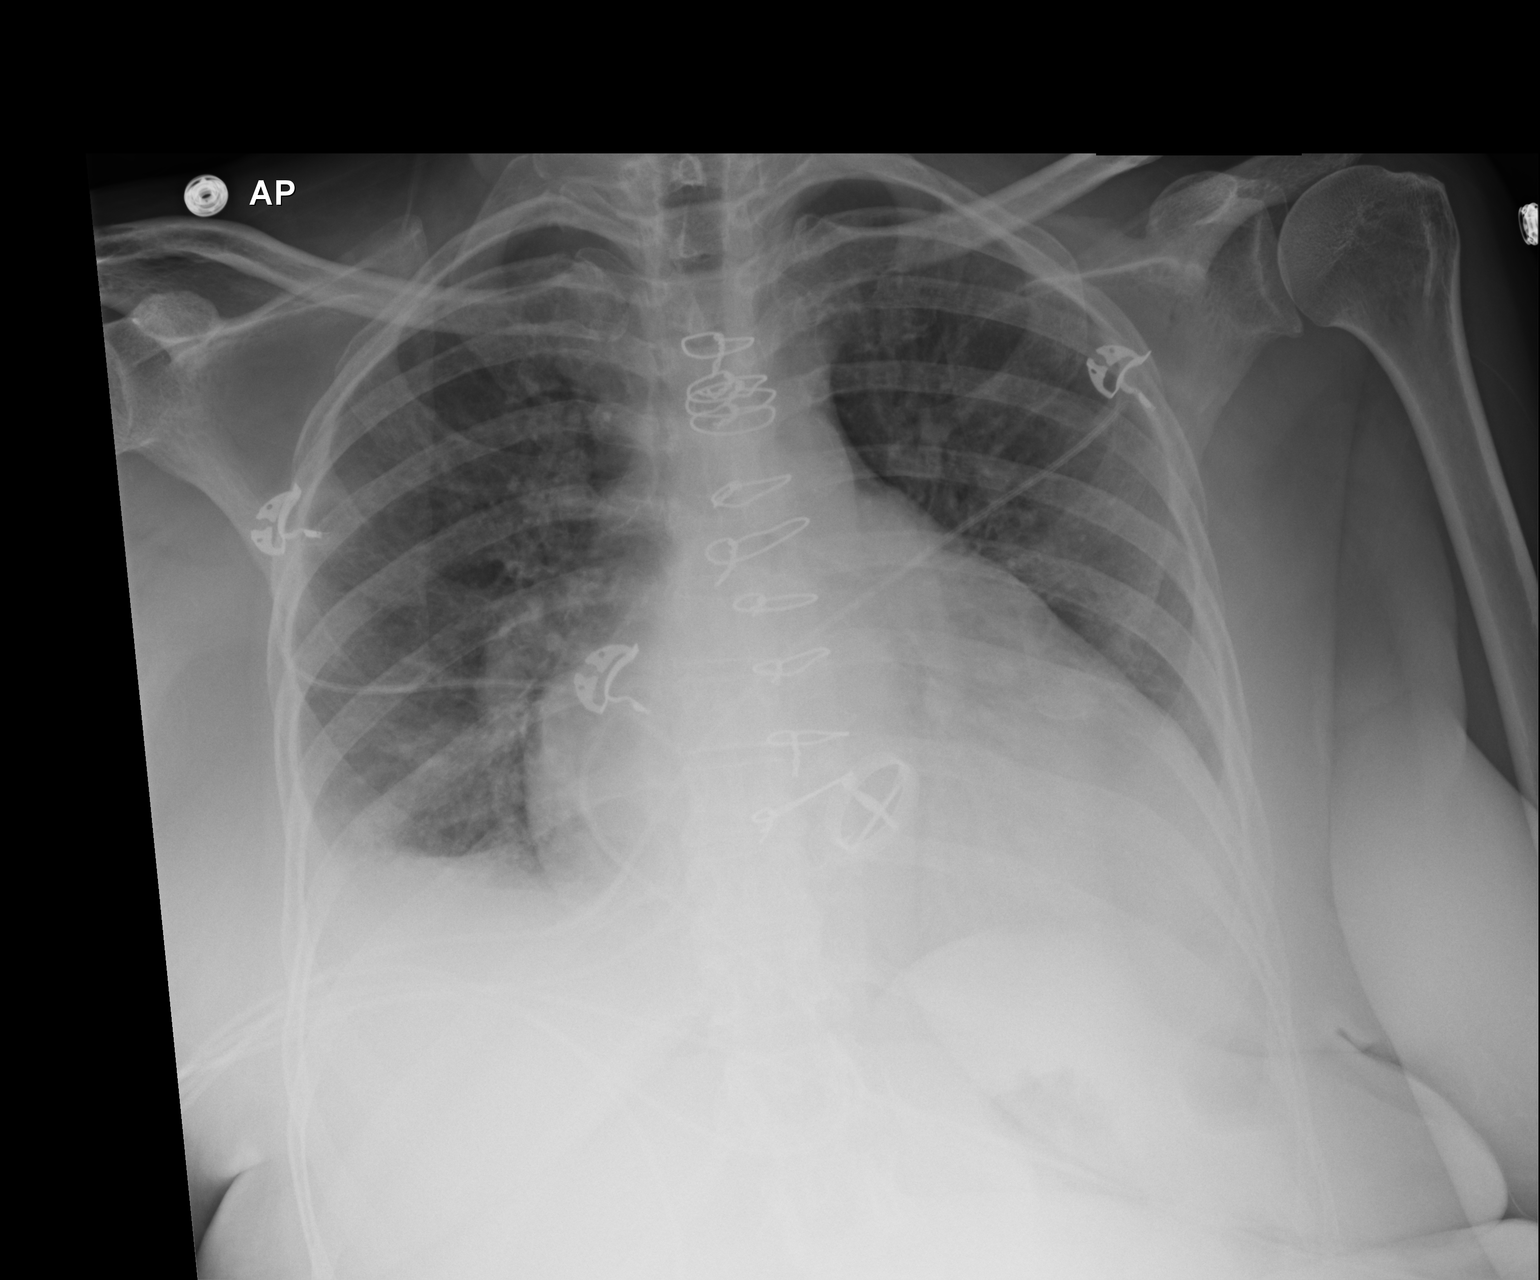

[1 of 1 positions shown; findings below may reference images not displayed]

FINDINGS: Cardiac shadow remains enlarged. Postsurgical changes are again
identified. Mild vascular congestion is again identified and stable.
Persistent right basilar density is seen likely representing
atelectasis. A small effusion is noted as well.
IMPRESSION: Persistent vascular congestion and changes in the right base. No
other focal abnormality is noted.

## 2016-12-31 ENCOUNTER — Encounter: Payer: Self-pay | Admitting: Internal Medicine

## 2021-11-10 DEATH — deceased
